# Patient Record
Sex: Female | Born: 1950 | Race: Black or African American | Hispanic: No | Smoking: Former smoker
Health system: Southern US, Community
[De-identification: ages and names within clinical notes are randomized; demographics above are authoritative.]

## PROBLEM LIST (undated history)

## (undated) DIAGNOSIS — I10 Essential (primary) hypertension: Secondary | ICD-10-CM

## (undated) DIAGNOSIS — R519 Headache, unspecified: Secondary | ICD-10-CM

## (undated) DIAGNOSIS — R51 Headache: Secondary | ICD-10-CM

## (undated) DIAGNOSIS — C719 Malignant neoplasm of brain, unspecified: Secondary | ICD-10-CM

## (undated) DIAGNOSIS — J45909 Unspecified asthma, uncomplicated: Secondary | ICD-10-CM

## (undated) DIAGNOSIS — J42 Unspecified chronic bronchitis: Secondary | ICD-10-CM

## (undated) DIAGNOSIS — R569 Unspecified convulsions: Secondary | ICD-10-CM

## (undated) DIAGNOSIS — Z8489 Family history of other specified conditions: Secondary | ICD-10-CM

## (undated) DIAGNOSIS — E785 Hyperlipidemia, unspecified: Secondary | ICD-10-CM

## (undated) DIAGNOSIS — C50919 Malignant neoplasm of unspecified site of unspecified female breast: Secondary | ICD-10-CM

## (undated) HISTORY — PX: LAPAROSCOPIC CHOLECYSTECTOMY: SUR755

## (undated) HISTORY — PX: BREAST LUMPECTOMY: SHX2

## (undated) HISTORY — PX: APPENDECTOMY: SHX54

## (undated) HISTORY — PX: BREAST BIOPSY: SHX20

## (undated) HISTORY — PX: ABDOMINAL HYSTERECTOMY: SHX81

---

## 1999-07-29 ENCOUNTER — Emergency Department (HOSPITAL_COMMUNITY): Admission: EM | Admit: 1999-07-29 | Discharge: 1999-07-29 | Payer: Self-pay | Admitting: Emergency Medicine

## 1999-07-29 ENCOUNTER — Encounter: Payer: Self-pay | Admitting: Emergency Medicine

## 2000-09-29 ENCOUNTER — Encounter: Payer: Self-pay | Admitting: Emergency Medicine

## 2000-09-29 ENCOUNTER — Emergency Department (HOSPITAL_COMMUNITY): Admission: EM | Admit: 2000-09-29 | Discharge: 2000-09-29 | Payer: Self-pay | Admitting: Emergency Medicine

## 2004-09-21 ENCOUNTER — Emergency Department (HOSPITAL_COMMUNITY): Admission: EM | Admit: 2004-09-21 | Discharge: 2004-09-21 | Payer: Self-pay | Admitting: Emergency Medicine

## 2007-02-05 ENCOUNTER — Ambulatory Visit: Payer: Self-pay | Admitting: Internal Medicine

## 2007-02-16 ENCOUNTER — Emergency Department (HOSPITAL_COMMUNITY): Admission: EM | Admit: 2007-02-16 | Discharge: 2007-02-17 | Payer: Self-pay | Admitting: Podiatry

## 2007-02-19 ENCOUNTER — Ambulatory Visit: Payer: Self-pay | Admitting: Internal Medicine

## 2007-02-22 ENCOUNTER — Ambulatory Visit: Payer: Self-pay | Admitting: *Deleted

## 2007-03-04 DIAGNOSIS — J4489 Other specified chronic obstructive pulmonary disease: Secondary | ICD-10-CM | POA: Insufficient documentation

## 2007-03-04 DIAGNOSIS — J309 Allergic rhinitis, unspecified: Secondary | ICD-10-CM | POA: Insufficient documentation

## 2007-03-04 DIAGNOSIS — J449 Chronic obstructive pulmonary disease, unspecified: Secondary | ICD-10-CM

## 2007-03-04 DIAGNOSIS — I10 Essential (primary) hypertension: Secondary | ICD-10-CM

## 2007-03-05 ENCOUNTER — Ambulatory Visit: Payer: Self-pay | Admitting: Internal Medicine

## 2007-03-05 ENCOUNTER — Encounter (INDEPENDENT_AMBULATORY_CARE_PROVIDER_SITE_OTHER): Payer: Self-pay | Admitting: Family Medicine

## 2007-04-04 ENCOUNTER — Ambulatory Visit: Payer: Self-pay | Admitting: Internal Medicine

## 2007-04-04 LAB — CONVERTED CEMR LAB
ALT: 12 units/L (ref 0–35)
Albumin: 4.5 g/dL (ref 3.5–5.2)
Basophils Absolute: 0 10*3/uL (ref 0.0–0.1)
CO2: 24 meq/L (ref 19–32)
Calcium: 9.7 mg/dL (ref 8.4–10.5)
Chloride: 106 meq/L (ref 96–112)
Cholesterol: 185 mg/dL (ref 0–200)
Lymphocytes Relative: 49 % — ABNORMAL HIGH (ref 12–46)
Lymphs Abs: 2.5 10*3/uL (ref 0.7–4.0)
Neutro Abs: 2.1 10*3/uL (ref 1.7–7.7)
Neutrophils Relative %: 42 % — ABNORMAL LOW (ref 43–77)
Platelets: 230 10*3/uL (ref 150–400)
Potassium: 4.4 meq/L (ref 3.5–5.3)
RDW: 15.5 % (ref 11.5–15.5)
Sodium: 142 meq/L (ref 135–145)
Total Bilirubin: 0.3 mg/dL (ref 0.3–1.2)
Total Protein: 7.5 g/dL (ref 6.0–8.3)
VLDL: 16 mg/dL (ref 0–40)
WBC: 5.1 10*3/uL (ref 4.0–10.5)

## 2007-04-09 ENCOUNTER — Ambulatory Visit: Payer: Self-pay | Admitting: Internal Medicine

## 2007-04-09 ENCOUNTER — Encounter (INDEPENDENT_AMBULATORY_CARE_PROVIDER_SITE_OTHER): Payer: Self-pay | Admitting: Family Medicine

## 2007-05-02 ENCOUNTER — Ambulatory Visit: Payer: Self-pay | Admitting: Family Medicine

## 2007-05-02 ENCOUNTER — Encounter (INDEPENDENT_AMBULATORY_CARE_PROVIDER_SITE_OTHER): Payer: Self-pay | Admitting: Nurse Practitioner

## 2007-05-02 LAB — CONVERTED CEMR LAB
AST: 16 units/L (ref 0–37)
Albumin: 4.7 g/dL (ref 3.5–5.2)
Alkaline Phosphatase: 107 units/L (ref 39–117)
Chloride: 106 meq/L (ref 96–112)
Glucose, Bld: 91 mg/dL (ref 70–99)
Potassium: 4.1 meq/L (ref 3.5–5.3)
Sodium: 141 meq/L (ref 135–145)
Total Protein: 8.3 g/dL (ref 6.0–8.3)

## 2007-07-06 ENCOUNTER — Ambulatory Visit: Payer: Self-pay | Admitting: Internal Medicine

## 2007-07-06 DIAGNOSIS — K219 Gastro-esophageal reflux disease without esophagitis: Secondary | ICD-10-CM | POA: Insufficient documentation

## 2007-10-18 ENCOUNTER — Ambulatory Visit: Payer: Self-pay | Admitting: Family Medicine

## 2008-02-12 ENCOUNTER — Ambulatory Visit: Payer: Self-pay | Admitting: Family Medicine

## 2008-02-12 DIAGNOSIS — Z853 Personal history of malignant neoplasm of breast: Secondary | ICD-10-CM

## 2008-02-12 DIAGNOSIS — F17200 Nicotine dependence, unspecified, uncomplicated: Secondary | ICD-10-CM

## 2008-02-14 ENCOUNTER — Telehealth (INDEPENDENT_AMBULATORY_CARE_PROVIDER_SITE_OTHER): Payer: Self-pay | Admitting: *Deleted

## 2008-02-14 LAB — CONVERTED CEMR LAB
ALT: 10 units/L (ref 0–35)
AST: 17 units/L (ref 0–37)
Albumin: 4.3 g/dL (ref 3.5–5.2)
Basophils Absolute: 0 10*3/uL (ref 0.0–0.1)
Basophils Relative: 0 % (ref 0–1)
Calcium: 9.6 mg/dL (ref 8.4–10.5)
Chloride: 104 meq/L (ref 96–112)
MCHC: 33 g/dL (ref 30.0–36.0)
Neutro Abs: 1.7 10*3/uL (ref 1.7–7.7)
Neutrophils Relative %: 26 % — ABNORMAL LOW (ref 43–77)
Potassium: 4.2 meq/L (ref 3.5–5.3)
RBC: 4.6 M/uL (ref 3.87–5.11)
RDW: 14.7 % (ref 11.5–15.5)
TSH: 1.537 microintl units/mL (ref 0.350–4.50)

## 2008-02-15 ENCOUNTER — Ambulatory Visit (HOSPITAL_COMMUNITY): Admission: RE | Admit: 2008-02-15 | Discharge: 2008-02-15 | Payer: Self-pay | Admitting: Family Medicine

## 2008-02-22 ENCOUNTER — Encounter: Admission: RE | Admit: 2008-02-22 | Discharge: 2008-02-22 | Payer: Self-pay | Admitting: Family Medicine

## 2008-02-28 ENCOUNTER — Encounter (INDEPENDENT_AMBULATORY_CARE_PROVIDER_SITE_OTHER): Payer: Self-pay | Admitting: Family Medicine

## 2008-04-17 ENCOUNTER — Ambulatory Visit: Payer: Self-pay | Admitting: Family Medicine

## 2008-04-17 ENCOUNTER — Encounter (INDEPENDENT_AMBULATORY_CARE_PROVIDER_SITE_OTHER): Payer: Self-pay | Admitting: Family Medicine

## 2008-04-17 DIAGNOSIS — E785 Hyperlipidemia, unspecified: Secondary | ICD-10-CM

## 2008-04-17 LAB — CONVERTED CEMR LAB
Blood in Urine, dipstick: NEGATIVE
Glucose, Urine, Semiquant: NEGATIVE
Ketones, urine, test strip: NEGATIVE
Specific Gravity, Urine: >=1.03
WBC Urine, dipstick: NEGATIVE
pH: 5.5

## 2008-04-18 ENCOUNTER — Telehealth (INDEPENDENT_AMBULATORY_CARE_PROVIDER_SITE_OTHER): Payer: Self-pay | Admitting: Family Medicine

## 2008-04-18 LAB — CONVERTED CEMR LAB
ALT: 10 units/L (ref 0–35)
AST: 17 units/L (ref 0–37)
Alkaline Phosphatase: 72 units/L (ref 39–117)
Calcium: 9.5 mg/dL (ref 8.4–10.5)
Chloride: 107 meq/L (ref 96–112)
Creatinine, Ser: 0.73 mg/dL (ref 0.40–1.20)
LDL Cholesterol: 117 mg/dL — ABNORMAL HIGH (ref 0–99)
Potassium: 4 meq/L (ref 3.5–5.3)
Total CHOL/HDL Ratio: 4.2
VLDL: 19 mg/dL (ref 0–40)

## 2008-05-05 ENCOUNTER — Encounter (INDEPENDENT_AMBULATORY_CARE_PROVIDER_SITE_OTHER): Payer: Self-pay | Admitting: Family Medicine

## 2008-07-03 ENCOUNTER — Ambulatory Visit: Payer: Self-pay | Admitting: Family Medicine

## 2008-07-03 LAB — CONVERTED CEMR LAB
ALT: 10 units/L (ref 0–35)
AST: 15 units/L (ref 0–37)
Alkaline Phosphatase: 67 units/L (ref 39–117)
Cholesterol: 177 mg/dL (ref 0–200)
Indirect Bilirubin: 0.3 mg/dL (ref 0.0–0.9)
Total Protein: 7.8 g/dL (ref 6.0–8.3)
Triglycerides: 81 mg/dL (ref <150)
VLDL: 16 mg/dL (ref 0–40)

## 2008-07-04 ENCOUNTER — Encounter (INDEPENDENT_AMBULATORY_CARE_PROVIDER_SITE_OTHER): Payer: Self-pay | Admitting: Internal Medicine

## 2008-07-26 ENCOUNTER — Emergency Department (HOSPITAL_COMMUNITY): Admission: EM | Admit: 2008-07-26 | Discharge: 2008-07-26 | Payer: Self-pay | Admitting: Family Medicine

## 2008-08-11 ENCOUNTER — Telehealth (INDEPENDENT_AMBULATORY_CARE_PROVIDER_SITE_OTHER): Payer: Self-pay | Admitting: Family Medicine

## 2008-11-01 ENCOUNTER — Emergency Department (HOSPITAL_COMMUNITY): Admission: EM | Admit: 2008-11-01 | Discharge: 2008-11-01 | Payer: Self-pay | Admitting: Family Medicine

## 2009-03-03 ENCOUNTER — Encounter: Admission: RE | Admit: 2009-03-03 | Discharge: 2009-03-03 | Payer: Self-pay | Admitting: Internal Medicine

## 2009-05-04 ENCOUNTER — Ambulatory Visit: Payer: Self-pay | Admitting: Physician Assistant

## 2009-05-06 ENCOUNTER — Ambulatory Visit: Payer: Self-pay | Admitting: Physician Assistant

## 2009-05-15 ENCOUNTER — Ambulatory Visit: Payer: Self-pay | Admitting: Physician Assistant

## 2009-05-15 LAB — CONVERTED CEMR LAB
AST: 18 units/L (ref 0–37)
Albumin: 4.5 g/dL (ref 3.5–5.2)
Alkaline Phosphatase: 81 units/L (ref 39–117)
BUN: 13 mg/dL (ref 6–23)
Basophils Absolute: 0 10*3/uL (ref 0.0–0.1)
Basophils Relative: 1 % (ref 0–1)
Lymphocytes Relative: 64 % — ABNORMAL HIGH (ref 12–46)
MCHC: 31.7 g/dL (ref 30.0–36.0)
Neutro Abs: 1.5 10*3/uL — ABNORMAL LOW (ref 1.7–7.7)
Nitrite: NEGATIVE
Platelets: 222 10*3/uL (ref 150–400)
Potassium: 4.6 meq/L (ref 3.5–5.3)
RDW: 15.2 % (ref 11.5–15.5)
Sodium: 138 meq/L (ref 135–145)
Specific Gravity, Urine: 1.02
Total Bilirubin: 0.3 mg/dL (ref 0.3–1.2)
Total Protein: 7.5 g/dL (ref 6.0–8.3)
Urobilinogen, UA: 0.2
WBC Urine, dipstick: NEGATIVE
pH: 6

## 2009-05-18 ENCOUNTER — Encounter: Payer: Self-pay | Admitting: Physician Assistant

## 2009-08-19 ENCOUNTER — Telehealth: Payer: Self-pay | Admitting: Physician Assistant

## 2009-11-16 ENCOUNTER — Ambulatory Visit: Payer: Self-pay | Admitting: Physician Assistant

## 2009-11-17 ENCOUNTER — Encounter: Payer: Self-pay | Admitting: Physician Assistant

## 2009-11-17 LAB — CONVERTED CEMR LAB
Albumin: 4.5 g/dL (ref 3.5–5.2)
Alkaline Phosphatase: 69 units/L (ref 39–117)
BUN: 15 mg/dL (ref 6–23)
CO2: 26 meq/L (ref 19–32)
Cholesterol: 190 mg/dL (ref 0–200)
Glucose, Bld: 73 mg/dL (ref 70–99)
HDL goal, serum: 40 mg/dL
HDL: 47 mg/dL (ref >39)
LDL Cholesterol: 120 mg/dL — ABNORMAL HIGH (ref 0–99)
Potassium: 4.6 meq/L (ref 3.5–5.3)
Sodium: 141 meq/L (ref 135–145)
Total Protein: 7.4 g/dL (ref 6.0–8.3)
Triglycerides: 113 mg/dL (ref <150)

## 2010-02-21 ENCOUNTER — Encounter: Payer: Self-pay | Admitting: Family Medicine

## 2010-03-02 NOTE — Assessment & Plan Note (Signed)
Summary: HTN; smoking   Vital Signs:  Patient profile:   60 year old female Menstrual status:  postmenopausal Weight:      141.8 pounds BMI:     27.79 Temp:     97.5 degrees F oral Pulse rate:   72 / minute Pulse rhythm:   regular Resp:     16 per minute BP sitting:   106 / 66  (left arm)  Vitals Entered By: Levon Hedger (November 16, 2009 9:55 AM) CC: follow-up visit, Hypertension Management Is Patient Diabetic? No Pain Assessment Patient in pain? no       Does patient need assistance? Functional Status Self care Ambulation Normal     Menstrual Status postmenopausal Last PAP Result NEGATIVE FOR INTRAEPITHELIAL LESIONS OR MALIGNANCY.   Primary Care Jorie Zee:  Tereso Newcomer PA-C  CC:  follow-up visit and Hypertension Management.  History of Present Illness: Here for f/u.  Smoking:  Never tried Wellbutrin.  Still smoking.  HLP:  Needs labs today.  Still taking pravastatin without problems.  Hypertension History:      She complains of headache, but denies chest pain, dyspnea with exertion, and syncope.        Positive major cardiovascular risk factors include female age 35 years old or older, hyperlipidemia, hypertension, and current tobacco user.  Negative major cardiovascular risk factors include negative family history for ischemic heart disease.     Medications Prior to Update: 1)  Aspirin Adult Low Strength 81 Mg Tbec (Aspirin) .... Take One Tablet By Mouth Daily 2)  Lisinopril-Hydrochlorothiazide 10-12.5 Mg Tabs (Lisinopril-Hydrochlorothiazide) .... Take One Tablet By Mouth Daily 3)  Atenolol 50 Mg Tabs (Atenolol) .... Take One Tablet By Mouth Daily 4)  Flonase 50 Mcg/act Susp (Fluticasone Propionate) .... One Squirt in Each Nostril Prn 5)  Pravastatin Sodium 20 Mg Tabs (Pravastatin Sodium) .Marland Kitchen.. 1 Tab By Mouth Daily 6)  Wellbutrin Xl 150 Mg Xr24h-Tab (Bupropion Hcl) .... Take 1 Tablet By Mouth Each Day X 1 Week Then Increase To 2 Tablets (Ok To Substitute  Sr) 7)  Allegra 180 Mg Tabs (Fexofenadine Hcl) .... Take 1 Tablet By Mouth Once A Day As Needed For Allergies  Current Medications (verified): 1)  Aspirin Adult Low Strength 81 Mg Tbec (Aspirin) .... Take One Tablet By Mouth Daily 2)  Lisinopril-Hydrochlorothiazide 10-12.5 Mg Tabs (Lisinopril-Hydrochlorothiazide) .... Take One Tablet By Mouth Daily 3)  Atenolol 50 Mg Tabs (Atenolol) .... Take One Tablet By Mouth Daily 4)  Flonase 50 Mcg/act Susp (Fluticasone Propionate) .... One Squirt in Each Nostril Prn 5)  Pravastatin Sodium 20 Mg Tabs (Pravastatin Sodium) .Marland Kitchen.. 1 Tab By Mouth Daily 6)  Wellbutrin Xl 150 Mg Xr24h-Tab (Bupropion Hcl) .... Take 1 Tablet By Mouth Each Day X 1 Week Then Increase To 2 Tablets (Ok To Substitute Sr) 7)  Allegra 180 Mg Tabs (Fexofenadine Hcl) .... Take 1 Tablet By Mouth Once A Day As Needed For Allergies  Allergies (verified): No Known Drug Allergies  Physical Exam  General:  alert, well-developed, and well-nourished.   Head:  normocephalic and atraumatic.   Neck:  supple.   Lungs:  normal breath sounds.   Heart:  normal rate and regular rhythm.   Abdomen:  soft.   Neurologic:  alert & oriented X3 and cranial nerves II-XII intact.   Psych:  normally interactive.     Impression & Recommendations:  Problem # 1:  HYPERLIPIDEMIA (ICD-272.4)  check FLP today  Her updated medication list for this problem includes:  Pravastatin Sodium 20 Mg Tabs (Pravastatin sodium) .Marland Kitchen... 1 tab by mouth daily  Orders: T-Comprehensive Metabolic Panel [phone]) T-Lipid Profile (09811-91478)  Problem # 2:  HYPERTENSION (ICD-401.9) controlled  Her updated medication list for this problem includes:    Lisinopril-hydrochlorothiazide 10-12.5 Mg Tabs (Lisinopril-hydrochlorothiazide) .Marland Kitchen... Take one tablet by mouth daily    Atenolol 50 Mg Tabs (Atenolol) .Marland Kitchen... Take one tablet by mouth daily  Orders: T-Comprehensive Metabolic Panel ([id number])  Problem # 3:  TOBACCO  ABUSE (ICD-305.1) never tried wellbutrin tried on her own wants to try again but needs RX will [facility]  Complete Medication List: 1)  Aspirin Adult Low Strength 81 Mg Tbec (Aspirin) .... Take one tablet by mouth daily 2)  Lisinopril-hydrochlorothiazide 10-12.5 Mg Tabs (Lisinopril-hydrochlorothiazide) .... Take one tablet by mouth daily 3)  Atenolol 50 Mg Tabs (Atenolol) .... Take one tablet by mouth daily 4)  Flonase 50 Mcg/act Susp (Fluticasone propionate) .... One squirt in each nostril prn 5)  Pravastatin Sodium 20 Mg Tabs (Pravastatin sodium) .Marland Kitchen.. 1 tab by mouth daily 6)  Wellbutrin Xl 150 Mg Xr24h-tab (Bupropion hcl) .... Take 1 tablet by mouth each day x 1 week then increase to 2 tablets (ok to substitute sr) 7)  Allegra 180 Mg Tabs (Fexofenadine hcl) .... Take 1 tablet by mouth once a day as needed for allergies  Other Orders: Flu Vaccine 28yrs + (57846) Admin 1st Vaccine (96295)  Hypertension Assessment/Plan:      The patient's hypertensive risk group is category B: At least one risk factor (excluding diabetes) with no target organ damage.  Her calculated 10 year risk of coronary heart disease is 9 %.  Today's blood pressure is 106/66.  Her blood pressure goal is < 140/90.  Patient Instructions: 1)  Flu shot today. 2)  Schedule CPP in February 2012. 3)  Stop smoking tips: Choose a quit date. Cut down before the quit date. Decide what you will do as a substitute when you feel the urge to smoke(gum, toothpick, exercise).  4)  If you decide to, start the Wellbutrin about 2 months before your quit date.  Continue it for about 3-4 months after you quit.  Remember to taper off the Wellbutrin when you stop:  Take once daily for 3 days, then every other day for 3 doses, then stop. Prescriptions: WELLBUTRIN XL 150 MG XR24H-TAB (BUPROPION HCL) Take 1 tablet by mouth each day x 1 week then increase to 2 tablets (ok to substitute SR)  #60 x 3   Entered and Authorized by:   Tereso Newcomer PA-C   Signed by:   Tereso Newcomer PA-C on 11/16/2009   Method used:   Faxed to ...       [facility] (retail)       [address] [place], [place]  [address]       Ph: [phone]       Fax: [phone]   RxID:   [id number]    Orders Added: 1)  Est. Patient Level III [99213] 2)  T-Comprehensive Metabolic Panel [80053-22900] 3)  T-Lipid Profile [80061-22930] 4)  Flu Vaccine 9yrs + [90658] 5)  Admin 1st Vaccine [18841]   Immunizations Administered:  Influenza Vaccine # 1:    Vaccine Type: Fluvax 3+    Site: left deltoid    Mfr: GlaxoSmithKline    Dose: 0.5 ml    Route: IM    Given by: Levon Hedger  Exp. Date: 07/31/2010    Lot #: [id number]    VIS given: 08/25/09 version given November 16, 2009.  Flu Vaccine Consent Questions:    Do you have a history of severe allergic reactions to this vaccine? no    Any prior history of allergic reactions to egg and/or gelatin? no    Do you have a sensitivity to the preservative Thimersol? no    Do you have a past history of Guillan-Barre Syndrome? no    Do you currently have an acute febrile illness? no    Have you ever had a severe reaction to latex? no    Vaccine information given and explained to patient? yes    Are you currently pregnant? no   ndc  [phone]  Immunizations Administered:  Influenza Vaccine # 1:    Vaccine Type: Fluvax 3+    Site: left deltoid    Mfr: GlaxoSmithKline    Dose: 0.5 ml    Route: IM    Given by: Levon Hedger    Exp. Date: 07/31/2010    Lot #: [id number]    VIS given: 08/25/09 version given November 16, 2009.

## 2010-03-02 NOTE — Assessment & Plan Note (Signed)
Summary: HEALTH ASSESSMENT//SS   Vital Signs:  Patient profile:   60 year old female Height:      60 inches Weight:      142 pounds BMI:     27.83 O2 Sat:      99 % Temp:     97.9 degrees F oral Pulse rate:   76 / minute Pulse rhythm:   regular Resp:     18 per minute BP sitting:   127 / 73  (left arm) Cuff size:   regular  Vitals Entered By: [place] Shannon (May 15, 2009 11:57 AM) CC: Hypertension Management Is Patient Diabetic? No Pain Assessment Patient in pain? no       Does patient need assistance? Functional Status Self care Ambulation Normal  Vision Screening:Left eye w/o correction: 20 / 20 Right Eye w/o correction: 20 / 20-1 Both eyes w/o correction:  20/ 15-2        Vision Entered By: Armenia Shannon (May 15, 2009 12:04 PM)  Hearing Screen  20db HL: Left  500 hz: 40db 1000 hz: 40db 2000 hz: 20db 4000 hz: 20db Right  500 hz: 25db 1000 hz: 25db 2000 hz: 20db 4000 hz: 20db   Hearing Testing Entered By: [place] Shannon (May 15, 2009 12:04 PM)   Primary Care Provider:  Tereso Newcomer PA-C  CC:  Hypertension Management.  History of Present Illness: Here for physical form to be completed for her new job.  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 Previous patient of Dr. Barbaraann Barthel. Denies any complaints. Wants to try Wellbutrin again to help her quit smoking. She had SEs to Chantix. She smokes about 1/2 ppd now.   Hypertension History:      She denies headache, chest pain, dyspnea with exertion, and syncope.        Positive major cardiovascular risk factors include female age 58 years old or older, hyperlipidemia, hypertension, and current tobacco user.  Negative major cardiovascular risk factors include negative family history for ischemic heart disease.    Habits & Providers  Alcohol-Tobacco-Diet     Tobacco Status: current     Cigarette Packs/Day: 0.5  Problems Prior to Update: 1)  Examination, Routine Medical  (ICD-V70.0) 2)   Hyperlipidemia  (ICD-272.4) 3)  Personal History of Malignant Neoplasm of Breast  (ICD-V10.3) 4)  Gerd  (ICD-530.81) 5)  Allergic Rhinitis  (ICD-477.9) 6)  Tobacco Abuse  (ICD-305.1) 7)  COPD  (ICD-496) 8)  Hypertension  (ICD-401.9) 9)  Unspecified Breast Screening  (ICD-V76.10)  Current Medications (verified): 1)  Aspirin Adult Low Strength 81 Mg Tbec (Aspirin) .... Take One Tablet By Mouth Daily 2)  Lisinopril-Hydrochlorothiazide 10-12.5 Mg Tabs (Lisinopril-Hydrochlorothiazide) .... Take One Tablet By Mouth Daily 3)  Atenolol 50 Mg Tabs (Atenolol) .... Take One Tablet By Mouth Daily 4)  Flonase 50 Mcg/act Susp (Fluticasone Propionate) .... One Squirt in Each Nostril Prn 5)  Famotidine 20 Mg Tabs (Famotidine) .... Take 1 Tablet By Mouth Every 12 Hours As Needed For Indigestion/acid Reflux 6)  Pravastatin Sodium 20 Mg Tabs (Pravastatin Sodium) .Marland Kitchen.. 1 Tab By Mouth Daily 7)  Wellbutrin Xl 150 Mg Xr24h-Tab (Bupropion Hcl) .... Take 1 Tablet By Mouth Each Day X 1 Week Then Increase To 2 Tablets (Ok To Substitute Sr) 8)  Allegra 180 Mg Tabs (Fexofenadine Hcl) .... Take 1 Tablet By Mouth Once A Day As Needed For Allergies  Allergies (verified): No Known Drug Allergies  Past History:  Past Medical History: Last updated: 02/12/2008 Hypertension (03/04/2007)  COPD (03/04/2007) GERD (ICD-530.81) ALLERGIC RHINITIS (ICD-477.9) TOBACCO ABUSE (ICD-305.1)  Past Surgical History: Last updated: 02/12/2008 Appendectomy...age 46 yrs Hysterectomy(partial)...1992 for "fibroids" s/p right breast lumpectomy...1991 and took tamoxifen x 5 years. Cholecystectomy... ~1992  Social History: Packs/Day:  0.5  Review of Systems      See HPI GI:  Denies bloody stools. GU:  Denies hematuria.  Physical Exam  General:  alert, well-developed, and well-nourished.   Head:  normocephalic and atraumatic.   Eyes:  pupils equal, pupils round, and pupils reactive to light.   Mouth:  pharynx pink and moist.     tonsils 2+ bilat  Neck:  supple, no thyromegaly, no carotid bruits, and no cervical lymphadenopathy.   Lungs:  normal breath sounds, no crackles, and no wheezes.   Heart:  normal rate, regular rhythm, and no murmur.   Abdomen:  soft and non-tender.   Neurologic:  alert & oriented X3 and cranial nerves II-XII intact.   Psych:  normally interactive.     Impression & Recommendations:  Problem # 1:  TOBACCO ABUSE (ICD-305.1) wants to start back on Wellbutrin to help quit went over quit plan she tried chantix in the past and had side effects  Problem # 2:  HYPERLIPIDEMIA (ICD-272.4)  not fasting get FLP next visit  Her updated medication list for this problem includes:    Pravastatin Sodium 20 Mg Tabs (Pravastatin sodium) .Marland Kitchen... 1 tab by mouth daily  Orders: T-Comprehensive Metabolic Panel ([id number])  Problem # 3:  HYPERTENSION (ICD-401.9)  controlled  Her updated medication list for this problem includes:    Lisinopril-hydrochlorothiazide 10-12.5 Mg Tabs (Lisinopril-hydrochlorothiazide) .Marland Kitchen... Take one tablet by mouth daily    Atenolol 50 Mg Tabs (Atenolol) .Marland Kitchen... Take one tablet by mouth daily  Orders: T-Comprehensive Metabolic Panel [phone]) T-CBC w/Diff ([id number]) T-Urinalysis (01027-25366)  Problem # 4:  EXAMINATION, ROUTINE MEDICAL (ICD-V70.0)  filled out form for school needs CPP in 04/2010 mammo up to date and vaccines are up to date  Orders: T-CBC w/Diff ([id number]) T-HIV Antibody  (Reflex) ([id number]) T-TSH (29518-84166)  Complete Medication List: 1)  Aspirin Adult Low Strength 81 Mg Tbec (Aspirin) .... Take one tablet by mouth daily 2)  Lisinopril-hydrochlorothiazide 10-12.5 Mg Tabs (Lisinopril-hydrochlorothiazide) .... Take one tablet by mouth daily 3)  Atenolol 50 Mg Tabs (Atenolol) .... Take one tablet by mouth daily 4)  Flonase 50 Mcg/act Susp (Fluticasone propionate) .... One squirt in each nostril prn 5)  Pravastatin Sodium 20 Mg Tabs  (Pravastatin sodium) .Marland Kitchen.. 1 tab by mouth daily 6)  Wellbutrin Xl 150 Mg Xr24h-tab (Bupropion hcl) .... Take 1 tablet by mouth each day x 1 week then increase to 2 tablets (ok to substitute sr) 7)  Allegra 180 Mg Tabs (Fexofenadine hcl) .... Take 1 tablet by mouth once a day as needed for allergies  Hypertension Assessment/Plan:      The patient's hypertensive risk group is category B: At least one risk factor (excluding diabetes) with no target organ damage.  Her calculated 10 year risk of coronary heart disease is 15 %.  Today's blood pressure is 127/73.  Her blood pressure goal is < 140/90.   Patient Instructions: 1)  Please schedule a follow-up appointment in 6 months with Dashanti Burr for blood pressure.  Come fasting to your next appointment for labs.  Do not eat or drink anything after midnight the night before except water. 2)  Tobacco is very bad for your health and your loved ones ! You should stop smoking !  3)  Stop smoking tips: Choose a quit date. Cut down before the quit date. Decide what you will do as a substitute when you feel the urge to smoke(gum, toothpick, exercise).  Prescriptions: WELLBUTRIN XL 150 MG XR24H-TAB (BUPROPION HCL) Take 1 tablet by mouth each day x 1 week then increase to 2 tablets (ok to substitute SR)  #60 x 3   Entered and Authorized by:   Tereso Newcomer PA-C   Signed by:   Tereso Newcomer PA-C on 05/15/2009   Method used:   Print then Give to Patient   RxID:   [id number]   Laboratory Results   Urine Tests    Routine Urinalysis   Glucose: negative   (Normal Range: Negative) Bilirubin: negative   (Normal Range: Negative) Ketone: negative   (Normal Range: Negative) Spec. Gravity: 1.020   (Normal Range: 1.003-1.035) Blood: negative   (Normal Range: Negative) pH: 6.0   (Normal Range: 5.0-8.0) Protein: negative   (Normal Range: Negative) Urobilinogen: 0.2   (Normal Range: 0-1) Nitrite: negative   (Normal Range: Negative) Leukocyte Esterace: negative    (Normal Range: Negative)

## 2010-03-02 NOTE — Letter (Signed)
Summary: HEALTH EXAM CERTIFICATE/[employer]  HEALTH EXAM CERTIFICATE/[employer]   Imported By: Arta Bruce 07/15/2009 12:49:23  _____________________________________________________________________  External Attachment:    Type:   Image     Comment:   External Document

## 2010-03-02 NOTE — Progress Notes (Signed)
Summary: Try to increase pravastatin dose  Phone Note Outgoing Call   Summary of Call: CMA to call patient. 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 Initial call taken by: Beverley Fiedler MD,  April 18, 2008 11:44 AM  Follow-up for Phone Call        spoke with pt and informed her of her cholestrol and let her know that she needs to make fasting labs appt  eight weeks after she starts taking the two tabs per day Follow-up by: Armenia Shannon,  April 18, 2008 2:46 PM

## 2010-03-02 NOTE — Letter (Signed)
Summary: *HSN Results Follow up  [facility]  [address] [place], [place] [address]   Phone: [phone]  Fax: [phone]      11/17/2009   [facility] [address] [place], [place]  [address]   Dear  Ms. Rose [name],                            ____S.Drinkard,FNP   ____D. Gore,FNP       ____B. McPherson,MD   ____V. Rankins,MD    ____E. Mulberry,MD    ____N. Daphine Deutscher, FNP  ____D. Reche Dixon, MD    ____K. Philipp Deputy, MD    __x__S. Alben Spittle, PA-C     This letter is to inform you that your recent test(s):  _______Pap Smear    ___x____Lab Test     _______X-ray    ___x____ is within acceptable limits  _______ requires a medication change  _______ requires a follow-up lab visit  _______ requires a follow-up visit with your provider   Comments:  Liver and kidney function is normal.  Your cholesterol numbers are at goal.  Continue all your current medications.       _________________________________________________________ If you have any questions, please contact our office                     Sincerely,  Tereso Newcomer PA-C [facility]

## 2010-03-02 NOTE — Assessment & Plan Note (Signed)
Summary: 2 MONTH FU FOR CPP/////KT   Vital Signs:  Patient profile:   60 year old female Height:      60 inches Weight:      148 pounds BMI:     29.01 Temp:     97.9 degrees F oral Pulse rate:   88 / minute Pulse rhythm:   regular Resp:     20 per minute BP sitting:   120 / 78  (left arm) Cuff size:   regular  Vitals Entered By: Mikey College CMAk (April 17, 2008 9:24 AM)  History of Present Illness: Here for CPP. Had f/u compression views of breast and all benign. Recheck annually. No sexually active in years. s/p partial hysterectomy. Has patch of dry,itchy skin on outside of her right upper arm.Resolves in summer and re-occurs in winter and has done so for years..  Tolerated slow increase in pravastatin worked and patient taking a full pravastatin tablet daily. Patient wants to quit smoking but had bad dreams when taking chantix.  No prior h/o colonoscopy and benefits d/w her.She understands it is not covered by [facility] but desires referral.  Allergies: No Known Drug Allergies  Past History:  Past Medical History:    Hypertension (03/04/2007)    COPD (03/04/2007)    GERD (ICD-530.81)    ALLERGIC RHINITIS (ICD-477.9)    TOBACCO ABUSE (ICD-305.1)     (02/12/2008)  Family History:    No colon cancer  Physical Exam  General:  Well-developed,well-nourished,in no acute distress; alert,appropriate and cooperative throughout examination Head:  Normocephalic and atraumatic without obvious abnormalities. No apparent alopecia or balding. Eyes:  pupils equal, pupils round, pupils reactive to light, and no injection.   Ears:  External ear exam shows no significant lesions or deformities.  Otoscopic examination reveals clear canals, tympanic membranes are intact bilaterally without bulging, retraction, inflammation or discharge. Hearing is grossly normal bilaterally. Nose:  External nasal examination shows no deformity or inflammation. Nasal mucosa are pink and moist without  lesions or exudates. Mouth:  Oral mucosa and oropharynx without lesions or exudates.  Teeth in good repair.pharynx pink and moist, no erythema, and no exudates.   Neck:  supple, no thyromegaly, no carotid bruits, and no cervical lymphadenopathy.   Breasts:  No mass, nodules, thickening, tenderness, bulging, retraction, inflamation, nipple discharge or skin changes noted.   Lungs:  Normal respiratory effort, chest expands symmetrically. Lungs are clear to auscultation, no crackles or wheezes. Heart:  Normal rate and regular rhythm. S1 and S2 normal without gallop, murmur, click, rub or other extra sounds. Abdomen:  Bowel sounds positive,abdomen soft and non-tender without masses, organomegaly or hernias noted. Rectal:  No external abnormalities noted. Normal sphincter tone. No rectal masses or tenderness. Guiac neg/control posit. Genitalia:  Pelvic Exam:        External: normal female genitalia without lesions or masses        Vagina: normal without lesions or masses        Cervix: absent        Adnexa: normal bimanual exam without masses or fullness        Uterus: absent        Pap smear: performed(vaginal wall smear). Msk:  No deformity or scoliosis noted of thoracic or lumbar spine.   Extremities:  No CCE Neurologic:  alert & oriented X3 and cranial nerves II-XII intact grossly.  Skin:  Right Upper arm:4x3 inch patch of dry skin c/w eczema. Cervical Nodes:  No lymphadenopathy noted Axillary Nodes:  No  palpable lymphadenopathy Psych:  Oriented X3, memory intact for recent and remote, normally interactive, good eye contact, not anxious appearing, and not depressed appearing.      Impression & Recommendations:  Problem # 1:  EXAMINATION, ROUTINE MEDICAL (ICD-V70.0) Plans to quit smoking. Has eye screening through [facility]  ~ 1 year ago. Desires colonoscopy referral. Td booster is UTD. Had mammogram. Recent labs d/w patient.  Orders: Thin Prep Pap (52841) Pap Smear, Thin  Prep ( Collection of) (L2440) Hemoccult Guaiac-1 spec.(in office) (10272) Hemoccult Cards -3 specimans (take home) (82270) UA Dipstick w/o Micro (automated)  (81003)  Problem # 2:  HYPERLIPIDEMIA (ICD-272.4)  Her updated medication list for this problem includes:    Pravastatin Sodium 20 Mg Tabs (Pravastatin sodium) .Marland Kitchen... 1 tab by mouth daily  Orders: T-Lipid Profile (53664-40347) T-Comprehensive Metabolic Panel (42595-63875)  Problem # 3:  HYPERTENSION (ICD-401.9) At goal. Her updated medication list for this problem includes:    Lisinopril-hydrochlorothiazide 10-12.5 Mg Tabs (Lisinopril-hydrochlorothiazide) .Marland Kitchen... Take one tablet by mouth daily    Atenolol 50 Mg Tabs (Atenolol) .Marland Kitchen... Take one tablet by mouth daily  Problem # 4:  TOBACCO ABUSE (ICD-305.1) Pt wants to try wellbutrin for tobacco cessation.  Complete Medication List: 1)  Aspirin Adult Low Strength 81 Mg Tbec (Aspirin) .... Take one tablet by mouth daily 2)  Lisinopril-hydrochlorothiazide 10-12.5 Mg Tabs (Lisinopril-hydrochlorothiazide) .... Take one tablet by mouth daily 3)  Atenolol 50 Mg Tabs (Atenolol) .... Take one tablet by mouth daily 4)  Flonase 50 Mcg/act Susp (Fluticasone propionate) .... One squirt in each nostril prn 5)  Famotidine 20 Mg Tabs (Famotidine) .... Take 1 tablet by mouth every 12 hours as needed for indigestion/acid reflux 6)  Pravastatin Sodium 20 Mg Tabs (Pravastatin sodium) .Marland Kitchen.. 1 tab by mouth daily 7)  Wellbutrin Xl 150 Mg Xr24h-tab (Bupropion hcl) .... Take 1 tablet by mouth each day x 1 week then increase to 2 tablets (ok to substitute sr) 8)  Allegra 180 Mg Tabs (Fexofenadine hcl) .... Take 1 tablet by mouth once a day as needed for allergies  Other Orders: Gastroenterology Referral (GI)  Patient Instructions: 1)  Please schedule a follow-up appointment as needed .  Prescriptions: ALLEGRA 180 MG TABS (FEXOFENADINE HCL) Take 1 tablet by mouth once a day as needed for allergies  #30 x  4   Entered and Authorized by:   Beverley Fiedler MD   Signed by:   Beverley Fiedler MD on 04/17/2008   Method used:   Print then Give to Patient   RxID:   [id number] FLONASE 50 MCG/ACT SUSP (FLUTICASONE PROPIONATE) one squirt in each nostril prn  #1 x 5   Entered and Authorized by:   Beverley Fiedler MD   Signed by:   Beverley Fiedler MD on 04/17/2008   Method used:   Print then Give to Patient   RxID:   [id number] WELLBUTRIN XL 150 MG XR24H-TAB (BUPROPION HCL) Take 1 tablet by mouth each day x 1 week then increase to 2 tablets (ok to substitute SR)  #60 x 3   Entered and Authorized by:   Beverley Fiedler MD   Signed by:   Beverley Fiedler MD on 04/17/2008   Method used:   Print then Give to Patient   RxID:   [id number]   Laboratory Results   Urine Tests    Routine Urinalysis   Color: yellow Appearance: Clear Glucose: negative   (Normal Range: Negative) Bilirubin: negative   (Normal Range: Negative) Ketone: negative   (  Normal Range: Negative) Spec. Gravity: >=1.030   (Normal Range: 1.003-1.035) Blood: negative   (Normal Range: Negative) pH: 5.5   (Normal Range: 5.0-8.0) Protein: 30   (Normal Range: Negative) Urobilinogen: 0.2   (Normal Range: 0-1) Nitrite: negative   (Normal Range: Negative) Leukocyte Esterace: negative   (Normal Range: Negative)

## 2010-03-02 NOTE — Progress Notes (Signed)
Summary: Query re refill for Pravastatin  Phone Note Outgoing Call   Summary of Call: She hasn't had an FLP since 07/2008.  Do you want me to refill her pravastatin?  She was in here to see you in April. Initial call taken by: Dutch Quint RN,  August 19, 2009 3:21 PM  Follow-up for Phone Call        will fill for 6 mos she is supposed to come fasting in Oct for FLP with routine f/u visit  Follow-up by: Tereso Newcomer PA-C,  August 19, 2009 4:45 PM

## 2010-03-02 NOTE — Letter (Signed)
Summary: *HSN Results Follow up  [facility]  [address] [place], [place] [address]   Phone: [phone]  Fax: [phone]      05/18/2009   [facility] [address] [place], [place]  [address]   Dear  Ms. Kareena Damon,                            ____S.Drinkard,FNP   ____D. Gore,FNP       ____B. McPherson,MD   ____V. Rankins,MD    ____E. Mulberry,MD    ____N. Daphine Deutscher, FNP  ____D. Reche Dixon, MD    ____K. Philipp Deputy, MD    __x__S. Alben Spittle, PA-C     This letter is to inform you that your recent test(s):  _______Pap Smear    ____x___Lab Test     _______X-ray    ___x____ is within acceptable limits  _______ requires a medication change  _______ requires a follow-up lab visit  _______ requires a follow-up visit with your provider   Comments:       _________________________________________________________ If you have any questions, please contact our office                     Sincerely,  Tereso Newcomer PA-C [facility]

## 2010-03-02 NOTE — Assessment & Plan Note (Signed)
Summary: HTN/////KT   Vital Signs:  Patient Profile:   60 Years Old Female Height:     59.75 inches Weight:      146 pounds BMI:     28.86 BSA:     1.63 Temp:     98.2 degrees F oral Pulse rate:   88 / minute Pulse rhythm:   regular Resp:     20 per minute BP sitting:   140 / 88  (left arm) Cuff size:   regular  Pt. in pain?   no  Vitals Entered By: Gaylyn Cheers RN (February 12, 2008 11:54 AM)              Is Patient Diabetic? No  Does patient need assistance? Functional Status Self care Ambulation Normal     Chief Complaint:  F/U HTN; currently complains of an upset stomach-ate [place] [employer] last night.  History of Present Illness: Here to establish care at [facility] site. Transfer from [facility] [facility] (due to location near her home). Paper chart reviewed and EMR preloaded. Needs refills on antihypertensive meds. Still smokes. No recent PFTs...denies chronic breathing problems or cough. Had been on pravastatin for LDL of 126 but pt says she was told to stop it as LDL wasn't "too bad" and she felt it made her legs cramp... Has occasional indigestion when eats spicy foods but not a frequent problem and pt says she doesn't take the protonix regularly...    Current Allergies: No known allergies   Past Medical History:    Hypertension (03/04/2007)    COPD (03/04/2007)    GERD (ICD-530.81)    ALLERGIC RHINITIS (ICD-477.9)    TOBACCO ABUSE (ICD-305.1)          Past Surgical History:    Appendectomy...age 10 yrs    Hysterectomy(partial)...1992 for "fibroids"    s/p right breast lumpectomy...1991 and took tamoxifen x 5 years.    Cholecystectomy... ~1992   Social History:    Occupation:Previously in administration/finances    Divorced    Current Smoker    Alcohol use-no    Drug use-no   Risk Factors:  Tobacco use:  current    Year started:  80's    Cigarettes:  Yes -- 1/2 pack(s) per day    Counseled to quit/cut down tobacco use:  yes Drug use:  no HIV  high-risk behavior:  no Caffeine use:  2 drinks per day Alcohol use:  no Exercise:  no Seatbelt use:  100 % Sun Exposure:  occasionally  Family History Risk Factors:    Family History of MI in females < 21 years old:  no    Family History of MI in males < 50 years old:  no    Physical Exam  General:     Well-developed,well-nourished,in no acute distress; alert,appropriate and cooperative throughout examination Lungs:     Normal respiratory effort, chest expands symmetrically. Lungs are clear to auscultation, no crackles or wheezes. Heart:     Normal rate and regular rhythm. S1 and S2 normal without gallop, murmur, click, rub or other extra sounds.    Impression & Recommendations:  Problem # 1:  HYPERTENSION (ICD-401.9)  Her updated medication list for this problem includes:    Lisinopril-hydrochlorothiazide 10-12.5 Mg Tabs (Lisinopril-hydrochlorothiazide) .Marland Kitchen... Take one tablet by mouth daily    Atenolol 50 Mg Tabs (Atenolol) .Marland Kitchen... Take one tablet by mouth daily  Orders: T-General Health Panel (CBCD, CMP, TSH) ([address]) T-Lipid Profile (28413-24401)   Problem # 2:  COPD (ICD-496) Recommended tobacco  cessation. Consider baseline PFTs at f/u appt. Pt got H1N1 FLU shot in community. She got seasonal FLU shot today. She reports that she tried chantix in past but it gave her bad dreams...  Problem # 3:  GERD (ICD-530.81) Switch to just intermittent famotidine. The following medications were removed from the medication list:    Protonix 40 Mg Tbec (Pantoprazole sodium) .Marland Kitchen... Take one tablet by mouth daily  Her updated medication list for this problem includes:    Famotidine 20 Mg Tabs (Famotidine) .Marland Kitchen... Take 1 tablet by mouth every 12 hours as needed for indigestion/acid reflux   Problem # 4:  PERSONAL HISTORY OF MALIGNANT NEOPLASM OF BREAST (ICD-V10.3) Mammogram scheduled.  Complete Medication List: 1)  Aspirin Adult Low Strength 81 Mg Tbec (Aspirin) .... Take  one tablet by mouth daily 2)  Lisinopril-hydrochlorothiazide 10-12.5 Mg Tabs (Lisinopril-hydrochlorothiazide) .... Take one tablet by mouth daily 3)  Atenolol 50 Mg Tabs (Atenolol) .... Take one tablet by mouth daily 4)  Flonase 50 Mcg/act Susp (Fluticasone propionate) .... One squirt in each nostril prn 5)  Famotidine 20 Mg Tabs (Famotidine) .... Take 1 tablet by mouth every 12 hours as needed for indigestion/acid reflux  Other Orders: Mammogram (Mammogram) Flu Vaccine 65yrs + (09811) Admin 1st Vaccine (91478)   Patient Instructions: 1)  Please schedule a follow-up appointment in 2 months for CPP with Dr.Jamye Balicki 2)  Got seasonal FLU shot Today   Prescriptions: ATENOLOL 50 MG TABS (ATENOLOL) take one tablet by mouth daily  #30 x 5   Entered and Authorized by:   Beverley Fiedler MD   Signed by:   Beverley Fiedler MD on 02/12/2008   Method used:   Electronically to        [facility] [phone]* (retail)       [address]       [place], [place]  [address]       Ph: [phone]       Fax: [phone]   RxID:   [id number] LISINOPRIL-HYDROCHLOROTHIAZIDE 10-12.5 MG TABS (LISINOPRIL-HYDROCHLOROTHIAZIDE) take one tablet by mouth daily  #30 x 5   Entered and Authorized by:   Beverley Fiedler MD   Signed by:   Beverley Fiedler MD on 02/12/2008   Method used:   Electronically to        [facility] [phone]* (retail)       [address]       [place], [place]  [address]       Ph: [phone]       Fax: [phone]   RxID:   [id number] FAMOTIDINE 20 MG TABS (FAMOTIDINE) Take 1 tablet by mouth every 12 hours as needed for indigestion/acid reflux  #60 x 4   Entered and Authorized by:   Beverley Fiedler MD   Signed by:   Beverley Fiedler MD on 02/12/2008   Method used:   Electronically to        [facility] [phone]* (retail)       [address]       [place], [place]  [address]       Ph: [phone]       Fax: [phone]   RxID:    [id number]    Vital Signs:  Patient Profile:   60 Years Old Female Height:     59.75 inches Weight:      146 pounds BMI:     28.86 BSA:     1.63 Temp:     98.2 degrees F  oral Pulse rate:   88 / minute Pulse rhythm:   regular Resp:     20 per minute BP sitting:   140 / 88 Cuff size:   regular                 Influenza Vaccine    Vaccine Type: Fluvax 3+    Site: left deltoid    Mfr: Sanofi Pasteur    Dose: 0.5 ml    Route: IM    Given by: Gaylyn Cheers RN    Exp. Date: 07/30/2008    Lot #: [id number]    VIS given: 08/24/06 version given February 12, 2008.  Flu Vaccine Consent Questions    Do you have a history of severe allergic reactions to this vaccine? no    Any prior history of allergic reactions to egg and/or gelatin? no    Do you have a sensitivity to the preservative Thimersol? no    Do you have a past history of Guillan-Barre Syndrome? no    Do you currently have an acute febrile illness? no    Have you ever had a severe reaction to latex? no    Vaccine information given and explained to patient? yes    Are you currently pregnant? no

## 2010-04-06 ENCOUNTER — Encounter (INDEPENDENT_AMBULATORY_CARE_PROVIDER_SITE_OTHER): Payer: Self-pay | Admitting: Nurse Practitioner

## 2010-04-07 ENCOUNTER — Encounter (INDEPENDENT_AMBULATORY_CARE_PROVIDER_SITE_OTHER): Payer: Self-pay | Admitting: Nurse Practitioner

## 2010-04-07 ENCOUNTER — Other Ambulatory Visit (HOSPITAL_COMMUNITY)
Admission: RE | Admit: 2010-04-07 | Discharge: 2010-04-07 | Disposition: A | Discharge status: Home / Self Care | Payer: Self-pay | Source: Ambulatory Visit | Attending: Internal Medicine | Admitting: Internal Medicine

## 2010-04-07 ENCOUNTER — Encounter: Payer: Self-pay | Admitting: Nurse Practitioner

## 2010-04-07 ENCOUNTER — Other Ambulatory Visit: Payer: Self-pay | Admitting: Nurse Practitioner

## 2010-04-07 DIAGNOSIS — Z01419 Encounter for gynecological examination (general) (routine) without abnormal findings: Secondary | ICD-10-CM | POA: Insufficient documentation

## 2010-04-07 LAB — CONVERTED CEMR LAB
Basophils Absolute: 0 10*3/uL (ref 0.0–0.1)
Basophils Relative: 1 % (ref 0–1)
Bilirubin Urine: NEGATIVE
Blood in Urine, dipstick: NEGATIVE
Chlamydia, Swab/Urine, PCR: NEGATIVE
Eosinophils Relative: 1 % (ref 0–5)
GC Probe Amp, Urine: NEGATIVE
Glucose, Urine, Semiquant: NEGATIVE
HCT: 38.2 % (ref 36.0–46.0)
KOH Prep: NEGATIVE
Ketones, urine, test strip: NEGATIVE
Lymphocytes Relative: 57 % — ABNORMAL HIGH (ref 12–46)
MCHC: 32.5 g/dL (ref 30.0–36.0)
Monocytes Absolute: 0.4 10*3/uL (ref 0.1–1.0)
Protein, U semiquant: NEGATIVE
RBC: 4.44 M/uL (ref 3.87–5.11)
RDW: 15.1 % (ref 11.5–15.5)
Specific Gravity, Urine: 1.015
WBC: 5.3 10*3/uL (ref 4.0–10.5)

## 2010-04-08 ENCOUNTER — Other Ambulatory Visit (HOSPITAL_COMMUNITY): Payer: Self-pay | Admitting: Internal Medicine

## 2010-04-08 ENCOUNTER — Encounter (INDEPENDENT_AMBULATORY_CARE_PROVIDER_SITE_OTHER): Payer: Self-pay | Admitting: Nurse Practitioner

## 2010-04-08 DIAGNOSIS — Z1231 Encounter for screening mammogram for malignant neoplasm of breast: Secondary | ICD-10-CM

## 2010-04-12 ENCOUNTER — Encounter (INDEPENDENT_AMBULATORY_CARE_PROVIDER_SITE_OTHER): Payer: Self-pay | Admitting: Nurse Practitioner

## 2010-04-13 ENCOUNTER — Ambulatory Visit (HOSPITAL_COMMUNITY): Payer: Self-pay

## 2010-04-13 NOTE — Letter (Signed)
Summary: *HSN Results Follow up  [facility]  [address] [place], [place] [address]   Phone: [phone]  Fax: [phone]      04/08/2010   [facility] [address] [place], [place]  [address]   Dear  Ms. Rose [name],                            ____S.Drinkard,FNP   ____D. Gore,FNP       ____B. McPherson,MD   ____V. Rankins,MD    ____E. Mulberry,MD    _X___N. Daphine Deutscher, FNP  ____D. Reche Dixon, MD    ____K. Philipp Deputy, MD    ____Other     This letter is to inform you that your recent test(s):  ___X____Pap Smear    ___X____Lab Test     _______X-ray    ___X____ is within acceptable limits  _______ requires a medication change  _______ requires a follow-up lab visit  _______ requires a follow-up visit with your provider   Comments: Labs done during recent office visit are normal.  Pap Smear results _________________________.       _________________________________________________________ If you have any questions, please contact our office [phone]                    Sincerely,    Rose Prom FNP [facility]

## 2010-04-13 NOTE — Assessment & Plan Note (Signed)
Summary: Complete Physical Exam   Vital Signs:  Patient profile:   60 year old female Menstrual status:  postmenopausal Weight:      149.0 pounds BMI:     29.20 O2 Sat:      100 % on Room air Temp:     98.0 degrees F oral Pulse rate:   76 / minute Pulse rhythm:   regular Resp:     20 per minute BP sitting:   130 / 82  (left arm) Cuff size:   regular  Vitals Entered By: Levon Hedger (April 07, 2010 10:34 AM)  Nutrition Counseling: Patient's BMI is greater than 25 and therefore counseled on weight management options.  O2 Flow:  Room air CC: CPP, Hypertension Management, Lipid Management, Abdominal Pain Is Patient Diabetic? No Pain Assessment Patient in pain? yes     Location: head Intensity: 9  Does patient need assistance? Functional Status Self care Ambulation Normal  Vision Screening:Left eye w/o correction: 20 / 20-1 Right Eye w/o correction: 20 / 25 Both eyes w/o correction:  20/ 20        Vision Entered By: Levon Hedger (April 07, 2010 11:04 AM)   Primary Care Provider:  Tereso Newcomer PA-C  CC:  CPP, Hypertension Management, Lipid Management, and Abdominal Pain.  History of Present Illness:  Pt into the office for a complete physical exam  PAP - last done 2010. No family history of cervical cancer. No menses since 1992 pt is divorced and has 2 adult children and 1 grandchild  Mammogram - last mammogram was 1 year ago Pt is a breast cancer survivor - lumpectomy  Optho - last eye exam was 2 years ago.  wears reading glasses  Dental - seen at dental clinic  Dyspepsia History:      She has no alarm features of dyspepsia including no history of melena, hematochezia, dysphagia, persistent vomiting, or involuntary weight loss > 5%.  There is a prior history of GERD.  The patient does not have a prior history of documented ulcer disease.  The dominant symptom is not heartburn or acid reflux.  An H-2 blocker medication is not currently being taken.      Hypertension History:      She denies headache, chest pain, and palpitations.  She notes no problems with any antihypertensive medication side effects.        Positive major cardiovascular risk factors include female age 59 years old or older, hyperlipidemia, hypertension, and current tobacco user.  Negative major cardiovascular risk factors include no history of diabetes and negative family history for ischemic heart disease.        Further assessment for target organ damage reveals no history of ASHD, stroke/TIA, or peripheral vascular disease.    Lipid Management History:      Positive NCEP/ATP III risk factors include female age 53 years old or older, current tobacco user, and hypertension.  Negative NCEP/ATP III risk factors include non-diabetic, no family history for ischemic heart disease, no ASHD (atherosclerotic heart disease), no prior stroke/TIA, no peripheral vascular disease, and no history of aortic aneurysm.        The patient states that she knows about the "Therapeutic Lifestyle Change" diet.  The patient does not know about adjunctive measures for cholesterol lowering.  She expresses no side effects from her lipid-lowering medication.  The patient denies any symptoms to suggest myopathy or liver disease.      Habits & Providers  Alcohol-Tobacco-Diet  Alcohol drinks/day: 0     Tobacco Status: current     Tobacco Counseling: to quit use of tobacco products     Cigarette Packs/Day: 0.5     Year Started: 80's  Exercise-Depression-Behavior     Does Patient Exercise: no     Have you felt down or hopeless? no     Have you felt little pleasure in things? no     Depression Counseling: not indicated; screening negative for depression     Drug Use: no     Seat Belt Use: 100     Sun Exposure: occasionally  Comments: Pt would like to quit smoking - she has taken chantix in the past but it gave her vivid dreams.  pt was stated on wellbutrin but she was not able to afford.   She has also gone to smoking cessation classes  Allergies (verified): No Known Drug Allergies  Review of Systems General:  Denies fever. Eyes:  Denies blurring. ENT:  Denies earache. CV:  Denies fatigue. Resp:  Denies cough. GI:  Denies abdominal pain. GU:  Denies discharge. MS:  Denies joint pain. Derm:  Denies dryness. Neuro:  Denies headaches. Psych:  Denies depression. Endo:  Denies excessive urination.  Physical Exam  General:  alert.   Head:  normocephalic.   Eyes:  pupils round.   Ears:  R ear normal and L ear normal.  bil TM with bony landmarks present Nose:  no nasal discharge.   Mouth:  fair dentition.   Neck:  supple.   Chest Wall:  no mass.   Breasts:  right upper outer breast s/p lumpectomy and well healed scar present left breast - nontender, no masses Lungs:  decreased bases Heart:  normal rate and regular rhythm.   Abdomen:  soft, non-tender, and normal bowel sounds.   Rectal:  no external abnormalities.   Msk:  normal ROM.   Pulses:  R radial normal and L radial normal.   Extremities:  no edema Neurologic:  alert & oriented X3.   Skin:  color normal.   Psych:  Oriented X3.    Pelvic Exam  Vulva:      normal appearance.   Urethra and Bladder:      Urethra--normal.  Bladder--normal.   Vagina:      physiologic discharge.   Cervix:      absent Adnexa:      nontender bilaterally.   Rectum:      normal, heme negative stool.      Impression & Recommendations:  Problem # 1:  EXAMINATION, ROUTINE MEDICAL (ICD-V70.0) rec optho and dental exam PAP done - vaginal  Orders: Vision Screening (07371) KOH/ WET Mount [phone]) Pap Smear, Thin Prep ( Collection of) (S8546) Rapid HIV  (92370) T- GC Chlamydia (27035)  Problem # 2:  TOBACCO ABUSE (ICD-305.1) advised cessation - pt wants to quit will order buproprion  Problem # 3:  HYPERTENSION (ICD-401.9)  Her updated medication list for this problem includes:    Lisinopril-hydrochlorothiazide  10-12.5 Mg Tabs (Lisinopril-hydrochlorothiazide) .Marland Kitchen... Take one tablet by mouth daily    Atenolol 50 Mg Tabs (Atenolol) .Marland Kitchen... Take one tablet by mouth daily  Orders: UA Dipstick w/o Micro (manual) (00938) EKG w/ Interpretation (93000) T-Urine Microalbumin w/creat. ratio [id number]) T-TSH (38101-75102)  Problem # 4:  HYPERLIPIDEMIA (ICD-272.4)  Her updated medication list for this problem includes:    Pravastatin Sodium 20 Mg Tabs (Pravastatin sodium) .Marland Kitchen... 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 Take one tablet by mouth daily 2)  Lisinopril-hydrochlorothiazide 10-12.5 Mg Tabs (Lisinopril-hydrochlorothiazide) .... Take one tablet by mouth daily 3)  Atenolol 50 Mg Tabs (Atenolol) .... Take one tablet by mouth daily 4)  Rhinocort Aqua 32 Mcg/act Susp (Budesonide) .... One spray in each nostril two times a day ((hold head down) 5)  Pravastatin Sodium 20 Mg Tabs (Pravastatin sodium) .Marland Kitchen.. 1 tab by mouth daily 6)  Bupropion Hcl 150 Mg Xr12h-tab (Bupropion hcl) .... One tablet by mouth two times a day 7)  Loratadine 10 Mg Tabs (Loratadine) .... One tablet by mouth daily for allergies  Other Orders: Mammogram (Screening) (Mammo) T-CBC w/Diff (16109-60454)  Hypertension Assessment/Plan:      The patient's hypertensive risk group is category B: At least one risk factor (excluding diabetes) with no target organ damage.  Her calculated 10 year risk of coronary heart disease is 13 %.  Today's blood pressure is 130/82.  Her blood pressure goal is < 140/90.  Lipid Assessment/Plan:      Based on NCEP/ATP III, the patient's risk factor category is "2 or more risk factors and a calculated 10 year CAD risk of < 20%".  The patient's lipid goals are as follows: Total cholesterol goal is 200; LDL  cholesterol goal is 130; HDL cholesterol goal is 40; Triglyceride goal is 150.     Patient Instructions: 1)  Smoking cessation - bupropion has been sent to [facility] pharmacy.  Take it twice per day. 2)  Allergies - take medication for sinuses 3)  Follow up in 6 months for high blood pressure or sooner if necessary Prescriptions: LORATADINE 10 MG TABS (LORATADINE) One tablet by mouth daily for allergies  #30 x 5   Entered and Authorized by:   Lehman Prom FNP   Signed by:   Lehman Prom FNP on 04/07/2010   Method used:   Print then Give to Patient   RxID:   [id number] BUPROPION HCL 150 MG XR12H-TAB (BUPROPION HCL) One tablet by mouth two times a day  #60 x 3   Entered and Authorized by:   Lehman Prom FNP   Signed by:   Lehman Prom FNP on 04/07/2010   Method used:   Faxed to ...       [facility] - Pharmac (retail)       [address] [place], [place]  [address]       Ph: [phone]       Fax: [phone]   RxID:   [id number] RHINOCORT AQUA 32 MCG/ACT SUSP (BUDESONIDE) One spray in each nostril two times a day ((hold head down)  #1 x 11   Entered and Authorized by:   Lehman Prom FNP   Signed by:   Lehman Prom FNP on 04/07/2010   Method used:   Faxed to ...       [facility] - Pharmac (retail)       [address] [place], [place]  [address]       Ph: [phone]       Fax: [phone]   RxID:   [id number] PRAVASTATIN SODIUM 20 MG TABS (PRAVASTATIN SODIUM) 1 tab by mouth daily  #30 Each x 11   Entered and Authorized by:  Lehman Prom FNP   Signed by:   Lehman Prom FNP on 04/07/2010   Method used:   Print then Give to Patient   RxID:   [id number] LISINOPRIL-HYDROCHLOROTHIAZIDE 10-12.5 MG TABS (LISINOPRIL-HYDROCHLOROTHIAZIDE) take one tablet by mouth daily  #30 Each x 11   Entered and Authorized by:   Lehman Prom FNP   Signed by:   Lehman Prom  FNP on 04/07/2010   Method used:   Print then Give to Patient   RxID:   [id number] ATENOLOL 50 MG TABS (ATENOLOL) take one tablet by mouth daily  #30 Each x 11   Entered and Authorized by:   Lehman Prom FNP   Signed by:   Lehman Prom FNP on 04/07/2010   Method used:   Print then Give to Patient   RxID:   [id number]    Orders Added: 1)  Mammogram (Screening) [Mammo] 2)  Est. Patient age 68-64 [6] 3)  Vision Screening [99173] 4)  UA Dipstick w/o Micro (manual) [81002] 5)  EKG w/ Interpretation [93000] 6)  T-Urine Microalbumin w/creat. ratio [82043-[address]] 7)  KOH/ WET Mount [87210] 8)  Pap Smear, Thin Prep ( Collection of) [Q0091] 9)  T-CBC w/Diff [56387-56433] 10)  T-TSH [29518-84166] 11)  Rapid HIV  [92370] 12)  T- GC Chlamydia [06301]    Laboratory Results   Urine Tests  Date/Time Received: April 07, 2010 12:00 PM   Routine Urinalysis   Color: lt. yellow Glucose: negative   (Normal Range: Negative) Bilirubin: negative   (Normal Range: Negative) Ketone: negative   (Normal Range: Negative) Spec. Gravity: 1.015   (Normal Range: 1.003-1.035) Blood: negative   (Normal Range: Negative) pH: 7.0   (Normal Range: 5.0-8.0) Protein: negative   (Normal Range: Negative) Urobilinogen: 0.2   (Normal Range: 0-1) Nitrite: negative   (Normal Range: Negative) Leukocyte Esterace: trace   (Normal Range: Negative)    Date/Time Received: April 07, 2010  1:31 PM   [employer] Source: vaginal WBC/hpf: 1-5 Bacteria/hpf: rare Clue cells/hpf: none Yeast/hpf: none Wet Mount KOH: Negative Trichomonas/hpf: none     EKG  Procedure date:  04/07/2010  Findings:      NSR   Appended Document: Complete Physical Exam     Allergies: No Known Drug Allergies   Complete Medication List: 1)  Aspirin Adult Low Strength 81 Mg Tbec (Aspirin) .... Take one tablet by mouth daily 2)  Lisinopril-hydrochlorothiazide 10-12.5 Mg Tabs  (Lisinopril-hydrochlorothiazide) .... Take one tablet by mouth daily 3)  Atenolol 50 Mg Tabs (Atenolol) .... Take one tablet by mouth daily 4)  Rhinocort Aqua 32 Mcg/act Susp (Budesonide) .... One spray in each nostril two times a day ((hold head down) 5)  Pravastatin Sodium 20 Mg Tabs (Pravastatin sodium) .Marland Kitchen.. 1 tab by mouth daily 6)  Bupropion Hcl 150 Mg Xr12h-tab (Bupropion hcl) .... One tablet by mouth two times a day 7)  Loratadine 10 Mg Tabs (Loratadine) .... One tablet by mouth daily for allergies      Laboratory Results  Date/Time Received: April 08, 2010 8:37 AM   Other Tests  Rapid HIV: negative

## 2010-04-13 NOTE — Letter (Signed)
Summary: TEST ORDER FORM//MAMMOGRAM //APPT DATE & TIME  TEST ORDER FORM//MAMMOGRAM //APPT DATE & TIME   Imported By: Arta Bruce 04/09/2010 11:27:05  _____________________________________________________________________  External Attachment:    Type:   Image     Comment:   External Document

## 2010-10-21 LAB — I-STAT 8, (EC8 V) (CONVERTED LAB)
BUN: 10
Bicarbonate: 25.9 — ABNORMAL HIGH
HCT: 46
Operator id: 234501
pCO2, Ven: 48.1
pH, Ven: 7.339 — ABNORMAL HIGH

## 2010-10-21 LAB — POCT CARDIAC MARKERS
CKMB, poc: 1.5
Myoglobin, poc: 63.6
Troponin i, poc: <0.05

## 2010-10-21 LAB — D-DIMER, QUANTITATIVE: D-Dimer, Quant: 0.55 — ABNORMAL HIGH

## 2011-05-24 ENCOUNTER — Other Ambulatory Visit: Payer: Self-pay | Admitting: Internal Medicine

## 2011-05-24 DIAGNOSIS — Z1231 Encounter for screening mammogram for malignant neoplasm of breast: Secondary | ICD-10-CM

## 2011-05-27 ENCOUNTER — Ambulatory Visit
Admission: RE | Admit: 2011-05-27 | Discharge: 2011-05-27 | Disposition: A | Discharge status: Home / Self Care | Payer: Self-pay | Source: Ambulatory Visit | Attending: Internal Medicine | Admitting: Internal Medicine

## 2011-05-27 DIAGNOSIS — Z1231 Encounter for screening mammogram for malignant neoplasm of breast: Secondary | ICD-10-CM

## 2011-11-21 ENCOUNTER — Encounter (HOSPITAL_COMMUNITY): Payer: Self-pay

## 2011-11-21 ENCOUNTER — Emergency Department (HOSPITAL_COMMUNITY)
Admission: EM | Admit: 2011-11-21 | Discharge: 2011-11-21 | Disposition: A | Discharge status: Home / Self Care | Payer: Medicaid Other | Source: Home / Self Care | Attending: Emergency Medicine | Admitting: Emergency Medicine

## 2011-11-21 DIAGNOSIS — R3989 Other symptoms and signs involving the genitourinary system: Secondary | ICD-10-CM

## 2011-11-21 DIAGNOSIS — R399 Unspecified symptoms and signs involving the genitourinary system: Secondary | ICD-10-CM

## 2011-11-21 DIAGNOSIS — S39012A Strain of muscle, fascia and tendon of lower back, initial encounter: Secondary | ICD-10-CM

## 2011-11-21 DIAGNOSIS — S335XXA Sprain of ligaments of lumbar spine, initial encounter: Secondary | ICD-10-CM

## 2011-11-21 HISTORY — DX: Essential (primary) hypertension: I10

## 2011-11-21 LAB — POCT URINALYSIS DIP (DEVICE)
Bilirubin Urine: NEGATIVE
Hgb urine dipstick: NEGATIVE
Ketones, ur: NEGATIVE mg/dL
Nitrite: NEGATIVE
Specific Gravity, Urine: 1.01 (ref 1.005–1.030)
pH: 5.5 (ref 5.0–8.0)

## 2011-11-21 MED ORDER — NITROFURANTOIN MONOHYD MACRO 100 MG PO CAPS: 100.0000 mg | ORAL_CAPSULE | Freq: Two times a day (BID) | ORAL | Status: AC

## 2011-11-21 NOTE — ED Notes (Signed)
Patient complains of frequent and painful urination, low back pain x 1 week

## 2011-11-21 NOTE — ED Provider Notes (Signed)
History     CSN: [id number]  Arrival date & time 11/21/11  1036   First MD Initiated Contact with Patient 11/21/11 1036      Chief Complaint  Patient presents with  . Dysuria    (Consider location/radiation/quality/duration/timing/severity/associated sxs/prior treatment) HPI Comments: Patient presents urgent care describing that for about 5-6 days she's been noticing that she is urinating frequently and with some degree of pressure. Denies any pain or burning. She also describes a for about a week she's been having lower back pain that exacerbates with movement and activity. She denies any fevers, nausea vomiting flank pain or abdominal pain. She is not taking anything over-the-counter denies any vaginal discharge or bleeding. Denies any unintentional weight loss, weakness, changes in appetite, or lower extremity paresthesias or weakness  Patient is a 61 y.o. female presenting with dysuria. The history is provided by the patient.  Dysuria  This is a new problem. The current episode started more than 2 days ago. The problem occurs intermittently. The problem has been gradually worsening. The pain is at a severity of 3/10. The pain is mild. There has been no fever. Associated symptoms include frequency. Pertinent negatives include no chills, no nausea, no vomiting, no hematuria and no flank pain. Her past medical history does not include kidney stones, urological procedure, recurrent UTIs or catheterization.    Past Medical History  Diagnosis Date  . Hypertension     Past Surgical History  Procedure Date  . Appendectomy     History reviewed. No pertinent family history.  History  Substance Use Topics  . Smoking status: Current Every Day Smoker -- 0.5 packs/day  . Smokeless tobacco: Not on file  . Alcohol Use: No    OB History    Grav Para Term Preterm Abortions TAB SAB Ect Mult Living                  Review of Systems  Constitutional: Positive for activity change.  Negative for fever, chills, appetite change and fatigue.  Gastrointestinal: Negative for nausea, vomiting, abdominal pain and rectal pain.  Genitourinary: Positive for dysuria and frequency. Negative for hematuria, flank pain, vaginal bleeding, vaginal discharge and vaginal pain.  Musculoskeletal: Positive for back pain. Negative for myalgias and arthralgias.    Allergies  Review of patient's allergies indicates no known allergies.  Home Medications   Current Outpatient Rx  Name Route Sig Dispense Refill  . ATENOLOL 50 MG PO TABS Oral Take 50 mg by mouth daily.    Marland Kitchen LISINOPRIL-HYDROCHLOROTHIAZIDE 10-12.5 MG PO TABS Oral Take 1 tablet by mouth daily.    Marland Kitchen PRAVASTATIN SODIUM 20 MG PO TABS Oral Take 20 mg by mouth daily.    Marland Kitchen NITROFURANTOIN MONOHYD MACRO 100 MG PO CAPS Oral Take 1 capsule (100 mg total) by mouth 2 (two) times daily. 6 capsule 0    BP 124/76  Pulse 76  Temp 97.4 F (36.3 C) (Oral)  Resp 12  SpO2 96%  Physical Exam  Nursing note and vitals reviewed. Constitutional: She appears well-developed and well-nourished. No distress.  Pulmonary/Chest: Effort normal and breath sounds normal.  Abdominal: She exhibits no distension and no mass. There is no tenderness. There is no rebound and no guarding.  Musculoskeletal: She exhibits tenderness.       Lumbar back: She exhibits tenderness. She exhibits normal range of motion, no bony tenderness and no swelling.       Back:  Neurological: She is alert.  Skin: No rash noted.  No erythema.    ED Course  Procedures (including critical care time)  Labs Reviewed  POCT URINALYSIS DIP (DEVICE) - Abnormal; Notable for the following:    Leukocytes, UA TRACE (*)  Biochemical Testing Only. Please order routine urinalysis from main lab if confirmatory testing is needed.   All other components within normal limits  URINE CULTURE   No results found.   1. Lower urinary tract symptoms   2. Lumbar spine strain       MDM  Patient  with urinary symptoms for approximately 6 days. 8 trace leukocytes noted and a urine dip have prescribed Macrobid for 3 days until culture results available. Patient denies any true pain with urination describes predominantly increased frequency. Her lower back pain seems to be more muscular in character in origin as reproducible on exam. Have advised patient to take over-the-counter ibuprofen for it she agrees to both treatment plans and followup care and conservative management of her urinary symptoms with Macrobid until culture results available as she was explained she will be contacted if results will require a further management or treatment        Rose Molly, MD 11/21/11 1206

## 2011-11-22 LAB — URINE CULTURE
Colony Count: NO GROWTH
Culture: NO GROWTH

## 2012-08-21 ENCOUNTER — Other Ambulatory Visit: Payer: Self-pay

## 2012-08-21 DIAGNOSIS — Z1231 Encounter for screening mammogram for malignant neoplasm of breast: Secondary | ICD-10-CM

## 2012-09-12 ENCOUNTER — Ambulatory Visit: Payer: Medicaid Other

## 2012-09-24 ENCOUNTER — Emergency Department (INDEPENDENT_AMBULATORY_CARE_PROVIDER_SITE_OTHER): Payer: Medicaid Other

## 2012-09-24 ENCOUNTER — Emergency Department (HOSPITAL_COMMUNITY)
Admission: EM | Admit: 2012-09-24 | Discharge: 2012-09-24 | Disposition: A | Discharge status: Home / Self Care | Payer: Medicaid Other | Source: Home / Self Care | Attending: Family Medicine | Admitting: Family Medicine

## 2012-09-24 ENCOUNTER — Encounter (HOSPITAL_COMMUNITY): Payer: Self-pay

## 2012-09-24 DIAGNOSIS — J40 Bronchitis, not specified as acute or chronic: Secondary | ICD-10-CM

## 2012-09-24 MED ORDER — BENZONATATE 100 MG PO CAPS: 100.0000 mg | ORAL_CAPSULE | Freq: Three times a day (TID) | ORAL | Status: DC

## 2012-09-24 MED ORDER — ALBUTEROL SULFATE HFA 108 (90 BASE) MCG/ACT IN AERS: 1.0000 | INHALATION_SPRAY | Freq: Four times a day (QID) | RESPIRATORY_TRACT | Status: DC | PRN

## 2012-09-24 MED ORDER — AZITHROMYCIN 250 MG PO TABS: 250.0000 mg | ORAL_TABLET | Freq: Every day | ORAL | Status: DC

## 2012-09-24 MED ORDER — PREDNISONE 20 MG PO TABS: ORAL_TABLET | ORAL | Status: DC

## 2012-09-24 NOTE — ED Notes (Signed)
rx redirected to [facility]

## 2012-09-24 NOTE — ED Provider Notes (Signed)
CSN: [id number]     Arrival date & time 09/24/12  0805 History     First MD Initiated Contact with Patient 09/24/12 [phone]     Chief Complaint  Patient presents with  . Cough   (Consider location/radiation/quality/duration/timing/severity/associated sxs/prior Treatment) HPI Comments: 62 year old smoker female with history of hypertension. Here complaining of nasal congestion, productive cough of a clear sputum and episodes of wheezing for one week. Feels her chest is tight and coughing spells are making her feel tired. Reports subjective fever and decreased appetite. Also reports sinus discomfort. Not taking any medications for her symptoms other than fluticasone nasal. Denies nausea, vomiting or diarrhea. No headache. No current chest pain or shortness of breath. No abdominal pain. Has used inhalers in the past.   Past Medical History  Diagnosis Date  . Hypertension    Past Surgical History  Procedure Laterality Date  . Appendectomy    . Cholecystectomy     History reviewed. No pertinent family history. History  Substance Use Topics  . Smoking status: Current Every Day Smoker -- 0.50 packs/day  . Smokeless tobacco: Not on file  . Alcohol Use: No   OB History   Grav Para Term Preterm Abortions TAB SAB Ect Mult Living                 Review of Systems  Constitutional: Positive for fever, chills, appetite change and fatigue. Negative for diaphoresis.  HENT: Positive for congestion and rhinorrhea. Negative for sore throat and trouble swallowing.   Respiratory: Positive for cough. Negative for shortness of breath and wheezing.   Cardiovascular: Negative for chest pain, palpitations and leg swelling.  Gastrointestinal: Negative for nausea, vomiting, abdominal pain and diarrhea.  Skin: Negative for rash.  Neurological: Negative for dizziness and headaches.    Allergies  Review of patient's allergies indicates no known allergies.  Home Medications   Current Outpatient Rx   Name  Route  Sig  Dispense  Refill  . fluticasone (VERAMYST) 27.5 MCG/SPRAY nasal spray   Nasal   Place 2 sprays into the nose daily.         Marland Kitchen albuterol (PROVENTIL HFA;VENTOLIN HFA) 108 (90 BASE) MCG/ACT inhaler   Inhalation   Inhale 1-2 puffs into the lungs every 6 (six) hours as needed for wheezing.   1 Inhaler   0   . atenolol (TENORMIN) 50 MG tablet   Oral   Take 50 mg by mouth daily.         Marland Kitchen azithromycin (ZITHROMAX) 250 MG tablet   Oral   Take 1 tablet (250 mg total) by mouth daily. Take first 2 tablets together, then 1 every day until finished.   6 tablet   0   . benzonatate (TESSALON) 100 MG capsule   Oral   Take 1 capsule (100 mg total) by mouth every 8 (eight) hours.   21 capsule   0   . lisinopril-hydrochlorothiazide (PRINZIDE,ZESTORETIC) 10-12.5 MG per tablet   Oral   Take 1 tablet by mouth daily.         . pravastatin (PRAVACHOL) 20 MG tablet   Oral   Take 20 mg by mouth daily.         . predniSONE (DELTASONE) 20 MG tablet      2 tabs by mouth daily for 5 days   10 tablet   0    BP 124/62  Pulse 77  Temp(Src) 97.7 F (36.5 C) (Oral)  Resp 16  SpO2 100% Physical Exam  Nursing note and vitals reviewed. Constitutional: She is oriented to person, place, and time. She appears well-developed and well-nourished. No distress.  HENT:  Head: Normocephalic and atraumatic.  Mouth/Throat: No oropharyngeal exudate.  Nasal Congestion with erythema and swelling of nasal turbinates, clear rhinorrhea. no pharyngeal erythema no exudates. No uvula deviation. No trismus. TM's normal.  Eyes: Conjunctivae are normal. Pupils are equal, round, and reactive to light. No scleral icterus.  Neck: Neck supple. No JVD present.  Cardiovascular: Normal rate, regular rhythm and normal heart sounds.  Exam reveals no gallop and no friction rub.   No murmur heard. Pulmonary/Chest: No respiratory distress. She has no wheezes. She has no rales. She exhibits no  tenderness.  Bronchitic cough sporadic bilateral rhonchi. No active wheezing. No orthopnea no tachypnea.  Abdominal: Soft. There is no tenderness.  Lymphadenopathy:    She has no cervical adenopathy.  Neurological: She is alert and oriented to person, place, and time.  Skin: No rash noted. She is not diaphoretic.    ED Course   Procedures (including critical care time)  Labs Reviewed - No data to display Dg Chest 2 View  09/24/2012   CLINICAL DATA:  Cough and shortness of breath. Smoker. Personal history of breast carcinoma.  EXAM: CHEST  2 VIEW  COMPARISON:  None.  FINDINGS: The heart size and mediastinal contours are within normal limits. Both lungs are clear. No mass or lymphadenopathy identified. Right axillary surgical clips again seen. Mild thoracic spine degenerative changes again noted.  IMPRESSION: Stable exam. No active cardiopulmonary disease.   Electronically Signed   By: Myles Rosenthal   On: 09/24/2012 09:40   1. Bronchitis     MDM  Treated with prednisone, azithromycin, albuterol and Tessalon Perles. Recommended smoking cessation. Supportive care and red flags that should prompt her return to medical attention discussed with patient and provided in writing.  Sharin Grave, MD 09/27/12 [phone]

## 2012-09-24 NOTE — ED Notes (Signed)
C/o 1 week duration of cough, congestion, hard to breathe , fever, chills, stuffy nose; clear and white secretions; NAD

## 2012-09-25 ENCOUNTER — Ambulatory Visit
Admission: RE | Admit: 2012-09-25 | Discharge: 2012-09-25 | Disposition: A | Discharge status: Home / Self Care | Payer: Medicaid Other | Source: Ambulatory Visit

## 2012-09-25 DIAGNOSIS — Z1231 Encounter for screening mammogram for malignant neoplasm of breast: Secondary | ICD-10-CM

## 2013-11-05 ENCOUNTER — Emergency Department (HOSPITAL_COMMUNITY)
Admission: EM | Admit: 2013-11-05 | Discharge: 2013-11-05 | Disposition: A | Discharge status: Home / Self Care | Payer: Self-pay | Source: Home / Self Care | Attending: Family Medicine | Admitting: Family Medicine

## 2013-11-05 ENCOUNTER — Encounter (HOSPITAL_COMMUNITY): Payer: Self-pay | Admitting: Emergency Medicine

## 2013-11-05 DIAGNOSIS — J45901 Unspecified asthma with (acute) exacerbation: Secondary | ICD-10-CM

## 2013-11-05 MED ORDER — ALBUTEROL SULFATE (2.5 MG/3ML) 0.083% IN NEBU
INHALATION_SOLUTION | RESPIRATORY_TRACT | Status: AC
  Filled 2013-11-05: qty 3

## 2013-11-05 MED ORDER — ALBUTEROL SULFATE (2.5 MG/3ML) 0.083% IN NEBU
5.0000 mg | INHALATION_SOLUTION | Freq: Once | RESPIRATORY_TRACT | Status: AC
  Administered 2013-11-05: 5 mg via RESPIRATORY_TRACT

## 2013-11-05 MED ORDER — IPRATROPIUM BROMIDE 0.02 % IN SOLN
RESPIRATORY_TRACT | Status: AC
Start: 2013-11-05 — End: 2013-11-05
  Filled 2013-11-05: qty 2.5

## 2013-11-05 MED ORDER — ALBUTEROL SULFATE HFA 108 (90 BASE) MCG/ACT IN AERS: 2.0000 | INHALATION_SPRAY | Freq: Four times a day (QID) | RESPIRATORY_TRACT | Status: DC | PRN

## 2013-11-05 MED ORDER — METHYLPREDNISOLONE SODIUM SUCC 125 MG IJ SOLR
INTRAMUSCULAR | Status: AC
  Filled 2013-11-05: qty 2

## 2013-11-05 MED ORDER — METHYLPREDNISOLONE 4 MG PO KIT: PACK | ORAL | Status: DC

## 2013-11-05 MED ORDER — IPRATROPIUM BROMIDE 0.02 % IN SOLN
0.5000 mg | Freq: Once | RESPIRATORY_TRACT | Status: AC
  Administered 2013-11-05: 0.5 mg via RESPIRATORY_TRACT

## 2013-11-05 MED ORDER — LEVOFLOXACIN 500 MG PO TABS: 500.0000 mg | ORAL_TABLET | Freq: Every day | ORAL | Status: DC

## 2013-11-05 MED ORDER — METHYLPREDNISOLONE SODIUM SUCC 125 MG IJ SOLR
125.0000 mg | Freq: Once | INTRAMUSCULAR | Status: AC
  Administered 2013-11-05: 125 mg via INTRAMUSCULAR

## 2013-11-05 NOTE — Discharge Instructions (Signed)
Take all of medicine, drink lots of fluids, no more smoking, see your doctor if further problems  °

## 2013-11-05 NOTE — ED Notes (Signed)
Pt states that she has had chest congestion and shortness of breath with wheezing since 11/02/2013. Pt states pain is 3 out of 10 she states r/t  Her coughing. Pt is not in any acute distress at this time. Pt states tht she is taking OTC mucous relief.

## 2013-11-05 NOTE — ED Provider Notes (Signed)
CSN: [id number]     Arrival date & time 11/05/13  0806 History   First MD Initiated Contact with Patient 11/05/13 0831     Chief Complaint  Patient presents with  . Wheezing    pt staes chest congestion also   (Consider location/radiation/quality/duration/timing/severity/associated sxs/prior Treatment) Patient is a 63 y.o. female presenting with wheezing. The history is provided by the patient.  Wheezing Severity:  Mild Onset quality:  Gradual Chronicity:  Recurrent Context: smoke exposure   Relieved by:  None tried Worsened by:  Nothing tried Associated symptoms: cough and rhinorrhea   Associated symptoms: no stridor     Past Medical History  Diagnosis Date  . Hypertension    Past Surgical History  Procedure Laterality Date  . Appendectomy    . Cholecystectomy     History reviewed. No pertinent family history. History  Substance Use Topics  . Smoking status: Current Every Day Smoker -- 0.50 packs/day  . Smokeless tobacco: Not on file  . Alcohol Use: No   OB History   Grav Para Term Preterm Abortions TAB SAB Ect Mult Living                 Review of Systems  Constitutional: Negative.   HENT: Positive for rhinorrhea.   Respiratory: Positive for cough and wheezing. Negative for stridor.   Cardiovascular: Negative.     Allergies  Review of patient's allergies indicates no known allergies.  Home Medications   Prior to Admission medications   Medication Sig Start Date End Date Taking? Authorizing Provider  atenolol (TENORMIN) 50 MG tablet Take 50 mg by mouth daily.   Yes Historical Provider, MD  lisinopril-hydrochlorothiazide (PRINZIDE,ZESTORETIC) 10-12.5 MG per tablet Take 1 tablet by mouth daily.   Yes Historical Provider, MD  pravastatin (PRAVACHOL) 20 MG tablet Take 20 mg by mouth daily.   Yes Historical Provider, MD  albuterol (PROVENTIL HFA;VENTOLIN HFA) 108 (90 BASE) MCG/ACT inhaler Inhale 1-2 puffs into the lungs every 6 (six) hours as needed for  wheezing. 09/24/12   Adlih Moreno-Coll, MD  albuterol (PROVENTIL HFA;VENTOLIN HFA) 108 (90 BASE) MCG/ACT inhaler Inhale 2 puffs into the lungs every 6 (six) hours as needed for wheezing or shortness of breath. 11/05/13   Billy Fischer, MD  azithromycin (ZITHROMAX) 250 MG tablet Take 1 tablet (250 mg total) by mouth daily. Take first 2 tablets together, then 1 every day until finished. 09/24/12   Adlih Moreno-Coll, MD  benzonatate (TESSALON) 100 MG capsule Take 1 capsule (100 mg total) by mouth every 8 (eight) hours. 09/24/12   Adlih Moreno-Coll, MD  fluticasone (VERAMYST) 27.5 MCG/SPRAY nasal spray Place 2 sprays into the nose daily.    Historical Provider, MD  levofloxacin (LEVAQUIN) 500 MG tablet Take 1 tablet (500 mg total) by mouth daily. 11/05/13   Billy Fischer, MD  methylPREDNISolone (MEDROL DOSEPAK) 4 MG tablet follow package directions, start on wed, take until finished. 11/05/13   Billy Fischer, MD  predniSONE (DELTASONE) 20 MG tablet 2 tabs by mouth daily for 5 days 09/24/12   Adlih Moreno-Coll, MD   BP 133/64  Pulse 87  Temp(Src) 98.7 F (37.1 C) (Oral)  Resp 18  Ht 5\' 1"  (1.549 m)  Wt 141 lb (63.957 kg)  BMI 26.66 kg/m2  SpO2 95% Physical Exam  Nursing note and vitals reviewed. Constitutional: She is oriented to person, place, and time. She appears well-developed and well-nourished.  HENT:  Right Ear: External ear normal.  Left Ear: External ear  normal.  Mouth/Throat: Oropharynx is clear and moist.  Eyes: Conjunctivae are normal. Pupils are equal, round, and reactive to light.  Neck: Normal range of motion. Neck supple.  Cardiovascular: Normal heart sounds and intact distal pulses.   Pulmonary/Chest: Effort normal. She has wheezes. She has no rales.  Lymphadenopathy:    She has no cervical adenopathy.  Neurological: She is alert and oriented to person, place, and time.  Skin: Skin is warm and dry.    ED Course  Procedures (including critical care time) Labs Review Labs  Reviewed - No data to display  Imaging Review No results found.   MDM   1. Asthmatic bronchitis with acute exacerbation    Sx improved, lungs clear after neb at d/c.    Billy Fischer, MD 11/05/13 [phone]

## 2013-11-22 ENCOUNTER — Encounter (HOSPITAL_COMMUNITY): Payer: Self-pay | Admitting: Emergency Medicine

## 2013-11-22 ENCOUNTER — Emergency Department (HOSPITAL_COMMUNITY)
Admission: EM | Admit: 2013-11-22 | Discharge: 2013-11-22 | Disposition: A | Discharge status: Home / Self Care | Payer: No Typology Code available for payment source | Source: Home / Self Care | Attending: Family Medicine | Admitting: Family Medicine

## 2013-11-22 DIAGNOSIS — B029 Zoster without complications: Secondary | ICD-10-CM

## 2013-11-22 DIAGNOSIS — K2970 Gastritis, unspecified, without bleeding: Secondary | ICD-10-CM

## 2013-11-22 DIAGNOSIS — B9681 Helicobacter pylori [H. pylori] as the cause of diseases classified elsewhere: Secondary | ICD-10-CM

## 2013-11-22 DIAGNOSIS — R1010 Upper abdominal pain, unspecified: Secondary | ICD-10-CM

## 2013-11-22 LAB — COMPREHENSIVE METABOLIC PANEL
ALBUMIN: 4.4 g/dL (ref 3.5–5.2)
ALT: 13 U/L (ref 0–35)
ANION GAP: 11 (ref 5–15)
AST: 17 U/L (ref 0–37)
Alkaline Phosphatase: 77 U/L (ref 39–117)
BILIRUBIN TOTAL: 0.6 mg/dL (ref 0.3–1.2)
BUN: 9 mg/dL (ref 6–23)
CALCIUM: 10.3 mg/dL (ref 8.4–10.5)
CO2: 26 mEq/L (ref 19–32)
CREATININE: 0.82 mg/dL (ref 0.50–1.10)
Chloride: 103 mEq/L (ref 96–112)
GFR calc Af Amer: 87 mL/min — ABNORMAL LOW (ref >90)
GFR calc non Af Amer: 75 mL/min — ABNORMAL LOW (ref >90)
Glucose, Bld: 90 mg/dL (ref 70–99)
Potassium: 4.2 mEq/L (ref 3.7–5.3)
Sodium: 140 mEq/L (ref 137–147)
TOTAL PROTEIN: 8.6 g/dL — AB (ref 6.0–8.3)

## 2013-11-22 LAB — CBC WITH DIFFERENTIAL/PLATELET
BASOS ABS: 0 10*3/uL (ref 0.0–0.1)
BASOS PCT: 1 % (ref 0–1)
EOS ABS: 0 10*3/uL (ref 0.0–0.7)
EOS PCT: 1 % (ref 0–5)
HEMATOCRIT: 39.5 % (ref 36.0–46.0)
HEMOGLOBIN: 13.2 g/dL (ref 12.0–15.0)
Lymphocytes Relative: 48 % — ABNORMAL HIGH (ref 12–46)
Lymphs Abs: 2.3 10*3/uL (ref 0.7–4.0)
MCH: 28.6 pg (ref 26.0–34.0)
MCHC: 33.4 g/dL (ref 30.0–36.0)
MCV: 85.5 fL (ref 78.0–100.0)
MONO ABS: 0.4 10*3/uL (ref 0.1–1.0)
MONOS PCT: 8 % (ref 3–12)
NEUTROS ABS: 2 10*3/uL (ref 1.7–7.7)
Neutrophils Relative %: 42 % — ABNORMAL LOW (ref 43–77)
Platelets: 249 10*3/uL (ref 150–400)
RBC: 4.62 MIL/uL (ref 3.87–5.11)
RDW: 14.4 % (ref 11.5–15.5)
WBC: 4.7 10*3/uL (ref 4.0–10.5)

## 2013-11-22 LAB — LIPASE, BLOOD: LIPASE: 32 U/L (ref 11–59)

## 2013-11-22 LAB — POCT H PYLORI SCREEN: H. PYLORI SCREEN, POC: POSITIVE — AB

## 2013-11-22 MED ORDER — HYDROCODONE-ACETAMINOPHEN 7.5-325 MG/15ML PO SOLN: 10.0000 mL | Freq: Four times a day (QID) | ORAL | Status: DC | PRN

## 2013-11-22 MED ORDER — AMOXICILLIN 500 MG PO CAPS: 1000.0000 mg | ORAL_CAPSULE | Freq: Two times a day (BID) | ORAL | Status: DC

## 2013-11-22 MED ORDER — AMOXICILLIN 400 MG/5ML PO SUSR: 1000.0000 mg | Freq: Two times a day (BID) | ORAL | Status: AC

## 2013-11-22 MED ORDER — CLARITHROMYCIN 500 MG PO TABS: 500.0000 mg | ORAL_TABLET | Freq: Two times a day (BID) | ORAL | Status: DC

## 2013-11-22 MED ORDER — CLARITHROMYCIN 250 MG/5ML PO SUSR: 500.0000 mg | Freq: Two times a day (BID) | ORAL | Status: DC

## 2013-11-22 MED ORDER — METRONIDAZOLE 50 MG/ML ORAL SUSPENSION: 500.0000 mg | Freq: Two times a day (BID) | ORAL | Status: DC

## 2013-11-22 MED ORDER — METRONIDAZOLE 500 MG PO TABS: 500.0000 mg | ORAL_TABLET | Freq: Two times a day (BID) | ORAL | Status: DC

## 2013-11-22 MED ORDER — HYDROCODONE-ACETAMINOPHEN 5-325 MG PO TABS
1.0000 | ORAL_TABLET | Freq: Four times a day (QID) | ORAL | Status: DC | PRN
Start: 2013-11-22 — End: 2013-11-22

## 2013-11-22 NOTE — ED Notes (Signed)
63 year old female states she has had increasing pain to her left upper abd under her left ribs for the past 3 days.  Says it really  Hurts.   Been trying  Prilosec OTC, Mylanta, and  Other OTC antacids  With no relief.  Stat EKG done,  While doing EKG  I noted several little  Red raised patches on patients skin that are unilateral on her left side.

## 2013-11-22 NOTE — ED Provider Notes (Signed)
CSN: [id number]     Arrival date & time 11/22/13  1436 History   None    Chief Complaint  Patient presents with  . Abdominal Pain   (Consider location/radiation/quality/duration/timing/severity/associated sxs/prior Treatment) Patient is a 63 y.o. female presenting with abdominal pain.  Abdominal Pain Associated symptoms: no chills, no diarrhea, no fatigue, no fever, no nausea and no vomiting        63 year old female presents complaining of abdominal bloating and abdominal pain, belching, reflux. This has been going on now for a few days. She also feels fatigued. She has a history of occasional reflux for which she has taken Prilosec for the past. She is not taking Prilosec). She has been taking Maalox and antacids without relief. No fever, chills, NVD, weight loss, early satiety. No history of H. pylori.  Past Medical History  Diagnosis Date  . Hypertension    Past Surgical History  Procedure Laterality Date  . Appendectomy    . Cholecystectomy    . Abdominal hysterectomy     History reviewed. No pertinent family history. History  Substance Use Topics  . Smoking status: Current Every Day Smoker -- 0.50 packs/day  . Smokeless tobacco: Not on file  . Alcohol Use: No   OB History   Grav Para Term Preterm Abortions TAB SAB Ect Mult Living                 Review of Systems  Constitutional: Negative for fever, chills and fatigue.  Gastrointestinal: Positive for abdominal pain. Negative for nausea, vomiting and diarrhea.  All other systems reviewed and are negative.   Allergies  Review of patient's allergies indicates no known allergies.  Home Medications   Prior to Admission medications   Medication Sig Start Date End Date Taking? Authorizing Provider  albuterol (PROVENTIL HFA;VENTOLIN HFA) 108 (90 BASE) MCG/ACT inhaler Inhale 1-2 puffs into the lungs every 6 (six) hours as needed for wheezing. 09/24/12   Adlih Moreno-Coll, MD  albuterol (PROVENTIL HFA;VENTOLIN HFA) 108  (90 BASE) MCG/ACT inhaler Inhale 2 puffs into the lungs every 6 (six) hours as needed for wheezing or shortness of breath. 11/05/13   Billy Fischer, MD  amoxicillin (AMOXIL) 400 MG/5ML suspension Take 12.5 mLs (1,000 mg total) by mouth 2 (two) times daily. 11/22/13 11/29/13  Liam Graham, PA-C  atenolol (TENORMIN) 50 MG tablet Take 50 mg by mouth daily.    Historical Provider, MD  azithromycin (ZITHROMAX) 250 MG tablet Take 1 tablet (250 mg total) by mouth daily. Take first 2 tablets together, then 1 every day until finished. 09/24/12   Adlih Moreno-Coll, MD  benzonatate (TESSALON) 100 MG capsule Take 1 capsule (100 mg total) by mouth every 8 (eight) hours. 09/24/12   Adlih Moreno-Coll, MD  clarithromycin (BIAXIN) 250 MG/5ML suspension Take 10 mLs (500 mg total) by mouth 2 (two) times daily. Days 6-10 11/22/13   Liam Graham, PA-C  fluticasone (VERAMYST) 27.5 MCG/SPRAY nasal spray Place 2 sprays into the nose daily.    Historical Provider, MD  HYDROcodone-acetaminophen (HYCET) 7.5-325 mg/15 ml solution Take 10 mLs by mouth every 6 (six) hours as needed for moderate pain or severe pain. 11/22/13   Freeman Caldron Saydie Gerdts, PA-C  levofloxacin (LEVAQUIN) 500 MG tablet Take 1 tablet (500 mg total) by mouth daily. 11/05/13   Billy Fischer, MD  lisinopril-hydrochlorothiazide (PRINZIDE,ZESTORETIC) 10-12.5 MG per tablet Take 1 tablet by mouth daily.    Historical Provider, MD  methylPREDNISolone (MEDROL DOSEPAK) 4 MG tablet follow package  directions, start on wed, take until finished. 11/05/13   Billy Fischer, MD  metroNIDAZOLE (FLAGYL) 50 mg/ml oral suspension Take 10 mLs (500 mg total) by mouth 2 (two) times daily. Days 6-10 11/22/13   Liam Graham, PA-C  pravastatin (PRAVACHOL) 20 MG tablet Take 20 mg by mouth daily.    Historical Provider, MD  predniSONE (DELTASONE) 20 MG tablet 2 tabs by mouth daily for 5 days 09/24/12   Adlih Moreno-Coll, MD   BP 125/76  Pulse 79  Temp(Src) 98.7 F (37.1 C) (Oral)  Resp  16  SpO2 100% Physical Exam  Nursing note and vitals reviewed. Constitutional: She is oriented to person, place, and time. Vital signs are normal. She appears well-developed and well-nourished. No distress.  HENT:  Head: Normocephalic and atraumatic.  Eyes: Conjunctivae are normal. Right eye exhibits no discharge. Left eye exhibits no discharge.  Neck: Normal range of motion. Neck supple.  Cardiovascular: Normal rate, regular rhythm, normal heart sounds and intact distal pulses.   Pulmonary/Chest: Effort normal and breath sounds normal. No respiratory distress.  Abdominal: Soft. Bowel sounds are normal. She exhibits no distension and no mass. There is no hepatosplenomegaly. There is tenderness in the right lower quadrant, epigastric area, suprapubic area and left upper quadrant. There is no rebound, no guarding and no CVA tenderness.  Lymphadenopathy:    She has no cervical adenopathy.  Neurological: She is alert and oriented to person, place, and time. She has normal strength. Coordination normal.  Skin: Skin is warm and dry. Rash noted. Rash is papular (erythematous confluence of papules within a dermatomal distribution around the left flank). She is not diaphoretic.  Psychiatric: She has a normal mood and affect. Judgment normal.    ED Course  Procedures (including critical care time) Labs Review Labs Reviewed  CBC WITH DIFFERENTIAL - Abnormal; Notable for the following:    Neutrophils Relative % 42 (*)    Lymphocytes Relative 48 (*)    All other components within normal limits  COMPREHENSIVE METABOLIC PANEL - Abnormal; Notable for the following:    Total Protein 8.6 (*)    GFR calc non Af Amer 75 (*)    GFR calc Af Amer 87 (*)    All other components within normal limits  POCT H PYLORI SCREEN - Abnormal; Notable for the following:    H. PYLORI SCREEN, POC POSITIVE (*)    All other components within normal limits  LIPASE, BLOOD    Imaging Review No results found.   MDM    1. Pain of upper abdomen   2. Helicobacter pylori gastritis   3. Shingles    H. pylori is positive, treat with sequential therapy. She is 5 days out from the onset of her shingles rash, would not be very likely to have any benefit from antivirals at this time. Continue omeprazole for 3 months. Followup when necessary  Discharge Medication List as of 11/22/2013  5:27 PM    START taking these medications   Details  amoxicillin (AMOXIL) 400 MG/5ML suspension Take 12.5 mLs (1,000 mg total) by mouth 2 (two) times daily., Starting 11/22/2013, Last dose on Fri 11/29/13, Print    clarithromycin (BIAXIN) 250 MG/5ML suspension Take 10 mLs (500 mg total) by mouth 2 (two) times daily. Days 6-10, Starting 11/22/2013, Until Discontinued, Print    HYDROcodone-acetaminophen (HYCET) 7.5-325 mg/15 ml solution Take 10 mLs by mouth every 6 (six) hours as needed for moderate pain or severe pain., Starting 11/22/2013, Until Discontinued, Print  metroNIDAZOLE (FLAGYL) 50 mg/ml oral suspension Take 10 mLs (500 mg total) by mouth 2 (two) times daily. Days 6-10, Starting 11/22/2013, Until Discontinued, [place] Radin Raptis, PA-C 11/22/13 1814

## 2013-11-22 NOTE — Discharge Instructions (Signed)
Shingles Shingles (herpes zoster) is an infection that is caused by the same virus that causes chickenpox (varicella). The infection causes a painful skin rash and fluid-filled blisters, which eventually break open, crust over, and heal. It may occur in any area of the body, but it usually affects only one side of the body or face. The pain of shingles usually lasts about 1 month. However, some people with shingles may develop long-term (chronic) pain in the affected area of the body. Shingles often occurs many years after the person had chickenpox. It is more common:  In people older than 50 years.  In people with weakened immune systems, such as those with HIV, AIDS, or cancer.  In people taking medicines that weaken the immune system, such as transplant medicines.  In people under great stress. CAUSES  Shingles is caused by the varicella zoster virus (VZV), which also causes chickenpox. After a person is infected with the virus, it can remain in the person's body for years in an inactive state (dormant). To cause shingles, the virus reactivates and breaks out as an infection in a nerve root. The virus can be spread from person to person (contagious) through contact with open blisters of the shingles rash. It will only spread to people who have not had chickenpox. When these people are exposed to the virus, they may develop chickenpox. They will not develop shingles. Once the blisters scab over, the person is no longer contagious and cannot spread the virus to others. SIGNS AND SYMPTOMS  Shingles shows up in stages. The initial symptoms may be pain, itching, and tingling in an area of the skin. This pain is usually described as burning, stabbing, or throbbing.In a few days or weeks, a painful red rash will appear in the area where the pain, itching, and tingling were felt. The rash is usually on one side of the body in a band or belt-like pattern. Then, the rash usually turns  into fluid-filled blisters. They will scab over and dry up in approximately 2-3 weeks. Flu-like symptoms may also occur with the initial symptoms, the rash, or the blisters. These may include:  Fever.  Chills.  Headache.  Upset stomach. DIAGNOSIS  Your health care provider will perform a skin exam to diagnose shingles. Skin scrapings or fluid samples may also be taken from the blisters. This sample will be examined under a microscope or sent to a lab for further testing. TREATMENT  There is no specific cure for shingles. Your health care provider will likely prescribe medicines to help you manage the pain, recover faster, and avoid long-term problems. This may include antiviral drugs, anti-inflammatory drugs, and pain medicines. HOME CARE INSTRUCTIONS   Take a cool bath or apply cool compresses to the area of the rash or blisters as directed. This may help with the pain and itching.   Take medicines only as directed by your health care provider.   Rest as directed by your health care provider.  Keep your rash and blisters clean with mild soap and cool water or as directed by your health care provider.  Do not pick your blisters or scratch your rash. Apply an anti-itch cream or numbing creams to the affected area as directed by your health care provider.  Keep your shingles rash covered with a loose bandage (dressing).  Avoid skin contact with:  Babies.   Pregnant women.   Children with eczema.   Elderly people with  transplants.   People with chronic illnesses, such as leukemia or AIDS.   Wear loose-fitting clothing to help ease the pain of material rubbing against the rash.  Keep all follow-up visits as directed by your health care provider.If the area involved is on your face, you may receive a referral for a specialist, such as an eye doctor (ophthalmologist) or an ear, nose, and throat (ENT) doctor. Keeping all follow-up visits will help you avoid eye problems,  chronic pain, or disability.  SEEK IMMEDIATE MEDICAL CARE IF:   You have facial pain, pain around the eye area, or loss of feeling on one side of your face.  You have ear pain or ringing in your ear.  You have loss of taste.  Your pain is not relieved with prescribed medicines.   Your redness or swelling spreads.   You have more pain and swelling.  Your condition is worsening or has changed.   You have a fever. MAKE SURE YOU:  Understand these instructions.  Will watch your condition.  Will get help right away if you are not doing well or get worse. Document Released: 01/17/2005 Document Revised: 06/03/2013 Document Reviewed: 09/01/2011 [facility] Patient Information 2015 [place], [place]. This information is not intended to replace advice given to you by your health care provider. Make sure you discuss any questions you have with your health care provider.  Gastritis, Adult Gastritis is soreness and swelling (inflammation) of the lining of the stomach. Gastritis can develop as a sudden onset (acute) or long-term (chronic) condition. If gastritis is not treated, it can lead to stomach bleeding and ulcers. CAUSES  Gastritis occurs when the stomach lining is weak or damaged. Digestive juices from the stomach then inflame the weakened stomach lining. The stomach lining may be weak or damaged due to viral or bacterial infections. One common bacterial infection is the Helicobacter pylori infection. Gastritis can also result from excessive alcohol consumption, taking certain medicines, or having too much acid in the stomach.  SYMPTOMS  In some cases, there are no symptoms. When symptoms are present, they may include:  Pain or a burning sensation in the upper abdomen.  Nausea.  Vomiting.  An uncomfortable feeling of fullness after eating. DIAGNOSIS  Your caregiver may suspect you have gastritis based on your symptoms and a physical exam. To determine the cause of your gastritis,  your caregiver may perform the following:  Blood or stool tests to check for the H pylori bacterium.  Gastroscopy. A thin, flexible tube (endoscope) is passed down the esophagus and into the stomach. The endoscope has a light and camera on the end. Your caregiver uses the endoscope to view the inside of the stomach.  Taking a tissue sample (biopsy) from the stomach to examine under a microscope. TREATMENT  Depending on the cause of your gastritis, medicines may be prescribed. If you have a bacterial infection, such as an H pylori infection, antibiotics may be given. If your gastritis is caused by too much acid in the stomach, H2 blockers or antacids may be given. Your caregiver may recommend that you stop taking aspirin, ibuprofen, or other nonsteroidal anti-inflammatory drugs (NSAIDs). HOME CARE INSTRUCTIONS  Only take over-the-counter or prescription medicines as directed by your caregiver.  If you were given antibiotic medicines, take them as directed. Finish them even if you start to feel better.  Drink enough fluids to keep your urine clear or pale yellow.  Avoid foods and drinks that make your symptoms worse, such as:  Caffeine  or alcoholic drinks.  Chocolate.  Peppermint or mint flavorings.  Garlic and onions.  Spicy foods.  Citrus fruits, such as oranges, lemons, or limes.  Tomato-based foods such as sauce, chili, salsa, and pizza.  Fried and fatty foods.  Eat small, frequent meals instead of large meals. SEEK IMMEDIATE MEDICAL CARE IF:   You have black or dark red stools.  You vomit blood or material that looks like coffee grounds.  You are unable to keep fluids down.  Your abdominal pain gets worse.  You have a fever.  You do not feel better after 1 week.  You have any other questions or concerns. MAKE SURE YOU:  Understand these instructions.  Will watch your condition.  Will get help right away if you are not doing well or get worse. Document  Released: 01/11/2001 Document Revised: 07/19/2011 Document Reviewed: 03/02/2011 [facility] Patient Information 2015 [place], [place]. This information is not intended to replace advice given to you by your health care provider. Make sure you discuss any questions you have with your health care provider.

## 2013-11-24 NOTE — ED Provider Notes (Signed)
Medical screening examination/treatment/procedure(s) were performed by resident physician or non-physician practitioner and as supervising physician I was immediately available for consultation/collaboration.   Pauline Good MD.   Billy Fischer, MD 11/24/13 [phone]

## 2015-02-13 ENCOUNTER — Other Ambulatory Visit: Payer: Self-pay

## 2015-02-13 DIAGNOSIS — Z1231 Encounter for screening mammogram for malignant neoplasm of breast: Secondary | ICD-10-CM

## 2015-02-26 ENCOUNTER — Ambulatory Visit
Admission: RE | Admit: 2015-02-26 | Discharge: 2015-02-26 | Disposition: A | Discharge status: Home / Self Care | Payer: BLUE CROSS/BLUE SHIELD | Source: Ambulatory Visit

## 2015-02-26 DIAGNOSIS — Z1231 Encounter for screening mammogram for malignant neoplasm of breast: Secondary | ICD-10-CM

## 2015-05-10 ENCOUNTER — Inpatient Hospital Stay (HOSPITAL_COMMUNITY)
Admission: EM | Admit: 2015-05-10 | Discharge: 2015-05-14 | DRG: 040 | Disposition: A | Discharge status: Home / Self Care | Payer: BLUE CROSS/BLUE SHIELD | Attending: Internal Medicine | Admitting: Internal Medicine

## 2015-05-10 ENCOUNTER — Encounter (HOSPITAL_COMMUNITY): Payer: Self-pay | Admitting: Emergency Medicine

## 2015-05-10 DIAGNOSIS — N390 Urinary tract infection, site not specified: Secondary | ICD-10-CM | POA: Diagnosis present

## 2015-05-10 DIAGNOSIS — Z853 Personal history of malignant neoplasm of breast: Secondary | ICD-10-CM

## 2015-05-10 DIAGNOSIS — J45909 Unspecified asthma, uncomplicated: Secondary | ICD-10-CM | POA: Diagnosis present

## 2015-05-10 DIAGNOSIS — R911 Solitary pulmonary nodule: Secondary | ICD-10-CM | POA: Diagnosis present

## 2015-05-10 DIAGNOSIS — Z7952 Long term (current) use of systemic steroids: Secondary | ICD-10-CM

## 2015-05-10 DIAGNOSIS — R253 Fasciculation: Secondary | ICD-10-CM | POA: Diagnosis present

## 2015-05-10 DIAGNOSIS — Z7951 Long term (current) use of inhaled steroids: Secondary | ICD-10-CM

## 2015-05-10 DIAGNOSIS — Z792 Long term (current) use of antibiotics: Secondary | ICD-10-CM

## 2015-05-10 DIAGNOSIS — R590 Localized enlarged lymph nodes: Secondary | ICD-10-CM | POA: Diagnosis present

## 2015-05-10 DIAGNOSIS — C7931 Secondary malignant neoplasm of brain: Principal | ICD-10-CM | POA: Diagnosis present

## 2015-05-10 DIAGNOSIS — R29810 Facial weakness: Secondary | ICD-10-CM | POA: Diagnosis present

## 2015-05-10 DIAGNOSIS — Z9889 Other specified postprocedural states: Secondary | ICD-10-CM

## 2015-05-10 DIAGNOSIS — F17210 Nicotine dependence, cigarettes, uncomplicated: Secondary | ICD-10-CM | POA: Diagnosis present

## 2015-05-10 DIAGNOSIS — Z803 Family history of malignant neoplasm of breast: Secondary | ICD-10-CM

## 2015-05-10 DIAGNOSIS — Z800 Family history of malignant neoplasm of digestive organs: Secondary | ICD-10-CM

## 2015-05-10 DIAGNOSIS — J42 Unspecified chronic bronchitis: Secondary | ICD-10-CM | POA: Diagnosis present

## 2015-05-10 DIAGNOSIS — Z6822 Body mass index (BMI) 22.0-22.9, adult: Secondary | ICD-10-CM

## 2015-05-10 DIAGNOSIS — R4701 Aphasia: Secondary | ICD-10-CM | POA: Diagnosis not present

## 2015-05-10 DIAGNOSIS — R509 Fever, unspecified: Secondary | ICD-10-CM | POA: Insufficient documentation

## 2015-05-10 DIAGNOSIS — Z419 Encounter for procedure for purposes other than remedying health state, unspecified: Secondary | ICD-10-CM | POA: Insufficient documentation

## 2015-05-10 DIAGNOSIS — G936 Cerebral edema: Secondary | ICD-10-CM | POA: Diagnosis present

## 2015-05-10 DIAGNOSIS — G4089 Other seizures: Secondary | ICD-10-CM | POA: Diagnosis present

## 2015-05-10 DIAGNOSIS — F17200 Nicotine dependence, unspecified, uncomplicated: Secondary | ICD-10-CM | POA: Diagnosis present

## 2015-05-10 DIAGNOSIS — R569 Unspecified convulsions: Secondary | ICD-10-CM

## 2015-05-10 DIAGNOSIS — R634 Abnormal weight loss: Secondary | ICD-10-CM | POA: Diagnosis present

## 2015-05-10 DIAGNOSIS — R930 Abnormal findings on diagnostic imaging of skull and head, not elsewhere classified: Secondary | ICD-10-CM | POA: Diagnosis present

## 2015-05-10 DIAGNOSIS — I10 Essential (primary) hypertension: Secondary | ICD-10-CM | POA: Diagnosis present

## 2015-05-10 DIAGNOSIS — E785 Hyperlipidemia, unspecified: Secondary | ICD-10-CM | POA: Diagnosis present

## 2015-05-10 DIAGNOSIS — Z79899 Other long term (current) drug therapy: Secondary | ICD-10-CM

## 2015-05-10 DIAGNOSIS — E440 Moderate protein-calorie malnutrition: Secondary | ICD-10-CM | POA: Diagnosis present

## 2015-05-10 DIAGNOSIS — C3411 Malignant neoplasm of upper lobe, right bronchus or lung: Secondary | ICD-10-CM | POA: Diagnosis present

## 2015-05-10 HISTORY — DX: Unspecified asthma, uncomplicated: J45.909

## 2015-05-10 HISTORY — DX: Headache: R51

## 2015-05-10 HISTORY — DX: Unspecified convulsions: R56.9

## 2015-05-10 HISTORY — DX: Family history of other specified conditions: Z84.89

## 2015-05-10 HISTORY — DX: Headache, unspecified: R51.9

## 2015-05-10 HISTORY — DX: Unspecified chronic bronchitis: J42

## 2015-05-10 HISTORY — DX: Hyperlipidemia, unspecified: E78.5

## 2015-05-10 LAB — COMPREHENSIVE METABOLIC PANEL
ALT: 10 U/L — AB (ref 14–54)
ANION GAP: 11 (ref 5–15)
AST: 17 U/L (ref 15–41)
Albumin: 2.6 g/dL — ABNORMAL LOW (ref 3.5–5.0)
Alkaline Phosphatase: 68 U/L (ref 38–126)
BUN: 14 mg/dL (ref 6–20)
CHLORIDE: 101 mmol/L (ref 101–111)
CO2: 23 mmol/L (ref 22–32)
Calcium: 9.5 mg/dL (ref 8.9–10.3)
Creatinine, Ser: 0.89 mg/dL (ref 0.44–1.00)
Glucose, Bld: 114 mg/dL — ABNORMAL HIGH (ref 65–99)
POTASSIUM: 4.4 mmol/L (ref 3.5–5.1)
SODIUM: 135 mmol/L (ref 135–145)
Total Bilirubin: 0.7 mg/dL (ref 0.3–1.2)
Total Protein: 7.5 g/dL (ref 6.5–8.1)

## 2015-05-10 LAB — DIFFERENTIAL
BASOS PCT: 1 %
Basophils Absolute: 0 10*3/uL (ref 0.0–0.1)
EOS ABS: 0.1 10*3/uL (ref 0.0–0.7)
EOS PCT: 1 %
Lymphocytes Relative: 25 %
Lymphs Abs: 1.9 10*3/uL (ref 0.7–4.0)
MONO ABS: 0.7 10*3/uL (ref 0.1–1.0)
Monocytes Relative: 9 %
NEUTROS ABS: 5 10*3/uL (ref 1.7–7.7)
Neutrophils Relative %: 64 %

## 2015-05-10 LAB — I-STAT CHEM 8, ED
BUN: 19 mg/dL (ref 6–20)
CREATININE: 0.8 mg/dL (ref 0.44–1.00)
Calcium, Ion: 1.21 mmol/L (ref 1.13–1.30)
Chloride: 101 mmol/L (ref 101–111)
Glucose, Bld: 107 mg/dL — ABNORMAL HIGH (ref 65–99)
HEMATOCRIT: 27 % — AB (ref 36.0–46.0)
Hemoglobin: 9.2 g/dL — ABNORMAL LOW (ref 12.0–15.0)
POTASSIUM: 4.3 mmol/L (ref 3.5–5.1)
SODIUM: 136 mmol/L (ref 135–145)
TCO2: 26 mmol/L (ref 0–100)

## 2015-05-10 LAB — PROTIME-INR
INR: 1.11 (ref 0.00–1.49)
PROTHROMBIN TIME: 14.5 s (ref 11.6–15.2)

## 2015-05-10 LAB — CBC
HCT: 25.2 % — ABNORMAL LOW (ref 36.0–46.0)
Hemoglobin: 8 g/dL — ABNORMAL LOW (ref 12.0–15.0)
MCH: 24.6 pg — ABNORMAL LOW (ref 26.0–34.0)
MCHC: 31.7 g/dL (ref 30.0–36.0)
MCV: 77.5 fL — AB (ref 78.0–100.0)
PLATELETS: 466 10*3/uL — AB (ref 150–400)
RBC: 3.25 MIL/uL — ABNORMAL LOW (ref 3.87–5.11)
RDW: 13.1 % (ref 11.5–15.5)
WBC: 7.6 10*3/uL (ref 4.0–10.5)

## 2015-05-10 LAB — APTT: aPTT: 40 seconds — ABNORMAL HIGH (ref 24–37)

## 2015-05-10 MED ORDER — ONDANSETRON HCL 4 MG/2ML IJ SOLN
4.0000 mg | Freq: Once | INTRAMUSCULAR | Status: AC
  Administered 2015-05-11: 4 mg via INTRAVENOUS
  Filled 2015-05-10: qty 2

## 2015-05-10 MED ORDER — ACETAMINOPHEN 500 MG PO TABS
1000.0000 mg | ORAL_TABLET | Freq: Once | ORAL | Status: AC
  Administered 2015-05-11: 1000 mg via ORAL
  Filled 2015-05-10: qty 2

## 2015-05-10 NOTE — ED Notes (Signed)
Brought from home via ems for intermmitant dysphagia that she says comes and goes with drooling.  Per EMS minimal drooping noted to left side of face.  Alert and oriented at this time.  Following directions well.  Reporting nausea that just started at this time.

## 2015-05-10 NOTE — ED Provider Notes (Signed)
By signing my name below, I, Michiel Cowboy, attest that this documentation has been prepared under the direction and in the presence of  Electronically Signed: Michiel Cowboy, ED Scribe. 05/11/2015. 12:07 AM.    TIME SEEN: 11:35 PM   CHIEF COMPLAINT:    HPI:  HPI Comments: Rose [name] is a 65 y.o. female with a history of HTN, tobacco use and HLD,  who presents to the Emergency Department via EMS complaining of an episode of difficulty speaking that started  about 8PM. The episode lasted ~ 7-10 minutes. Pt states she also had difficulty swallowing, drooling, and facial twitching. Per ems, pt had left sided facial drooping. She denies h/o CVA. At this time pt is also complaining of nausea. Pt denies headache, CP, abdominal pain, cough, vomiting and diarrhea. She denies h/o DM, and heart disease. She has been taking a new antibiotic since x 3 days due to UTI; she cannot recall the name. Pt states she went to her PCP because she was experiencing generalized body aches and hot/cold chills. Pt is a current everyday smoker. She takes 81 mg ASA daily; no other anti-coagulants.   PCP: Marlou Sa  ROS: See HPI Constitutional: fever  Eyes: no drainage  ENT: no runny nose   Cardiovascular:  no chest pain  Resp: no SOB  GI: no vomiting GU: no dysuria Integumentary: no rash  Allergy: no hives  Musculoskeletal: no leg swelling  ROS otherwise negative  PAST MEDICAL HISTORY/PAST SURGICAL HISTORY:  Past Medical History  Diagnosis Date  . Hypertension   . Hyperlipidemia   . Asthma   . Asthmatic bronchitis     MEDICATIONS:  Prior to Admission medications   Medication Sig Start Date End Date Taking? Authorizing Provider  albuterol (PROVENTIL HFA;VENTOLIN HFA) 108 (90 BASE) MCG/ACT inhaler Inhale 1-2 puffs into the lungs every 6 (six) hours as needed for wheezing. 09/24/12   Adlih Moreno-Coll, MD  albuterol (PROVENTIL HFA;VENTOLIN HFA) 108 (90 BASE) MCG/ACT inhaler Inhale 2 puffs into the lungs every 6  (six) hours as needed for wheezing or shortness of breath. 11/05/13   Billy Fischer, MD  atenolol (TENORMIN) 50 MG tablet Take 50 mg by mouth daily.    Historical Provider, MD  azithromycin (ZITHROMAX) 250 MG tablet Take 1 tablet (250 mg total) by mouth daily. Take first 2 tablets together, then 1 every day until finished. 09/24/12   Adlih Moreno-Coll, MD  benzonatate (TESSALON) 100 MG capsule Take 1 capsule (100 mg total) by mouth every 8 (eight) hours. 09/24/12   Adlih Moreno-Coll, MD  clarithromycin (BIAXIN) 250 MG/5ML suspension Take 10 mLs (500 mg total) by mouth 2 (two) times daily. Days 6-10 11/22/13   Liam Graham, PA-C  fluticasone (VERAMYST) 27.5 MCG/SPRAY nasal spray Place 2 sprays into the nose daily.    Historical Provider, MD  HYDROcodone-acetaminophen (HYCET) 7.5-325 mg/15 ml solution Take 10 mLs by mouth every 6 (six) hours as needed for moderate pain or severe pain. 11/22/13   Freeman Caldron Baker, PA-C  levofloxacin (LEVAQUIN) 500 MG tablet Take 1 tablet (500 mg total) by mouth daily. 11/05/13   Billy Fischer, MD  lisinopril-hydrochlorothiazide (PRINZIDE,ZESTORETIC) 10-12.5 MG per tablet Take 1 tablet by mouth daily.    Historical Provider, MD  methylPREDNISolone (MEDROL DOSEPAK) 4 MG tablet follow package directions, start on wed, take until finished. 11/05/13   Billy Fischer, MD  metroNIDAZOLE (FLAGYL) 50 mg/ml oral suspension Take 10 mLs (500 mg total) by mouth 2 (two) times daily. Days 6-10  11/22/13   Freeman Caldron Baker, PA-C  pravastatin (PRAVACHOL) 20 MG tablet Take 20 mg by mouth daily.    Historical Provider, MD  predniSONE (DELTASONE) 20 MG tablet 2 tabs by mouth daily for 5 days 09/24/12   Randa Spike, MD    ALLERGIES:  No Known Allergies  SOCIAL HISTORY:  Social History  Substance Use Topics  . Smoking status: Current Every Day Smoker -- 0.50 packs/day  . Smokeless tobacco: Not on file  . Alcohol Use: No    FAMILY HISTORY: No family history on file.  EXAM: BP  118/59 mmHg  Pulse 93  Temp(Src) 99.8 F (37.7 C) (Oral)  Resp 33  Ht '5\' 1"'$  (1.549 m)  Wt 121 lb (54.885 kg)  BMI 22.87 kg/m2  SpO2 99% CONSTITUTIONAL: Alert and oriented x3 and responds appropriately to questions. Well-appearing; well-nourished, low-grade temperature of 99.9 orally, nontoxic HEAD: Normocephalic EYES: Conjunctivae clear, PERRL ENT: normal nose; no rhinorrhea; moist mucous membranes NECK: Supple, no meningismus, no LAD  CARD: RRR; S1 and S2 appreciated; no murmurs, no clicks, no rubs, no gallops RESP: Normal chest excursion without splinting or tachypnea; breath sounds clear and equal bilaterally; no wheezes, no rhonchi, no rales, no hypoxia or respiratory distress, speaking full sentences ABD/GI: Normal bowel sounds; non-distended; soft, non-tender, no rebound, no guarding, no peritoneal signs BACK:  The back appears normal and is non-tender to palpation, there is no CVA tenderness EXT: Normal ROM in all joints; non-tender to palpation; no edema; normal capillary refill; no cyanosis, no calf tenderness or swelling    SKIN: Normal color for age and race; warm; no rash NEURO: Moves all extremities equally, sensation to light touch intact diffusely, cranial nerves II through XII intact strength 5/5 in all 4 extremities, no dysmmetria to finger to nose testing bilaterally, normal heel to shin test bilaterally  PSYCH: The patient's mood and manner are appropriate. Grooming and personal hygiene are appropriate.  MEDICAL DECISION MAKING: Patient here with symptoms of possible TIA. She is also had fevers, bodyaches for the past week and reports she was recently diagnosed with UTI and has been on antibiotics for 3 days by her PCP Dr. Marlou Sa. She is currently neurologically intact without complaints other than nausea. Does have a low-grade temperature but HD stable. Will obtain chest x-ray, urine, labs, head CT. Will also obtain a flu swab. I feel she will need admission for TIA workup.   Not a TPA candidate as NIHSS is 0 - symptoms have all resolved.  ED PROGRESS: 1:15 AM  D/w radiologist. He is concerned about possible brain metastasis versus multiple infarcts. He does not feel this is a primary mass given her multiple concerning areas or abscess. Will consult neurology on call. Discussed with Dr. Alcario Drought with hospitalist service who agrees on admission to inpatient, telemetry bed. We will obtain an MRI of her brain with and without contrast per radiology recommendations.  1:35 AM  D/w Dr. Nicole Kindred with neurology who will see patient in consult.     Blood and urine cultures pending.  Lactic normal.  Flu pending.  CXR shows no infiltrate.  UA shows no urine.  She is not septic.  No tachypnea or abnormal vitals on my exam or reassessment.  Will hold abx at this time as no source.     EKG Interpretation  Date/Time:  Sunday May 10 2015 22:18:51 EDT Ventricular Rate:  99 PR Interval:  178 QRS Duration: 92 QT Interval:  351 QTC Calculation: 450 R Axis:  44 Text Interpretation:  Sinus rhythm Consider left atrial enlargement RSR' in V1 or V2, probably normal variant Minimal ST elevation, anterior leads No significant change since last tracing in 2015 Confirmed by Auriana Scalia,  DO, Channie Bostick [phone]) on 05/10/2015 11:11:22 PM       I reviewed all nursing notes, vitals, pertinent old records, EKGs, labs, imaging (as available).  I personally performed the services described in this documentation, which was scribed in my presence. The recorded information has been reviewed and is accurate.    [place], DO 05/11/15 [phone]

## 2015-05-11 ENCOUNTER — Encounter (HOSPITAL_COMMUNITY): Payer: Self-pay | Admitting: Radiology

## 2015-05-11 ENCOUNTER — Inpatient Hospital Stay (HOSPITAL_COMMUNITY): Payer: BLUE CROSS/BLUE SHIELD

## 2015-05-11 ENCOUNTER — Emergency Department (HOSPITAL_COMMUNITY): Payer: BLUE CROSS/BLUE SHIELD

## 2015-05-11 DIAGNOSIS — R509 Fever, unspecified: Secondary | ICD-10-CM | POA: Diagnosis not present

## 2015-05-11 DIAGNOSIS — J45909 Unspecified asthma, uncomplicated: Secondary | ICD-10-CM | POA: Diagnosis present

## 2015-05-11 DIAGNOSIS — R29810 Facial weakness: Secondary | ICD-10-CM | POA: Diagnosis present

## 2015-05-11 DIAGNOSIS — J42 Unspecified chronic bronchitis: Secondary | ICD-10-CM | POA: Diagnosis present

## 2015-05-11 DIAGNOSIS — Z803 Family history of malignant neoplasm of breast: Secondary | ICD-10-CM | POA: Diagnosis not present

## 2015-05-11 DIAGNOSIS — G4089 Other seizures: Secondary | ICD-10-CM | POA: Diagnosis present

## 2015-05-11 DIAGNOSIS — R599 Enlarged lymph nodes, unspecified: Secondary | ICD-10-CM | POA: Diagnosis not present

## 2015-05-11 DIAGNOSIS — Z419 Encounter for procedure for purposes other than remedying health state, unspecified: Secondary | ICD-10-CM | POA: Diagnosis not present

## 2015-05-11 DIAGNOSIS — F17200 Nicotine dependence, unspecified, uncomplicated: Secondary | ICD-10-CM | POA: Diagnosis present

## 2015-05-11 DIAGNOSIS — G936 Cerebral edema: Secondary | ICD-10-CM | POA: Diagnosis present

## 2015-05-11 DIAGNOSIS — R911 Solitary pulmonary nodule: Secondary | ICD-10-CM | POA: Diagnosis present

## 2015-05-11 DIAGNOSIS — R930 Abnormal findings on diagnostic imaging of skull and head, not elsewhere classified: Secondary | ICD-10-CM | POA: Diagnosis present

## 2015-05-11 DIAGNOSIS — Z792 Long term (current) use of antibiotics: Secondary | ICD-10-CM | POA: Diagnosis not present

## 2015-05-11 DIAGNOSIS — C7931 Secondary malignant neoplasm of brain: Secondary | ICD-10-CM | POA: Diagnosis present

## 2015-05-11 DIAGNOSIS — R569 Unspecified convulsions: Secondary | ICD-10-CM | POA: Diagnosis not present

## 2015-05-11 DIAGNOSIS — C3411 Malignant neoplasm of upper lobe, right bronchus or lung: Secondary | ICD-10-CM | POA: Diagnosis present

## 2015-05-11 DIAGNOSIS — Z6822 Body mass index (BMI) 22.0-22.9, adult: Secondary | ICD-10-CM | POA: Diagnosis not present

## 2015-05-11 DIAGNOSIS — Z853 Personal history of malignant neoplasm of breast: Secondary | ICD-10-CM | POA: Diagnosis not present

## 2015-05-11 DIAGNOSIS — R634 Abnormal weight loss: Secondary | ICD-10-CM | POA: Diagnosis present

## 2015-05-11 DIAGNOSIS — Z7951 Long term (current) use of inhaled steroids: Secondary | ICD-10-CM | POA: Diagnosis not present

## 2015-05-11 DIAGNOSIS — Z800 Family history of malignant neoplasm of digestive organs: Secondary | ICD-10-CM | POA: Diagnosis not present

## 2015-05-11 DIAGNOSIS — N390 Urinary tract infection, site not specified: Secondary | ICD-10-CM | POA: Diagnosis present

## 2015-05-11 DIAGNOSIS — C801 Malignant (primary) neoplasm, unspecified: Secondary | ICD-10-CM | POA: Diagnosis not present

## 2015-05-11 DIAGNOSIS — F17210 Nicotine dependence, cigarettes, uncomplicated: Secondary | ICD-10-CM | POA: Diagnosis present

## 2015-05-11 DIAGNOSIS — Z79899 Other long term (current) drug therapy: Secondary | ICD-10-CM | POA: Diagnosis not present

## 2015-05-11 DIAGNOSIS — E440 Moderate protein-calorie malnutrition: Secondary | ICD-10-CM | POA: Diagnosis present

## 2015-05-11 DIAGNOSIS — I10 Essential (primary) hypertension: Secondary | ICD-10-CM | POA: Diagnosis present

## 2015-05-11 DIAGNOSIS — R4701 Aphasia: Secondary | ICD-10-CM | POA: Diagnosis present

## 2015-05-11 DIAGNOSIS — Z7952 Long term (current) use of systemic steroids: Secondary | ICD-10-CM | POA: Diagnosis not present

## 2015-05-11 DIAGNOSIS — E785 Hyperlipidemia, unspecified: Secondary | ICD-10-CM | POA: Diagnosis present

## 2015-05-11 DIAGNOSIS — R253 Fasciculation: Secondary | ICD-10-CM | POA: Diagnosis present

## 2015-05-11 LAB — URINALYSIS, ROUTINE W REFLEX MICROSCOPIC
BILIRUBIN URINE: NEGATIVE
Glucose, UA: NEGATIVE mg/dL
Hgb urine dipstick: NEGATIVE
KETONES UR: NEGATIVE mg/dL
Leukocytes, UA: NEGATIVE
NITRITE: NEGATIVE
PROTEIN: NEGATIVE mg/dL
SPECIFIC GRAVITY, URINE: 1.012 (ref 1.005–1.030)
pH: 6 (ref 5.0–8.0)

## 2015-05-11 LAB — INFLUENZA PANEL BY PCR (TYPE A & B)
H1N1 flu by pcr: NOT DETECTED
INFLAPCR: NEGATIVE
INFLBPCR: NEGATIVE

## 2015-05-11 LAB — I-STAT TROPONIN, ED: Troponin i, poc: 0 ng/mL (ref 0.00–0.08)

## 2015-05-11 LAB — I-STAT CG4 LACTIC ACID, ED: Lactic Acid, Venous: 0.82 mmol/L (ref 0.5–2.0)

## 2015-05-11 MED ORDER — SODIUM CHLORIDE 0.9 % IV SOLN
500.0000 mg | Freq: Two times a day (BID) | INTRAVENOUS | Status: DC
  Administered 2015-05-11 – 2015-05-12 (×2): 500 mg via INTRAVENOUS
  Filled 2015-05-11 (×5): qty 5

## 2015-05-11 MED ORDER — LEVETIRACETAM 500 MG/5ML IV SOLN
1000.0000 mg | Freq: Once | INTRAVENOUS | Status: AC
  Administered 2015-05-11: 1000 mg via INTRAVENOUS
  Filled 2015-05-11: qty 10

## 2015-05-11 MED ORDER — LOSARTAN POTASSIUM 50 MG PO TABS
50.0000 mg | ORAL_TABLET | Freq: Every day | ORAL | Status: DC
  Administered 2015-05-12: 50 mg via ORAL
  Filled 2015-05-11: qty 1

## 2015-05-11 MED ORDER — ALBUTEROL SULFATE HFA 108 (90 BASE) MCG/ACT IN AERS: 1.0000 | INHALATION_SPRAY | Freq: Four times a day (QID) | RESPIRATORY_TRACT | Status: DC | PRN

## 2015-05-11 MED ORDER — PRAVASTATIN SODIUM 20 MG PO TABS
20.0000 mg | ORAL_TABLET | Freq: Every day | ORAL | Status: DC
  Administered 2015-05-11 – 2015-05-14 (×4): 20 mg via ORAL
  Filled 2015-05-11 (×4): qty 1

## 2015-05-11 MED ORDER — DEXAMETHASONE SODIUM PHOSPHATE 4 MG/ML IJ SOLN
4.0000 mg | Freq: Four times a day (QID) | INTRAMUSCULAR | Status: DC
  Administered 2015-05-11 – 2015-05-14 (×11): 4 mg via INTRAVENOUS
  Filled 2015-05-11 (×12): qty 1

## 2015-05-11 MED ORDER — IOHEXOL 300 MG/ML  SOLN: 50.0000 mL | INTRAMUSCULAR | Status: AC

## 2015-05-11 MED ORDER — ATENOLOL 25 MG PO TABS: 50.0000 mg | ORAL_TABLET | Freq: Every day | ORAL | Status: DC

## 2015-05-11 MED ORDER — ALBUTEROL SULFATE (2.5 MG/3ML) 0.083% IN NEBU: 2.5000 mg | INHALATION_SOLUTION | Freq: Four times a day (QID) | RESPIRATORY_TRACT | Status: DC | PRN

## 2015-05-11 MED ORDER — MONTELUKAST SODIUM 10 MG PO TABS
10.0000 mg | ORAL_TABLET | Freq: Every day | ORAL | Status: DC
  Administered 2015-05-11 – 2015-05-14 (×4): 10 mg via ORAL
  Filled 2015-05-11 (×4): qty 1

## 2015-05-11 MED ORDER — ENSURE ENLIVE PO LIQD
237.0000 mL | Freq: Two times a day (BID) | ORAL | Status: DC
Start: 2015-05-12 — End: 2015-05-12
  Administered 2015-05-12: 237 mL via ORAL
  Filled 2015-05-11 (×4): qty 237

## 2015-05-11 MED ORDER — BARIUM SULFATE 2.1 % PO SUSP
ORAL | Status: AC
  Administered 2015-05-11: 13:00:00
  Filled 2015-05-11: qty 2

## 2015-05-11 MED ORDER — GADOBENATE DIMEGLUMINE 529 MG/ML IV SOLN
10.0000 mL | Freq: Once | INTRAVENOUS | Status: AC | PRN
  Administered 2015-05-11: 10 mL via INTRAVENOUS

## 2015-05-11 MED ORDER — FLUTICASONE PROPIONATE 50 MCG/ACT NA SUSP
1.0000 | Freq: Every day | NASAL | Status: DC
  Administered 2015-05-11 – 2015-05-14 (×4): 1 via NASAL
  Filled 2015-05-11: qty 16

## 2015-05-11 MED ORDER — BARIUM SULFATE 2.1 % PO SUSP
450.0000 mL | Freq: Two times a day (BID) | ORAL | Status: DC
Start: 2015-05-11 — End: 2015-05-11
  Administered 2015-05-11: 450 mL via ORAL

## 2015-05-11 MED ORDER — LOSARTAN POTASSIUM-HCTZ 50-12.5 MG PO TABS: 1.0000 | ORAL_TABLET | Freq: Every day | ORAL | Status: DC

## 2015-05-11 MED ORDER — IOPAMIDOL (ISOVUE-300) INJECTION 61%
INTRAVENOUS | Status: AC
  Administered 2015-05-11: 100 mL
  Filled 2015-05-11: qty 100

## 2015-05-11 MED ORDER — CYCLOSPORINE 0.05 % OP EMUL
1.0000 [drp] | Freq: Two times a day (BID) | OPHTHALMIC | Status: DC
  Administered 2015-05-11 – 2015-05-14 (×7): 1 [drp] via OPHTHALMIC
  Filled 2015-05-11 (×8): qty 1

## 2015-05-11 MED ORDER — HYDROCHLOROTHIAZIDE 12.5 MG PO CAPS: 12.5000 mg | ORAL_CAPSULE | Freq: Every day | ORAL | Status: DC

## 2015-05-11 MED ORDER — SODIUM CHLORIDE 0.9 % IV SOLN
INTRAVENOUS | Status: AC
  Administered 2015-05-11: 12:00:00 via INTRAVENOUS

## 2015-05-11 NOTE — Progress Notes (Addendum)
[facility] PROGRESS NOTE  Rose [name] KDX:[id number] DOB: 11/01/1950 DOA: 05/10/2015  PCP: Rogers Blocker, MD  Brief HPI: 65 year old African-American female with a past medical history of hypertension, hyperlipidemia, remote breast cancer (1992) status post lumpectomy who was being treated for a urinary tract infection recently with ciprofloxacin, who presented with the 7-10 minute episode of difficulty speaking, facial twitching. Patient had an abnormal CT scan of the head. She was admitted for further evaluation.  Past medical history:  Past Medical History  Diagnosis Date  . Hypertension   . Hyperlipidemia   . Asthma   . Asthmatic bronchitis     Consultants: Neurology  Procedures:  None  Antibiotics: None  Subjective: Patient denies any headaches. No visual disturbances. No further facial twitching. Denies any weakness in her arms or legs. Reports weight loss of 10 pounds over the last 6 months. She denies any blood in the stool or black colored stool. She has never had a colonoscopy.  Objective: Vital Signs  Filed Vitals:   05/11/15 0645 05/11/15 0715 05/11/15 0730 05/11/15 0855  BP: 98/63 1'02/60 98/57 97/42 '$  Pulse: 79 70 71 78  Temp:    99.7 F (37.6 C)  TempSrc:    Oral  Resp:  36 22 20  Height:      Weight:      SpO2: 99% 99% 99% 98%   No intake or output data in the 24 hours ending 05/11/15 1056 Filed Weights   05/10/15 2227  Weight: 54.885 kg (121 lb)    General appearance: alert, cooperative, appears stated age and no distress Head: Normocephalic, without obvious abnormality, atraumatic Resp: clear to auscultation bilaterally Cardio: regular rate and rhythm, S1, S2 normal, no murmur, click, rub or gallop GI: Abdomen is soft. Mild, vague diffuse tenderness without any rebound, rigidity or guarding. No masses or organomegaly. Bowel sounds are present. Extremities: extremities normal, atraumatic, no cyanosis or edema Neurologic: Awake and alert.  No obvious facial asymmetry noted at this time. Motor strength equal bilateral upper and lower extremities.  Lab Results:  Basic Metabolic Panel:  Recent Labs Lab 05/10/15 2305 05/10/15 2313  NA 135 136  K 4.4 4.3  CL 101 101  CO2 23  --   GLUCOSE 114* 107*  BUN 14 19  CREATININE 0.89 0.80  CALCIUM 9.5  --    Liver Function Tests:  Recent Labs Lab 05/10/15 2305  AST 17  ALT 10*  ALKPHOS 68  BILITOT 0.7  PROT 7.5  ALBUMIN 2.6*   CBC:  Recent Labs Lab 05/10/15 2305 05/10/15 2313  WBC 7.6  --   NEUTROABS 5.0  --   HGB 8.0* 9.2*  HCT 25.2* 27.0*  MCV 77.5*  --   PLT 466*  --     Recent Results (from the past 240 hour(s))  Blood culture (routine x 2)     Status: None (Preliminary result)   Collection Time: 05/11/15  1:05 AM  Result Value Ref Range Status   Specimen Description BLOOD RIGHT HAND  Final   Special Requests BOTTLES DRAWN AEROBIC AND ANAEROBIC 5CC  Final   Culture PENDING  Incomplete   Report Status PENDING  Incomplete      Studies/Results: Dg Chest 2 View  05/11/2015  CLINICAL DATA:  Acute onset of fever and abdominal discomfort. Initial encounter. EXAM: CHEST  2 VIEW COMPARISON:  Chest radiograph performed 09/24/2012 FINDINGS: A 2.1 cm nodule is noted near the right lung apex. Peribronchial thickening is noted. No pleural  effusion or pneumothorax is seen. The heart remains normal in size. No acute osseous abnormalities are seen. Scattered clips are noted at the right axilla. Clips are noted within the right upper quadrant, reflecting prior cholecystectomy. IMPRESSION: 1. 2.1 cm nodule near the right lung apex. CT of the chest would be helpful for further evaluation, when and as deemed clinically appropriate. 2. Peribronchial thickening noted. Electronically Signed   By: Garald Balding M.D.   On: 05/11/2015 00:36   Ct Head Wo Contrast  05/11/2015  CLINICAL DATA:  10 minutes episode of slurred speech today. Onset at 20:30. EXAM: CT HEAD WITHOUT  CONTRAST TECHNIQUE: Contiguous axial images were obtained from the base of the skull through the vertex without intravenous contrast. COMPARISON:  None. FINDINGS: There is extensive hypodensity and sulcal effacement in the right frontal lobe, primarily involving the white matter but some gyral involvement is present. A smaller focus of hypodensity is present in the posterior left temporal lobe, with slight sulcal effacement. Patchy periventricular white matter hypodensity is present in the left frontal lobe. These areas of hypodensity may represent subacute multifocal infarction. However, vasogenic edema associated with cerebral masses is also a consideration. There is no midline shift. Basal cisterns are patent. There is no intracranial hemorrhage. There is no extra-axial fluid collection. No bony abnormality is evident. IMPRESSION: Large area of hypodensity in the right frontal lobe and smaller hypodense focus in the posterior left temporal lobe, both with sulcal effacement but no midline shift. These may represent nonhemorrhagic subacute infarctions, but brain MRI is recommended to exclude cerebral masses. These results were called by telephone at the time of interpretation on 05/11/2015 at 12:44 am to Dr. Leonides Schanz, who verbally acknowledged these results. Electronically Signed   By: Andreas Newport M.D.   On: 05/11/2015 00:51   Mr Jeri Cos ZO Contrast  05/11/2015  CLINICAL DATA:  Brief episode of difficulty speaking, facial twitching, drooling LEFT facial droop. Low-grade fever and body aches this week. On treatment for urinary tract infection. Assess for med passed a cyst. EXAM: MRI HEAD WITHOUT AND WITH CONTRAST TECHNIQUE: Multiplanar, multiecho pulse sequences of the brain and surrounding structures were obtained without and with intravenous contrast. CONTRAST:  96m MULTIHANCE GADOBENATE DIMEGLUMINE 529 MG/ML IV SOLN COMPARISON:  CT head May 11, 2015 at 0013 hours FINDINGS: Homogeneously enhancing bright T2  subcentimeter nodules in the supratentorial brain with surrounding T2 bright FLAIR T2 hyperintense vasogenic edema consistent with metastasis as follows: 3 mm LEFT temporal occipital lobe (axial 23/44 postcontrast). 4 mm RIGHT mesial frontal lobe cortical lesion (axial 29/44 postcontrast). 6 mm round nodule RIGHT posterior frontal lobe cortex (axial 31/44 postcontrast). 2 mm RIGHT frontal and 6 mm RIGHT frontal convexity cortex (axial 37/44 postcontrast). 3 mm RIGHT frontal convexity cortex (axial 40/44 postcontrast. Faint linear enhancing nodule versus vessel within the LEFT frontal cortex/insula (coronal 20/28 postcontrast). No reduced diffusion to suggest acute ischemia or hypercellular tumor. No susceptibility artifact to suggest hemorrhage. Ventricles and sulci are normal for patient's age. Patchy supratentorial white matter FLAIR T2 hyperintensities exclusive of the aforementioned abnormality compatible with moderate chronic small vessel ischemic disease. No midline shift. No abnormal extra-axial fluid collections, dural or leptomeningeal enhancement. No extra-axial masses. Normal major intracranial vascular flow voids seen at the skull base though, dolichoectasia noted which can be seen with chronic hypertension. Ocular globes and orbital contents are normal. Paranasal sinuses and mastoid air cells are well aerated. Convex margin of the pituitary gland without sellar expansion. No cerebellar tonsillar ectopia.  No suspicious calvarial bone marrow signal. IMPRESSION: At least 6 subcentimeter supratentorial predominately cortical based enhancing nodules compatible with nonhemorrhagic metastatic disease. Local edema without significant mass effect. Moderate chronic small vessel ischemic disease without acute ischemia. Electronically Signed   By: Elon Alas M.D.   On: 05/11/2015 04:16    Medications:  Scheduled: . sodium chloride   Intravenous STAT  . cycloSPORINE  1 drop Both Eyes BID  .  dexamethasone  4 mg Intravenous 4 times per day  . fluticasone  1 spray Each Nare Daily  . losartan  50 mg Oral Daily   And  . hydrochlorothiazide  12.5 mg Oral Daily  . levETIRAcetam  500 mg Intravenous Q12H  . montelukast  10 mg Oral Daily  . pravastatin  20 mg Oral Daily   Continuous:  DBZ:[id number]  Assessment/Plan:  Principal Problem:   Aphasia Active Problems:   TOBACCO ABUSE   Essential hypertension   Personal history of malignant neoplasm of breast   Abnormal CT scan, head   Pulmonary nodule, right    Transient aphasia and facial droop/metastatic disease CT head showed hypodense areas in the right frontal lobe and left temporal area. Verdis Frederickson brain reveals multiple subcentimeter supratentorial lesions consistent with metastatic process. Local edema is noted. No mass effect. Facial twitching might have been focal seizure activity. Patient has been initiated on [place]. Neurology is following. Dexamethasone will be initiated as well. CT scan of the chest, abdomen and pelvis to find primary. Once the these tests have been done, hopefully we can identify a lesion that can be biopsied. At that point in time Oncology can be involved.  Pulmonary nodule Patient is a smoker. She has lost weight. Proceed with CT scan of the chest as discussed above.  Weight loss Concerning as she likely does have a malignancy. CT chest, abdomen, pelvis as discussed above.  Microcytic anemia Anemia panel. Stool for occult blood. No overt bleeding identified.  History of essential hypertension Monitor blood pressures closely. Continue with Losartan. Hold HCTZ.  History of hyperlipidemia Continue simvastatin.  History of asthma Stable. Continue home medications  Recent UTI Patient was placed on ciprofloxacin this past Friday. UA does not show any infection. Hold off on continuing for now.  DVT Prophylaxis: SCDs    Code Status: Full code  Family Communication: Discussed with the patient    Disposition Plan: Workup as outlined above.    LOS: 0 days   [facility] Pager [phone] 05/11/2015, 10:56 AM  If 7PM-7AM, please contact night-coverage at [email or web address], password [facility]

## 2015-05-11 NOTE — H&P (Addendum)
[facility] History and Physical  Crystin Lechtenberg LOV:[id number] DOB: 06-13-1950 DOA: 05/10/2015  Referring physician: EDP PCP: PROVIDER NOT IN SYSTEM   Chief Complaint: Aphasia   HPI: Olamae Ferrara is a 65 y.o. female with h/o HTN, HLD, tobacco abuse, BRCA in distant past.  Patient presents to the ED after a 7-10 min episode of difficulty speaking, swallowing, drooling, and facial twitching.  Per EMS patient had left sided facial droop.  No prior h/o CVA.  Patient denies headache, CP, abd pain, cough.  Does have h/o low grade fevers and chills as well as body aches all week (T 100.7 in ED), saw PCP for this and got put on cipro 3 days ago for UTI.  Review of Systems: Systems reviewed.  As above, otherwise negative  Past Medical History  Diagnosis Date  . Hypertension   . Hyperlipidemia   . Asthma   . Asthmatic bronchitis    Past Surgical History  Procedure Laterality Date  . Appendectomy    . Cholecystectomy    . Abdominal hysterectomy     Social History:  reports that she has been smoking.  She does not have any smokeless tobacco history on file. She reports that she does not drink alcohol or use illicit drugs.  No Known Allergies  No family history on file.   Prior to Admission medications   Medication Sig Start Date End Date Taking? Authorizing Provider  albuterol (PROVENTIL HFA;VENTOLIN HFA) 108 (90 BASE) MCG/ACT inhaler Inhale 1-2 puffs into the lungs every 6 (six) hours as needed for wheezing. 09/24/12  Yes Adlih Moreno-Coll, MD  atenolol (TENORMIN) 50 MG tablet Take 50 mg by mouth daily.   Yes Historical Provider, MD  ciprofloxacin (CIPRO) 250 MG tablet Take 250 mg by mouth 2 (two) times daily. 05/07/15  Yes Historical Provider, MD  fluticasone (FLONASE) 50 MCG/ACT nasal spray Place 1 spray into both nostrils daily.  05/05/15  Yes Historical Provider, MD  losartan-hydrochlorothiazide (HYZAAR) 50-12.5 MG tablet Take 1 tablet by mouth daily. 05/10/15  Yes Historical  Provider, MD  montelukast (SINGULAIR) 10 MG tablet Take 10 mg by mouth daily. 05/10/15  Yes Historical Provider, MD  pravastatin (PRAVACHOL) 20 MG tablet Take 20 mg by mouth daily.   Yes Historical Provider, MD  RESTASIS 0.05 % ophthalmic emulsion Place 1 drop into both eyes 2 (two) times daily. 03/12/15  Yes Historical Provider, MD  albuterol (PROVENTIL HFA;VENTOLIN HFA) 108 (90 BASE) MCG/ACT inhaler Inhale 2 puffs into the lungs every 6 (six) hours as needed for wheezing or shortness of breath. Patient not taking: Reported on 05/10/2015 11/05/13   Billy Fischer, MD  azithromycin (ZITHROMAX) 250 MG tablet Take 1 tablet (250 mg total) by mouth daily. Take first 2 tablets together, then 1 every day until finished. Patient not taking: Reported on 05/10/2015 09/24/12   Leonette Monarch Moreno-Coll, MD  benzonatate (TESSALON) 100 MG capsule Take 1 capsule (100 mg total) by mouth every 8 (eight) hours. Patient not taking: Reported on 05/10/2015 09/24/12   Randa Spike, MD  clarithromycin (BIAXIN) 250 MG/5ML suspension Take 10 mLs (500 mg total) by mouth 2 (two) times daily. Days 6-10 Patient not taking: Reported on 05/10/2015 11/22/13   Liam Graham, PA-C  HYDROcodone-acetaminophen (HYCET) 7.5-325 mg/15 ml solution Take 10 mLs by mouth every 6 (six) hours as needed for moderate pain or severe pain. Patient not taking: Reported on 05/10/2015 11/22/13   Liam Graham, PA-C  levofloxacin (LEVAQUIN) 500 MG tablet Take 1 tablet (500 mg  total) by mouth daily. Patient not taking: Reported on 05/10/2015 11/05/13   Billy Fischer, MD  methylPREDNISolone (MEDROL DOSEPAK) 4 MG tablet follow package directions, start on wed, take until finished. Patient not taking: Reported on 05/10/2015 11/05/13   Billy Fischer, MD  metroNIDAZOLE (FLAGYL) 50 mg/ml oral suspension Take 10 mLs (500 mg total) by mouth 2 (two) times daily. Days 6-10 Patient not taking: Reported on 05/10/2015 11/22/13   Liam Graham, PA-C  predniSONE (DELTASONE) 20 MG  tablet 2 tabs by mouth daily for 5 days Patient not taking: Reported on 05/10/2015 09/24/12   Randa Spike, MD   Physical Exam: Filed Vitals:   05/11/15 0115 05/11/15 0130  BP: 116/66 111/61  Pulse: 89 91  Temp:    Resp: 22 26    BP 111/61 mmHg  Pulse 91  Temp(Src) 100.7 F (38.2 C) (Oral)  Resp 26  Ht 5' 1"  (1.549 m)  Wt 54.885 kg (121 lb)  BMI 22.87 kg/m2  SpO2 100%  General Appearance:    Alert, oriented, no distress, appears stated age  Head:    Normocephalic, atraumatic  Eyes:    PERRL, EOMI, sclera non-icteric        Nose:   Nares without drainage or epistaxis. Mucosa, turbinates normal  Throat:   Moist mucous membranes. Oropharynx without erythema or exudate.  Neck:   Supple. No carotid bruits.  No thyromegaly.  No lymphadenopathy.   Back:     No CVA tenderness, no spinal tenderness  Lungs:     Clear to auscultation bilaterally, without wheezes, rhonchi or rales  Chest wall:    No tenderness to palpitation  Heart:    Regular rate and rhythm without murmurs, gallops, rubs  Abdomen:     Soft, non-tender, nondistended, normal bowel sounds, no organomegaly  Genitalia:    deferred  Rectal:    deferred  Extremities:   No clubbing, cyanosis or edema.  Pulses:   2+ and symmetric all extremities  Skin:   Skin color, texture, turgor normal, no rashes or lesions  Lymph nodes:   Cervical, supraclavicular, and axillary nodes normal  Neurologic:   Patient with mild L sided facial weakness    Labs on Admission:  Basic Metabolic Panel:  Recent Labs Lab 05/10/15 2305 05/10/15 2313  NA 135 136  K 4.4 4.3  CL 101 101  CO2 23  --   GLUCOSE 114* 107*  BUN 14 19  CREATININE 0.89 0.80  CALCIUM 9.5  --    Liver Function Tests:  Recent Labs Lab 05/10/15 2305  AST 17  ALT 10*  ALKPHOS 68  BILITOT 0.7  PROT 7.5  ALBUMIN 2.6*   No results for input(s): LIPASE, AMYLASE in the last 168 hours. No results for input(s): AMMONIA in the last 168 hours. CBC:  Recent  Labs Lab 05/10/15 2305 05/10/15 2313  WBC 7.6  --   NEUTROABS 5.0  --   HGB 8.0* 9.2*  HCT 25.2* 27.0*  MCV 77.5*  --   PLT 466*  --    Cardiac Enzymes: No results for input(s): CKTOTAL, CKMB, CKMBINDEX, TROPONINI in the last 168 hours.  BNP (last 3 results) No results for input(s): PROBNP in the last 8760 hours. CBG: No results for input(s): GLUCAP in the last 168 hours.  Radiological Exams on Admission: Dg Chest 2 View  05/11/2015  CLINICAL DATA:  Acute onset of fever and abdominal discomfort. Initial encounter. EXAM: CHEST  2 VIEW COMPARISON:  Chest radiograph  performed 09/24/2012 FINDINGS: A 2.1 cm nodule is noted near the right lung apex. Peribronchial thickening is noted. No pleural effusion or pneumothorax is seen. The heart remains normal in size. No acute osseous abnormalities are seen. Scattered clips are noted at the right axilla. Clips are noted within the right upper quadrant, reflecting prior cholecystectomy. IMPRESSION: 1. 2.1 cm nodule near the right lung apex. CT of the chest would be helpful for further evaluation, when and as deemed clinically appropriate. 2. Peribronchial thickening noted. Electronically Signed   By: Garald Balding M.D.   On: 05/11/2015 00:36   Ct Head Wo Contrast  05/11/2015  CLINICAL DATA:  10 minutes episode of slurred speech today. Onset at 20:30. EXAM: CT HEAD WITHOUT CONTRAST TECHNIQUE: Contiguous axial images were obtained from the base of the skull through the vertex without intravenous contrast. COMPARISON:  None. FINDINGS: There is extensive hypodensity and sulcal effacement in the right frontal lobe, primarily involving the white matter but some gyral involvement is present. A smaller focus of hypodensity is present in the posterior left temporal lobe, with slight sulcal effacement. Patchy periventricular white matter hypodensity is present in the left frontal lobe. These areas of hypodensity may represent subacute multifocal infarction.  However, vasogenic edema associated with cerebral masses is also a consideration. There is no midline shift. Basal cisterns are patent. There is no intracranial hemorrhage. There is no extra-axial fluid collection. No bony abnormality is evident. IMPRESSION: Large area of hypodensity in the right frontal lobe and smaller hypodense focus in the posterior left temporal lobe, both with sulcal effacement but no midline shift. These may represent nonhemorrhagic subacute infarctions, but brain MRI is recommended to exclude cerebral masses. These results were called by telephone at the time of interpretation on 05/11/2015 at 12:44 am to Dr. Leonides Schanz, who verbally acknowledged these results. Electronically Signed   By: Andreas Newport M.D.   On: 05/11/2015 00:51    EKG: Independently reviewed.  Assessment/Plan Principal Problem:   Aphasia Active Problems:   TOBACCO ABUSE   Personal history of malignant neoplasm of breast   Abnormal CT scan, head   Pulmonary nodule, right   1. Aphasia episode with abnormal CT head - 1. CT head shows area of hypodensity in R frontal lobe posteriorly 2. DDx includes subacute ischemic CVA, mass with surrounding vasogenic edema, cerebral abscess.  After review with Dr. Nicole Kindred, he favors that this most likely represents vasogenic edema. 1. Patient does have risk factors for metastatic CA including remote personal history of BRCA in past, she is also a smoker. 3. Regardless will need MRI to definitively diagnose 4. Dr. Nicole Kindred is also concerned about seizures 5. He wants keppra 1gm load and 564m BID ordered, will do so 6. Tele monitor for now 7. No decadron for now per Dr. SNicole Kindreduntil we know results of MRI later this morning. 2. Pulmonary nodule - 2.1 cm on CXR, CT chest is recommended, but Dr. SNicole Kindredsays to hold off for the moment until we see what MRI brain shows, if MRI brain shows mets, then patient will need CT chest/abd/pelvis to look for primary. 3. HTN -  continue home meds    Code Status: Full  Family Communication: No family in room Disposition Plan: Admit to inpatient   Time spent: 740min  GARDNER, JARED M. [facility] Pager [phone] If 7AM-7PM, please contact the day team taking care of the patient [email or web address] Password [email or web address] 05/11/2015, 2:05 AM

## 2015-05-11 NOTE — Care Management Note (Signed)
Case Management Note  Patient Details  Name: Rose [name] MRN: [id number] Date of Birth: 08-11-1950  Subjective/Objective:                    Action/Plan: Patient was admitted with aphasia, facial droop and fever.  Will follow for discharge needs pending patient's progress and physician orders.  Expected Discharge Date:                  Expected Discharge Plan:     In-House Referral:     Discharge planning Services     Post Acute Care Choice:    Choice offered to:     DME Arranged:    DME Agency:     HH Arranged:    HH Agency:     Status of Service:  In process, will continue to follow  Medicare Important Message Given:    Date Medicare IM Given:    Medicare IM give by:    Date Additional Medicare IM Given:    Additional Medicare Important Message give by:     If discussed at [place] of Stay Meetings, dates discussed:    Additional CommentsRolm Baptise, RN 05/11/2015, 10:14 AM [phone]

## 2015-05-11 NOTE — ED Notes (Signed)
Attempted report.  Nurse to call back when available. 

## 2015-05-11 NOTE — Progress Notes (Signed)
Order for Barium Sulfate twice a day d/c since pt had CT at 3pm 4/10.  RN called radiology to clarify order to see if pt needed Barium Sulfate at 2000, radiology stated that the order frequency was put in by mistake and to d/c the order.

## 2015-05-11 NOTE — Consult Note (Signed)
Admission H&P    Chief Complaint: Transient speech and swallowing difficulty with associated left facial twitching.  HPI: Rose [name] is an 65 y.o. female with a history of hypertension, hyperlipidemia and tobacco abuse as well as asthma, presenting to the ED following an episode of transient speech and swallowing difficulty with drooling, and twitching of the left side of her face. Symptoms lasted for about 10 minutes then resolved. She has no previous history of seizure activity. CT scan of her head showed a large area of low density in the right frontal region as well as a small area involving the posterior temporal lobe on the left. Associated effacement of sulci was noted. There was no midline shift. Patient had a low-grade fever on presentation and has had recently diagnosed urinary tract infection and complaints of sinus congestion. She was started on Cipro on 05/07/2015. Chest x-ray in the ED showed 2.1 cm nodule near the right lung apex. MRI of the brain showed at least 6 subcentimeter supratentorial mostly cortically based enhancing nodules compatible with nonhemorrhagic metastatic disease.    Past Medical History  Diagnosis Date  . Hypertension   . Hyperlipidemia   . Asthma   . Asthmatic bronchitis     Past Surgical History  Procedure Laterality Date  . Appendectomy    . Cholecystectomy    . Abdominal hysterectomy      Family history: Reviewed and was noncontributory.  Social History:  reports that she has been smoking.  She does not have any smokeless tobacco history on file. She reports that she does not drink alcohol or use illicit drugs.  Allergies: No Known Allergies  Medications: Patient's preadmission medications were reviewed by me.  ROS: History obtained from chart review and the patient  General ROS: Low-grade fever Psychological ROS: negative for - behavioral disorder, hallucinations, memory difficulties, mood swings or suicidal ideation Ophthalmic ROS:  negative for - blurry vision, double vision, eye pain or loss of vision ENT ROS: Symptoms of sinus congestion Allergy and Immunology ROS: negative for - hives or itchy/watery eyes Hematological and Lymphatic ROS: negative for - bleeding problems, bruising or swollen lymph nodes Endocrine ROS: negative for - galactorrhea, hair pattern changes, polydipsia/polyuria or temperature intolerance Respiratory ROS: Positive for cough Cardiovascular ROS: negative for - chest pain, dyspnea on exertion, edema or irregular heartbeat Gastrointestinal ROS: negative for - abdominal pain, diarrhea, hematemesis, nausea/vomiting or stool incontinence Genito-Urinary ROS: On Cipro for UTI Musculoskeletal ROS: negative for - joint swelling or muscular weakness Neurological ROS: as noted in HPI Dermatological ROS: negative for rash and skin lesion changes  Physical Examination: Blood pressure 111/61, pulse 91, temperature 100.7 F (38.2 C), temperature source Oral, resp. rate 26, height 5\' 1"  (1.549 m), weight 54.885 kg (121 lb), SpO2 100 %.  HEENT-  Normocephalic, no lesions, without obvious abnormality.  Normal external eye and conjunctiva.  Normal TM's bilaterally.  Normal auditory canals and external ears. Normal external nose, mucus membranes and septum.  Normal pharynx. Neck supple with no masses, nodes, nodules or enlargement. Cardiovascular - regular rate and rhythm, S1, S2 normal, no murmur, click, rub or gallop Lungs - chest clear, no wheezing, rales, normal symmetric air entry Abdomen - soft, non-tender; bowel sounds normal; no masses,  no organomegaly Extremities - no joint deformities, effusion, or inflammation and no edema  Neurologic Examination: Mental Status: Alert, oriented, thought content appropriate.  Speech fluent without evidence of aphasia. Able to follow commands without difficulty. Cranial Nerves: II-Visual fields were normal. III/IV/VI-Pupils were  equal and reacted. Extraocular  movements were full and conjugate.    V/VII-no facial numbness; mild left lower facial weakness. VIII-normal. X-normal speech and symmetrical palatal movement. XI: trapezius strength/neck flexion strength normal bilaterally XII-midline tongue extension with normal strength. Motor: 5/5 bilaterally with normal tone and bulk Sensory: Normal throughout. Deep Tendon Reflexes: 2+ and symmetric. Plantars: Flexor bilaterally Cerebellar: Normal finger-to-nose testing. Carotid auscultation: Normal  Results for orders placed or performed during the hospital encounter of 05/10/15 (from the past 48 hour(s))  Protime-INR     Status: None   Collection Time: 05/10/15 11:05 PM  Result Value Ref Range   Prothrombin Time 14.5 11.6 - 15.2 seconds   INR 1.11 0.00 - 1.49  APTT     Status: Abnormal   Collection Time: 05/10/15 11:05 PM  Result Value Ref Range   aPTT 40 (H) 24 - 37 seconds    Comment:        IF BASELINE aPTT IS ELEVATED, SUGGEST PATIENT RISK ASSESSMENT BE USED TO DETERMINE APPROPRIATE ANTICOAGULANT THERAPY.   CBC     Status: Abnormal   Collection Time: 05/10/15 11:05 PM  Result Value Ref Range   WBC 7.6 4.0 - 10.5 K/uL   RBC 3.25 (L) 3.87 - 5.11 MIL/uL   Hemoglobin 8.0 (L) 12.0 - 15.0 g/dL   HCT 25.2 (L) 36.0 - 46.0 %   MCV 77.5 (L) 78.0 - 100.0 fL   MCH 24.6 (L) 26.0 - 34.0 pg   MCHC 31.7 30.0 - 36.0 g/dL   RDW 13.1 11.5 - 15.5 %   Platelets 466 (H) 150 - 400 K/uL  Differential     Status: None   Collection Time: 05/10/15 11:05 PM  Result Value Ref Range   Neutrophils Relative % 64 %   Neutro Abs 5.0 1.7 - 7.7 K/uL   Lymphocytes Relative 25 %   Lymphs Abs 1.9 0.7 - 4.0 K/uL   Monocytes Relative 9 %   Monocytes Absolute 0.7 0.1 - 1.0 K/uL   Eosinophils Relative 1 %   Eosinophils Absolute 0.1 0.0 - 0.7 K/uL   Basophils Relative 1 %   Basophils Absolute 0.0 0.0 - 0.1 K/uL  Comprehensive metabolic panel     Status: Abnormal   Collection Time: 05/10/15 11:05 PM  Result  Value Ref Range   Sodium 135 135 - 145 mmol/L   Potassium 4.4 3.5 - 5.1 mmol/L   Chloride 101 101 - 111 mmol/L   CO2 23 22 - 32 mmol/L   Glucose, Bld 114 (H) 65 - 99 mg/dL   BUN 14 6 - 20 mg/dL   Creatinine, Ser 0.89 0.44 - 1.00 mg/dL   Calcium 9.5 8.9 - 10.3 mg/dL   Total Protein 7.5 6.5 - 8.1 g/dL   Albumin 2.6 (L) 3.5 - 5.0 g/dL   AST 17 15 - 41 U/L   ALT 10 (L) 14 - 54 U/L   Alkaline Phosphatase 68 38 - 126 U/L   Total Bilirubin 0.7 0.3 - 1.2 mg/dL   GFR calc non Af Amer >60 >60 mL/min   GFR calc Af Amer >60 >60 mL/min    Comment: (NOTE) The eGFR has been calculated using the CKD EPI equation. This calculation has not been validated in all clinical situations. eGFR's persistently <60 mL/min signify possible Chronic Kidney Disease.    Anion gap 11 5 - 15  I-Stat Chem 8, ED  (not at [facility], [facility])     Status: Abnormal   Collection Time: 05/10/15 11:13  PM  Result Value Ref Range   Sodium 136 135 - 145 mmol/L   Potassium 4.3 3.5 - 5.1 mmol/L   Chloride 101 101 - 111 mmol/L   BUN 19 6 - 20 mg/dL   Creatinine, Ser 0.80 0.44 - 1.00 mg/dL   Glucose, Bld 107 (H) 65 - 99 mg/dL   Calcium, Ion 1.21 1.13 - 1.30 mmol/L   TCO2 26 0 - 100 mmol/L   Hemoglobin 9.2 (L) 12.0 - 15.0 g/dL   HCT 27.0 (L) 36.0 - 46.0 %  I-stat troponin, ED (not at [facility], [facility])     Status: None   Collection Time: 05/11/15 12:44 AM  Result Value Ref Range   Troponin i, poc 0.00 0.00 - 0.08 ng/mL   Comment 3            Comment: Due to the release kinetics of cTnI, a negative result within the first hours of the onset of symptoms does not rule out myocardial infarction with certainty. If myocardial infarction is still suspected, repeat the test at appropriate intervals.   Urinalysis, Routine w reflex microscopic (not at [facility])     Status: None   Collection Time: 05/11/15 12:53 AM  Result Value Ref Range   Color, Urine YELLOW YELLOW   APPearance CLEAR CLEAR   Specific Gravity, Urine 1.012 1.005 - 1.030   pH  6.0 5.0 - 8.0   Glucose, UA NEGATIVE NEGATIVE mg/dL   Hgb urine dipstick NEGATIVE NEGATIVE   Bilirubin Urine NEGATIVE NEGATIVE   Ketones, ur NEGATIVE NEGATIVE mg/dL   Protein, ur NEGATIVE NEGATIVE mg/dL   Nitrite NEGATIVE NEGATIVE   Leukocytes, UA NEGATIVE NEGATIVE    Comment: MICROSCOPIC NOT DONE ON URINES WITH NEGATIVE PROTEIN, BLOOD, LEUKOCYTES, NITRITE, OR GLUCOSE <1000 mg/dL.  Blood culture (routine x 2)     Status: None (Preliminary result)   Collection Time: 05/11/15  1:05 AM  Result Value Ref Range   Specimen Description BLOOD RIGHT HAND    Special Requests BOTTLES DRAWN AEROBIC AND ANAEROBIC 5CC    Culture PENDING    Report Status PENDING   I-Stat CG4 Lactic Acid, ED     Status: None   Collection Time: 05/11/15  1:11 AM  Result Value Ref Range   Lactic Acid, Venous 0.82 0.5 - 2.0 mmol/L   Dg Chest 2 View  05/11/2015  CLINICAL DATA:  Acute onset of fever and abdominal discomfort. Initial encounter. EXAM: CHEST  2 VIEW COMPARISON:  Chest radiograph performed 09/24/2012 FINDINGS: A 2.1 cm nodule is noted near the right lung apex. Peribronchial thickening is noted. No pleural effusion or pneumothorax is seen. The heart remains normal in size. No acute osseous abnormalities are seen. Scattered clips are noted at the right axilla. Clips are noted within the right upper quadrant, reflecting prior cholecystectomy. IMPRESSION: 1. 2.1 cm nodule near the right lung apex. CT of the chest would be helpful for further evaluation, when and as deemed clinically appropriate. 2. Peribronchial thickening noted. Electronically Signed   By: Garald Balding M.D.   On: 05/11/2015 00:36   Ct Head Wo Contrast  05/11/2015  CLINICAL DATA:  10 minutes episode of slurred speech today. Onset at 20:30. EXAM: CT HEAD WITHOUT CONTRAST TECHNIQUE: Contiguous axial images were obtained from the base of the skull through the vertex without intravenous contrast. COMPARISON:  None. FINDINGS: There is extensive hypodensity  and sulcal effacement in the right frontal lobe, primarily involving the white matter but some gyral involvement is present. A smaller  focus of hypodensity is present in the posterior left temporal lobe, with slight sulcal effacement. Patchy periventricular white matter hypodensity is present in the left frontal lobe. These areas of hypodensity may represent subacute multifocal infarction. However, vasogenic edema associated with cerebral masses is also a consideration. There is no midline shift. Basal cisterns are patent. There is no intracranial hemorrhage. There is no extra-axial fluid collection. No bony abnormality is evident. IMPRESSION: Large area of hypodensity in the right frontal lobe and smaller hypodense focus in the posterior left temporal lobe, both with sulcal effacement but no midline shift. These may represent nonhemorrhagic subacute infarctions, but brain MRI is recommended to exclude cerebral masses. These results were called by telephone at the time of interpretation on 05/11/2015 at 12:44 am to Dr. Leonides Schanz, who verbally acknowledged these results. Electronically Signed   By: Andreas Newport M.D.   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 Primary is unclear at this point, although pulmonary nodule involving the right apex is concerning. Treatment with Cipro for UTI may be a contributing factor to patient's seizure activity, as well.  Recommendations: 1. Keppra 1000 mg IV followed by 500 mg twice a day for seizure management 2. Decadron for cerebral edema 4 mg every 6 hours 3. CT scan of chest abdomen and pelvis with contrast 4. Oncology consult.  We will continue to follow this patient with you.  C.R. Nicole Kindred, MD [facility] [phone]  05/11/2015, 2:04 AM

## 2015-05-12 ENCOUNTER — Ambulatory Visit: Payer: BLUE CROSS/BLUE SHIELD | Admitting: Radiation Oncology

## 2015-05-12 ENCOUNTER — Encounter (HOSPITAL_COMMUNITY): Payer: Self-pay | Admitting: Hematology

## 2015-05-12 ENCOUNTER — Ambulatory Visit
Admission: RE | Admit: 2015-05-12 | Discharge: 2015-05-12 | Disposition: A | Discharge status: Home / Self Care | Payer: BLUE CROSS/BLUE SHIELD | Source: Ambulatory Visit | Attending: Radiation Oncology | Admitting: Radiation Oncology

## 2015-05-12 DIAGNOSIS — C7931 Secondary malignant neoplasm of brain: Principal | ICD-10-CM

## 2015-05-12 DIAGNOSIS — Z510 Encounter for antineoplastic radiation therapy: Secondary | ICD-10-CM | POA: Insufficient documentation

## 2015-05-12 DIAGNOSIS — I10 Essential (primary) hypertension: Secondary | ICD-10-CM

## 2015-05-12 DIAGNOSIS — R911 Solitary pulmonary nodule: Secondary | ICD-10-CM | POA: Diagnosis present

## 2015-05-12 DIAGNOSIS — R4701 Aphasia: Secondary | ICD-10-CM

## 2015-05-12 DIAGNOSIS — R509 Fever, unspecified: Secondary | ICD-10-CM

## 2015-05-12 DIAGNOSIS — R569 Unspecified convulsions: Secondary | ICD-10-CM

## 2015-05-12 DIAGNOSIS — C801 Malignant (primary) neoplasm, unspecified: Secondary | ICD-10-CM

## 2015-05-12 DIAGNOSIS — R590 Localized enlarged lymph nodes: Secondary | ICD-10-CM | POA: Insufficient documentation

## 2015-05-12 DIAGNOSIS — E440 Moderate protein-calorie malnutrition: Secondary | ICD-10-CM | POA: Insufficient documentation

## 2015-05-12 DIAGNOSIS — C3491 Malignant neoplasm of unspecified part of right bronchus or lung: Secondary | ICD-10-CM | POA: Insufficient documentation

## 2015-05-12 DIAGNOSIS — R05 Cough: Secondary | ICD-10-CM

## 2015-05-12 DIAGNOSIS — R253 Fasciculation: Secondary | ICD-10-CM

## 2015-05-12 DIAGNOSIS — G939 Disorder of brain, unspecified: Secondary | ICD-10-CM | POA: Insufficient documentation

## 2015-05-12 DIAGNOSIS — F17210 Nicotine dependence, cigarettes, uncomplicated: Secondary | ICD-10-CM | POA: Insufficient documentation

## 2015-05-12 DIAGNOSIS — D649 Anemia, unspecified: Secondary | ICD-10-CM

## 2015-05-12 LAB — RETICULOCYTES
RBC.: 3.23 MIL/uL — ABNORMAL LOW (ref 3.87–5.11)
RETIC CT PCT: 1 % (ref 0.4–3.1)
Retic Count, Absolute: 32.3 10*3/uL (ref 19.0–186.0)

## 2015-05-12 LAB — CBC
HCT: 25.2 % — ABNORMAL LOW (ref 36.0–46.0)
HEMOGLOBIN: 7.8 g/dL — AB (ref 12.0–15.0)
MCH: 24.1 pg — AB (ref 26.0–34.0)
MCHC: 31 g/dL (ref 30.0–36.0)
MCV: 78 fL (ref 78.0–100.0)
Platelets: 484 10*3/uL — ABNORMAL HIGH (ref 150–400)
RBC: 3.23 MIL/uL — AB (ref 3.87–5.11)
RDW: 13.1 % (ref 11.5–15.5)
WBC: 4.7 10*3/uL (ref 4.0–10.5)

## 2015-05-12 LAB — BASIC METABOLIC PANEL
Anion gap: 11 (ref 5–15)
BUN: 8 mg/dL (ref 6–20)
CHLORIDE: 102 mmol/L (ref 101–111)
CO2: 23 mmol/L (ref 22–32)
CREATININE: 0.65 mg/dL (ref 0.44–1.00)
Calcium: 9.7 mg/dL (ref 8.9–10.3)
GFR calc Af Amer: >60 mL/min (ref >60)
GLUCOSE: 146 mg/dL — AB (ref 65–99)
POTASSIUM: 4.2 mmol/L (ref 3.5–5.1)
Sodium: 136 mmol/L (ref 135–145)

## 2015-05-12 LAB — URINE CULTURE: CULTURE: NO GROWTH

## 2015-05-12 LAB — FOLATE: Folate: 10.3 ng/mL (ref >5.9)

## 2015-05-12 LAB — IRON AND TIBC
IRON: 24 ug/dL — AB (ref 28–170)
Saturation Ratios: 9 % — ABNORMAL LOW (ref 10.4–31.8)
TIBC: 267 ug/dL (ref 250–450)
UIBC: 243 ug/dL

## 2015-05-12 LAB — VITAMIN B12: VITAMIN B 12: 240 pg/mL (ref 180–914)

## 2015-05-12 LAB — FERRITIN: Ferritin: 204 ng/mL (ref 11–307)

## 2015-05-12 MED ORDER — SODIUM CHLORIDE 0.9 % IV SOLN: INTRAVENOUS | Status: DC

## 2015-05-12 MED ORDER — VITAMIN B-12 1000 MCG PO TABS
1000.0000 ug | ORAL_TABLET | Freq: Every day | ORAL | Status: DC
  Administered 2015-05-12 – 2015-05-14 (×3): 1000 ug via ORAL
  Filled 2015-05-12 (×3): qty 1

## 2015-05-12 MED ORDER — ENSURE ENLIVE PO LIQD
237.0000 mL | Freq: Three times a day (TID) | ORAL | Status: DC
  Administered 2015-05-12 – 2015-05-13 (×2): 237 mL via ORAL
  Filled 2015-05-12 (×9): qty 237

## 2015-05-12 MED ORDER — LEVETIRACETAM 500 MG PO TABS
500.0000 mg | ORAL_TABLET | Freq: Two times a day (BID) | ORAL | Status: DC
Start: 2015-05-12 — End: 2015-05-14
  Administered 2015-05-12 – 2015-05-14 (×5): 500 mg via ORAL
  Filled 2015-05-12 (×5): qty 1

## 2015-05-12 NOTE — Consult Note (Signed)
Name: Rose [name] MRN: [id number] DOB: Jun 02, 1950    ADMISSION DATE:  05/10/2015 CONSULTATION DATE:  05/12/2015  REFERRING MD :  [place] Pink  CHIEF COMPLAINT:  Brain lesions  BRIEF PATIENT DESCRIPTION: 65 -year-old female with remote history of breast cancer status post lumpectomy in 1992. Found have several supratentorial lesions after presenting with dysphasia and facial twitching. Mediastinal lymphadenopathy also noted, pulmonary consulted for potential bronchoscopic biopsy.  SIGNIFICANT EVENTS    STUDIES:  CT head 4/10 > Large area of hypodensity in the right frontal lobe and smaller hypodense focus in the posterior left temporal lobe, both with sulcal effacement but no midline shift. These may represent nonhemorrhagic subacute infarctions, but brain MRI is recommended to exclude cerebral masses. MRI Brain 4/10 > At least 6 subcentimeter supratentorial predominately cortical based enhancing nodules compatible with nonhemorrhagic metastatic disease. Local edema without significant mass effect. CT abd/pel/chest 4/10 > Right upper lobe nodule with right hilar, bilateral mediastinal, and thoracic inlet adenopathy. 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 History of breast cancer with right axillary dissection. Left nephrectomy for uncertain indication.   HISTORY OF PRESENT ILLNESS:  65 year old female past medical history as below, which includes breast cancer status post lumpectomy in 1992, hypertension, asthmatic bronchitis, seasonal allergies. She is a current every day smoker and smokes about 5-7 cigarettes per day. She has been smoking for upwards of 30 years. She denies any alcohol or illegal substance abuse. She was recently treated with antibiotics for UTI after noting that she would awaken from sleep several times a night to void her bladder. Besides UTI, she was in her usual state of health until 4/9 late p.m. when she developed some facial  twitching on the right side and difficulty speaking. She thought she was having a stroke and attempted to chew aspirin she called 911. Upon the time they had arrived her symptoms have resolved. She presented to the emergency department and underwent CT scan of the head which prompted MRI of the brain, which demonstrated at least 6 subcentimeter supratentorial enhancing nodules concerning for metastatic disease. CT scan of the abdomen and pelvis and chest were performed, and demonstrated lymphadenopathy. Pulmonary consulted for possible bronchoscopic biopsy.  PAST MEDICAL HISTORY :   has a past medical history of Hypertension; Hyperlipidemia; Family history of adverse reaction to anesthesia; Asthmatic bronchitis; Chronic bronchitis ([place]); Sinus headache; and Seizures ([place]) (05/10/2015).  has past surgical history that includes Appendectomy; Abdominal hysterectomy; Laparoscopic cholecystectomy; Breast biopsy (Right); and Breast lumpectomy (Right). Prior to Admission medications   Medication Sig Start Date End Date Taking? Authorizing Provider  albuterol (PROVENTIL HFA;VENTOLIN HFA) 108 (90 BASE) MCG/ACT inhaler Inhale 1-2 puffs into the lungs every 6 (six) hours as needed for wheezing. 09/24/12  Yes Adlih Moreno-Coll, MD  atenolol (TENORMIN) 50 MG tablet Take 50 mg by mouth daily.   Yes Historical Provider, MD  ciprofloxacin (CIPRO) 250 MG tablet Take 250 mg by mouth 2 (two) times daily. 05/07/15  Yes Historical Provider, MD  fluticasone (FLONASE) 50 MCG/ACT nasal spray Place 1 spray into both nostrils daily.  05/05/15  Yes Historical Provider, MD  losartan-hydrochlorothiazide (HYZAAR) 50-12.5 MG tablet Take 1 tablet by mouth daily. 05/10/15  Yes Historical Provider, MD  montelukast (SINGULAIR) 10 MG tablet Take 10 mg by mouth daily. 05/10/15  Yes Historical Provider, MD  pravastatin (PRAVACHOL) 20 MG tablet Take 20 mg by mouth daily.   Yes Historical Provider, MD  RESTASIS 0.05 % ophthalmic emulsion Place 1 drop  into  both eyes 2 (two) times daily. 03/12/15  Yes Historical Provider, MD   No Known Allergies  FAMILY HISTORY:  family history is not on file. SOCIAL HISTORY:  reports that she has been smoking Cigarettes.  She has a 18 pack-year smoking history. She has never used smokeless tobacco. She reports that she does not drink alcohol or use illicit drugs.  REVIEW OF SYSTEMS:   Constitutional: Negative for fever, chills, weight loss, malaise/fatigue and diaphoresis.  HENT: Negative for hearing loss, ear pain, nosebleeds, congestion, sore throat, neck pain, tinnitus and ear discharge.   Eyes: Negative for blurred vision, double vision, photophobia, pain, discharge and redness.  Respiratory: Negative for cough, hemoptysis, sputum production, shortness of breath, wheezing and stridor.   Cardiovascular: Negative for chest pain, palpitations, orthopnea, claudication, leg swelling and PND.  Gastrointestinal: Negative for heartburn, nausea, vomiting, abdominal pain, diarrhea, constipation, blood in stool and melena.  Genitourinary: Negative for dysuria, urgency, frequency, hematuria and flank pain.  Musculoskeletal: Negative for myalgias, back pain, joint pain and falls.  Skin: Negative for itching and rash.  Neurological: Negative for dizziness, tingling, tremors, sensory change, speech change, focal weakness, seizures, loss of consciousness, weakness and headaches.  Endo/Heme/Allergies: Negative for environmental allergies and polydipsia. Does not bruise/bleed easily.  SUBJECTIVE:   VITAL SIGNS: Temp:  [98.1 F (36.7 C)-98.9 F (37.2 C)] 98.9 F (37.2 C) (04/11 1026) Pulse Rate:  [81-90] 90 (04/11 1026) Resp:  [20] 20 (04/11 1026) BP: (101-118)/(45-58) 118/54 mmHg (04/11 1026) SpO2:  [98 %-100 %] 100 % (04/11 1026)  PHYSICAL EXAMINATION: General:  Female of normal body habitus in NAD Neuro:  Alert, oriented, nonfocal HEENT:  [place]/AT, PERRL, no JVD Cardiovascular:  RRR, no MRG Lungs:   Intermittent faint expiratory wheeze Abdomen:  Soft, non-tender, non-distended Musculoskeletal:  No acute deformity or ROM limitation Skin:  Grossly intact   Recent Labs Lab 05/10/15 2305 05/10/15 2313 05/12/15 0334  NA 135 136 136  K 4.4 4.3 4.2  CL 101 101 102  CO2 23  --  23  BUN '14 19 8  '$ CREATININE 0.89 0.80 0.65  GLUCOSE 114* 107* 146*    Recent Labs Lab 05/10/15 2305 05/10/15 2313 05/12/15 0334  HGB 8.0* 9.2* 7.8*  HCT 25.2* 27.0* 25.2*  WBC 7.6  --  4.7  PLT 466*  --  484*   Dg Chest 2 View  05/11/2015  CLINICAL DATA:  Acute onset of fever and abdominal discomfort. Initial encounter. EXAM: CHEST  2 VIEW COMPARISON:  Chest radiograph performed 09/24/2012 FINDINGS: A 2.1 cm nodule is noted near the right lung apex. Peribronchial thickening is noted. No pleural effusion or pneumothorax is seen. The heart remains normal in size. No acute osseous abnormalities are seen. Scattered clips are noted at the right axilla. Clips are noted within the right upper quadrant, reflecting prior cholecystectomy. IMPRESSION: 1. 2.1 cm nodule near the right lung apex. CT of the chest would be helpful for further evaluation, when and as deemed clinically appropriate. 2. Peribronchial thickening noted. Electronically Signed   By: Garald Balding M.D.   On: 05/11/2015 00:36   Ct Head Wo Contrast  05/11/2015  CLINICAL DATA:  10 minutes episode of slurred speech today. Onset at 20:30. EXAM: CT HEAD WITHOUT CONTRAST TECHNIQUE: Contiguous axial images were obtained from the base of the skull through the vertex without intravenous contrast. COMPARISON:  None. FINDINGS: There is extensive hypodensity and sulcal effacement in the right frontal lobe, primarily involving the white matter but some gyral  involvement is present. A smaller focus of hypodensity is present in the posterior left temporal lobe, with slight sulcal effacement. Patchy periventricular white matter hypodensity is present in the left  frontal lobe. These areas of hypodensity may represent subacute multifocal infarction. However, vasogenic edema associated with cerebral masses is also a consideration. There is no midline shift. Basal cisterns are patent. There is no intracranial hemorrhage. There is no extra-axial fluid collection. No bony abnormality is evident. IMPRESSION: Large area of hypodensity in the right frontal lobe and smaller hypodense focus in the posterior left temporal lobe, both with sulcal effacement but no midline shift. These may represent nonhemorrhagic subacute infarctions, but brain MRI is recommended to exclude cerebral masses. These results were called by telephone at the time of interpretation on 05/11/2015 at 12:44 am to Dr. Leonides Schanz, who verbally acknowledged these results. Electronically Signed   By: Andreas Newport M.D.   On: 05/11/2015 00:51   Ct Chest W Contrast  05/11/2015  CLINICAL DATA:  Suspected brain metastases.  Pulmonary nodule. EXAM: CT CHEST, ABDOMEN, AND PELVIS WITH CONTRAST TECHNIQUE: Multidetector CT imaging of the chest, abdomen and pelvis was performed following the standard protocol during bolus administration of intravenous contrast. CONTRAST:  1 ISOVUE-300 IOPAMIDOL (ISOVUE-300) INJECTION 61% COMPARISON:  None. FINDINGS: CT CHEST THORACIC INLET/BODY WALL: Heterogeneously enhancing right thoracic node measuring 17 mm. Changes of right axillary dissection for breast cancer. No axillary adenopathy. MEDIASTINUM: Right bronchial, hilar, and bilateral mediastinal adenopathy (bilateral paratracheal). The right paratracheal node is heterogeneously enhancing and measures 27 mm in short axis. Normal heart size. No pericardial effusion. Atherosclerotic calcification, including at the coronary arteries. LUNG WINDOWS: Right upper lobe peripheral pulmonary nodule measuring up to 23 mm. This is likely a primary bronchogenic carcinoma. Background emphysema. No definitive pulmonary metastasis. A 2 mm left lower lobe  pulmonary nodule 3:33 will be seen on surveillance imaging. OSSEOUS: No suspicious lytic or blastic lesions. CT ABDOMEN AND PELVIS Abdominal wall:  No contributory findings. Hepatobiliary: No evidence of hepatic metastasis.Cholecystectomy. Pancreas: Unremarkable. Spleen:  Granulomatous changes. Adrenals/Urinary Tract: Only the right adrenal gland is seen/remains. No evidence of metastasis. Left nephrectomy with compensatory hypertrophy of the right kidney. Simple appearing right renal cyst. Unremarkable bladder. Reproductive:Hysterectomy and possible left oophorectomy. Negative right adnexa. Stomach/Bowel:  No obstruction. No appendicitis. Vascular/Lymphatic: No acute vascular abnormality. No mass or adenopathy. Peritoneal: No ascites or pneumoperitoneum. Musculoskeletal: No acute abnormalities. IMPRESSION: 1. Right upper lobe nodule with right hilar, bilateral mediastinal, and thoracic inlet adenopathy. 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 2. History of breast cancer with right axillary dissection. 3. Left nephrectomy for uncertain indication. Electronically Signed   By: Monte Fantasia M.D.   On: 05/11/2015 15:29   Mr Jeri Cos PI Contrast  05/11/2015  CLINICAL DATA:  Brief episode of difficulty speaking, facial twitching, drooling LEFT facial droop. Low-grade fever and body aches this week. On treatment for urinary tract infection. Assess for med passed a cyst. EXAM: MRI HEAD WITHOUT AND WITH CONTRAST TECHNIQUE: Multiplanar, multiecho pulse sequences of the brain and surrounding structures were obtained without and with intravenous contrast. CONTRAST:  44m MULTIHANCE GADOBENATE DIMEGLUMINE 529 MG/ML IV SOLN COMPARISON:  CT head May 11, 2015 at 0013 hours FINDINGS: Homogeneously enhancing bright T2 subcentimeter nodules in the supratentorial brain with surrounding T2 bright FLAIR T2 hyperintense vasogenic edema consistent with metastasis as follows: 3 mm LEFT temporal  occipital lobe (axial 23/44 postcontrast). 4 mm RIGHT mesial frontal lobe cortical lesion (axial 29/44 postcontrast).  6 mm round nodule RIGHT posterior frontal lobe cortex (axial 31/44 postcontrast). 2 mm RIGHT frontal and 6 mm RIGHT frontal convexity cortex (axial 37/44 postcontrast). 3 mm RIGHT frontal convexity cortex (axial 40/44 postcontrast. Faint linear enhancing nodule versus vessel within the LEFT frontal cortex/insula (coronal 20/28 postcontrast). No reduced diffusion to suggest acute ischemia or hypercellular tumor. No susceptibility artifact to suggest hemorrhage. Ventricles and sulci are normal for patient's age. Patchy supratentorial white matter FLAIR T2 hyperintensities exclusive of the aforementioned abnormality compatible with moderate chronic small vessel ischemic disease. No midline shift. No abnormal extra-axial fluid collections, dural or leptomeningeal enhancement. No extra-axial masses. Normal major intracranial vascular flow voids seen at the skull base though, dolichoectasia noted which can be seen with chronic hypertension. Ocular globes and orbital contents are normal. Paranasal sinuses and mastoid air cells are well aerated. Convex margin of the pituitary gland without sellar expansion. No cerebellar tonsillar ectopia. No suspicious calvarial bone marrow signal. IMPRESSION: At least 6 subcentimeter supratentorial predominately cortical based enhancing nodules compatible with nonhemorrhagic metastatic disease. Local edema without significant mass effect. Moderate chronic small vessel ischemic disease without acute ischemia. Electronically Signed   By: Elon Alas M.D.   On: 05/11/2015 04:16   Ct Abdomen Pelvis W Contrast  05/11/2015  CLINICAL DATA:  Suspected brain metastases.  Pulmonary nodule. EXAM: CT CHEST, ABDOMEN, AND PELVIS WITH CONTRAST TECHNIQUE: Multidetector CT imaging of the chest, abdomen and pelvis was performed following the standard protocol during bolus  administration of intravenous contrast. CONTRAST:  1 ISOVUE-300 IOPAMIDOL (ISOVUE-300) INJECTION 61% COMPARISON:  None. FINDINGS: CT CHEST THORACIC INLET/BODY WALL: Heterogeneously enhancing right thoracic node measuring 17 mm. Changes of right axillary dissection for breast cancer. No axillary adenopathy. MEDIASTINUM: Right bronchial, hilar, and bilateral mediastinal adenopathy (bilateral paratracheal). The right paratracheal node is heterogeneously enhancing and measures 27 mm in short axis. Normal heart size. No pericardial effusion. Atherosclerotic calcification, including at the coronary arteries. LUNG WINDOWS: Right upper lobe peripheral pulmonary nodule measuring up to 23 mm. This is likely a primary bronchogenic carcinoma. Background emphysema. No definitive pulmonary metastasis. A 2 mm left lower lobe pulmonary nodule 3:33 will be seen on surveillance imaging. OSSEOUS: No suspicious lytic or blastic lesions. CT ABDOMEN AND PELVIS Abdominal wall:  No contributory findings. Hepatobiliary: No evidence of hepatic metastasis.Cholecystectomy. Pancreas: Unremarkable. Spleen:  Granulomatous changes. Adrenals/Urinary Tract: Only the right adrenal gland is seen/remains. No evidence of metastasis. Left nephrectomy with compensatory hypertrophy of the right kidney. Simple appearing right renal cyst. Unremarkable bladder. Reproductive:Hysterectomy and possible left oophorectomy. Negative right adnexa. Stomach/Bowel:  No obstruction. No appendicitis. Vascular/Lymphatic: No acute vascular abnormality. No mass or adenopathy. Peritoneal: No ascites or pneumoperitoneum. Musculoskeletal: No acute abnormalities. IMPRESSION: 1. Right upper lobe nodule with right hilar, bilateral mediastinal, and thoracic inlet adenopathy. 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 2. History of breast cancer with right axillary dissection. 3. Left nephrectomy for uncertain indication. Electronically Signed    By: Monte Fantasia M.D.   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 Lamonte Sakai, will again defer to attending physician - Management per [facility], Neurology  Asthmatic Bronchitis - Continue PRN albuterol HFA - fluticasone nasal spray  Pulmonary and Critical Care  [place] Pager: [phone]  05/12/2015, 12:05 PM  STAFF NOTE: I, Merrie Roof, MD FACP have personally reviewed patient's available data, including medical history, events of note, physical examination and test results as part of my evaluation. I have discussed with resident/NP and other care providers such as pharmacist, RN and RRT. In addition, I personally evaluated patient and elicited key findings of: no distress, resolved faint wheezing bilateral, good spirits, heart sounds wnl, all scans reviewed, CT and MRI, need Tx bx, appears that IR bx would result in ptx high risk given normal lung tissue peripheral to lesion, not too impressed for nodes hilar and may yiled low for ebus, would consider navigational Bx with Dr Lamonte Sakai review pending given location, could also do some ebus bx for additional tissue samples, will obtain super D images, repeat PTT, updated al risks  / benefit pt   Lavon Paganini. Titus Mould, MD, [place] Pgr: [facility] 05/12/2015 2:27 PM

## 2015-05-12 NOTE — Progress Notes (Signed)
Subjective: No further seizures since admission.   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 She is now seizure free on keppra.   Recommendations: 1) Continue keppra '500mg'$  BID 2) Agree with radiation oncology consultation 3) will need outpatient neurology follow up for seizures.  4) Neuro to sign off, please call with any further questions or concerns.   Roland Rack, MD [facility] [phone]  If 7pm- 7am, please page neurology on call as listed in [place].

## 2015-05-12 NOTE — Progress Notes (Signed)
Initial Nutrition Assessment  DOCUMENTATION CODES:   Non-severe (moderate) malnutrition in context of chronic illness  INTERVENTION:  - Ensure Enlive TID. Each supplement provides 350 kcals and 20 grams of protein.  - Continue to monitor for nutritional needs.   NUTRITION DIAGNOSIS:   Inadequate oral intake related to poor appetite as evidenced by per patient/family report.  GOAL:   Patient will meet greater than or equal to 90% of their needs  MONITOR:   PO intake, Supplement acceptance, Labs, Weight trends, Skin, I & O's  REASON FOR ASSESSMENT:   Malnutrition Screening Tool    ASSESSMENT:   65 year old African-American female with a past medical history of hypertension, hyperlipidemia, remote breast cancer (1992) status post lumpectomy who was being treated for a urinary tract infection recently with ciprofloxacin, who presented with the 7-10 minute episode of difficulty speaking, facial twitching. Patient had an abnormal CT scan of the head. She was admitted for further evaluation. Patient found to have several supratentorial lesions. She was found to have findings concerning for lung cancer.  Pt seen for MST. Pt BMI categorized as healthy weight. Pt reports UBW of 140 lbs until about 6 months ago. Pt currently weighs 121 lbs. Pt has experienced a 13% weight loss in the past 6 months which is significant for time frame. Per chart, pt may be experiencing weight loss d/t recent findings that indicate cancer.   Pt reports eating BID. Pt provided diet recall. Pt eats coffee and a sandwich at one meal and coffee and a bowl of cereal at second meal. Pt reports poor appetite. Pt denies N/V and abdominal pain associated with eating. Pt does not use nutritional supplements at home. Intern emphasized the importance of adequate protein and kcals to meet needs and prevent further weight loss. Pt received information with eagerness. Pt has tried the chocolate Ensure and likes it. Pt wants to  receive Ensure. Will order Ensure TID.   NFPE: Mild muscle depletion, mild fat depletion, no edema.   Labs reviewed; glucose 146 mg/dl.  Meds reviewed; B12 1000 mcg.   Diet Order:  Diet Heart Room service appropriate?: Yes; Fluid consistency:: Thin  Skin:  Reviewed, no issues  Last BM:  4/9  Height:   Ht Readings from Last 1 Encounters:  05/10/15 '5\' 1"'$  (1.549 m)    Weight:   Wt Readings from Last 1 Encounters:  05/10/15 121 lb (54.885 kg)    Ideal Body Weight:  47.7 kg  BMI:  Body mass index is 22.87 kg/(m^2).  Estimated Nutritional Needs:   Kcal:  1600-1800 (29-32 kcal/kg)  Protein:  80-90 g (1.4-1.5 g/kg)  Fluid:  1.6-1.8 L  EDUCATION NEEDS:   No education needs identified at this time  Geoffery Lyons, [place] Dietetic Intern Pager [phone]

## 2015-05-12 NOTE — Progress Notes (Addendum)
[facility] PROGRESS NOTE  Sonny Poth OZD:[id number] DOB: 1950-06-28 DOA: 05/10/2015  PCP: Rogers Blocker, MD  Brief HPI: 65 year old African-American female with a past medical history of hypertension, hyperlipidemia, remote breast cancer (1992) status post lumpectomy who was being treated for a urinary tract infection recently with ciprofloxacin, who presented with the 7-10 minute episode of difficulty speaking, facial twitching. Patient had an abnormal CT scan of the head. She was admitted for further evaluation. Patient found to have several supratentorial lesions. She was found to have findings concerning for lung cancer.  Past medical history:  Past Medical History  Diagnosis Date  . Hypertension   . Hyperlipidemia   . Family history of adverse reaction to anesthesia     "my son passed away I think related to the anesthesia"  . Asthmatic bronchitis   . Chronic bronchitis ([place])   . Sinus headache   . Seizures ([place]) 05/10/2015    "it may have been related to me taking Cipro" (05/11/2015)    Consultants: Neurology. Pulmonology. Radiation oncology. Medical oncology  Procedures:  None  Antibiotics: None  Subjective: Patient feels well today. Denies any headaches. She is quite upset when I was discussing the CT chest and MRI brain findings. Denies any shortness of breath or chest pains currently. Denies any weakness in her arms or legs. Reports weight loss of 10 pounds over the last 6 months. She denies any blood in the stool or black colored stool. She has never had a colonoscopy.  Objective: Vital Signs  Filed Vitals:   05/11/15 1847 05/11/15 2101 05/12/15 0209 05/12/15 0642  BP: 114/58 104/47 110/54 101/45  Pulse: 89 87 88 81  Temp: 98.9 F (37.2 C) 98.8 F (37.1 C) 98.1 F (36.7 C) 98.4 F (36.9 C)  TempSrc: Oral Oral Oral Oral  Resp: '20 20 20 20  '$ Height:      Weight:      SpO2: 99% 98% 100% 100%   No intake or output data in the 24 hours ending 05/12/15  0829 Filed Weights   05/10/15 2227  Weight: 54.885 kg (121 lb)    General appearance: alert, cooperative, appears stated age and no distress Resp: clear to auscultation bilaterally Cardio: regular rate and rhythm, S1, S2 normal, no murmur, click, rub or gallop GI: Abdomen is soft. Nontender today. No masses or organomegaly. Bowel sounds are present. Extremities: extremities normal, atraumatic, no cyanosis or edema Neurologic: Awake and alert. No obvious facial asymmetry noted at this time. Motor strength equal bilateral upper and lower extremities.  Lab Results:  Basic Metabolic Panel:  Recent Labs Lab 05/10/15 2305 05/10/15 2313 05/12/15 0334  NA 135 136 136  K 4.4 4.3 4.2  CL 101 101 102  CO2 23  --  23  GLUCOSE 114* 107* 146*  BUN '14 19 8  '$ CREATININE 0.89 0.80 0.65  CALCIUM 9.5  --  9.7   Liver Function Tests:  Recent Labs Lab 05/10/15 2305  AST 17  ALT 10*  ALKPHOS 68  BILITOT 0.7  PROT 7.5  ALBUMIN 2.6*   CBC:  Recent Labs Lab 05/10/15 2305 05/10/15 2313 05/12/15 0334  WBC 7.6  --  4.7  NEUTROABS 5.0  --   --   HGB 8.0* 9.2* 7.8*  HCT 25.2* 27.0* 25.2*  MCV 77.5*  --  78.0  PLT 466*  --  484*    Recent Results (from the past 240 hour(s))  Blood culture (routine x 2)     Status:  None (Preliminary result)   Collection Time: 05/11/15  1:05 AM  Result Value Ref Range Status   Specimen Description BLOOD RIGHT HAND  Final   Special Requests BOTTLES DRAWN AEROBIC AND ANAEROBIC 5CC  Final   Culture PENDING  Incomplete   Report Status PENDING  Incomplete      Studies/Results: Dg Chest 2 View  05/11/2015  CLINICAL DATA:  Acute onset of fever and abdominal discomfort. Initial encounter. EXAM: CHEST  2 VIEW COMPARISON:  Chest radiograph performed 09/24/2012 FINDINGS: A 2.1 cm nodule is noted near the right lung apex. Peribronchial thickening is noted. No pleural effusion or pneumothorax is seen. The heart remains normal in size. No acute osseous  abnormalities are seen. Scattered clips are noted at the right axilla. Clips are noted within the right upper quadrant, reflecting prior cholecystectomy. IMPRESSION: 1. 2.1 cm nodule near the right lung apex. CT of the chest would be helpful for further evaluation, when and as deemed clinically appropriate. 2. Peribronchial thickening noted. Electronically Signed   By: Garald Balding M.D.   On: 05/11/2015 00:36   Ct Head Wo Contrast  05/11/2015  CLINICAL DATA:  10 minutes episode of slurred speech today. Onset at 20:30. EXAM: CT HEAD WITHOUT CONTRAST TECHNIQUE: Contiguous axial images were obtained from the base of the skull through the vertex without intravenous contrast. COMPARISON:  None. FINDINGS: There is extensive hypodensity and sulcal effacement in the right frontal lobe, primarily involving the white matter but some gyral involvement is present. A smaller focus of hypodensity is present in the posterior left temporal lobe, with slight sulcal effacement. Patchy periventricular white matter hypodensity is present in the left frontal lobe. These areas of hypodensity may represent subacute multifocal infarction. However, vasogenic edema associated with cerebral masses is also a consideration. There is no midline shift. Basal cisterns are patent. There is no intracranial hemorrhage. There is no extra-axial fluid collection. No bony abnormality is evident. IMPRESSION: Large area of hypodensity in the right frontal lobe and smaller hypodense focus in the posterior left temporal lobe, both with sulcal effacement but no midline shift. These may represent nonhemorrhagic subacute infarctions, but brain MRI is recommended to exclude cerebral masses. These results were called by telephone at the time of interpretation on 05/11/2015 at 12:44 am to Dr. Leonides Schanz, who verbally acknowledged these results. Electronically Signed   By: Andreas Newport M.D.   On: 05/11/2015 00:51   Ct Chest W Contrast  05/11/2015  CLINICAL  DATA:  Suspected brain metastases.  Pulmonary nodule. EXAM: CT CHEST, ABDOMEN, AND PELVIS WITH CONTRAST TECHNIQUE: Multidetector CT imaging of the chest, abdomen and pelvis was performed following the standard protocol during bolus administration of intravenous contrast. CONTRAST:  1 ISOVUE-300 IOPAMIDOL (ISOVUE-300) INJECTION 61% COMPARISON:  None. FINDINGS: CT CHEST THORACIC INLET/BODY WALL: Heterogeneously enhancing right thoracic node measuring 17 mm. Changes of right axillary dissection for breast cancer. No axillary adenopathy. MEDIASTINUM: Right bronchial, hilar, and bilateral mediastinal adenopathy (bilateral paratracheal). The right paratracheal node is heterogeneously enhancing and measures 27 mm in short axis. Normal heart size. No pericardial effusion. Atherosclerotic calcification, including at the coronary arteries. LUNG WINDOWS: Right upper lobe peripheral pulmonary nodule measuring up to 23 mm. This is likely a primary bronchogenic carcinoma. Background emphysema. No definitive pulmonary metastasis. A 2 mm left lower lobe pulmonary nodule 3:33 will be seen on surveillance imaging. OSSEOUS: No suspicious lytic or blastic lesions. CT ABDOMEN AND PELVIS Abdominal wall:  No contributory findings. Hepatobiliary: No evidence of hepatic metastasis.Cholecystectomy. Pancreas:  Unremarkable. Spleen:  Granulomatous changes. Adrenals/Urinary Tract: Only the right adrenal gland is seen/remains. No evidence of metastasis. Left nephrectomy with compensatory hypertrophy of the right kidney. Simple appearing right renal cyst. Unremarkable bladder. Reproductive:Hysterectomy and possible left oophorectomy. Negative right adnexa. Stomach/Bowel:  No obstruction. No appendicitis. Vascular/Lymphatic: No acute vascular abnormality. No mass or adenopathy. Peritoneal: No ascites or pneumoperitoneum. Musculoskeletal: No acute abnormalities. IMPRESSION: 1. Right upper lobe nodule with right hilar, bilateral mediastinal, and  thoracic inlet adenopathy. 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 2. History of breast cancer with right axillary dissection. 3. Left nephrectomy for uncertain indication. Electronically Signed   By: Monte Fantasia M.D.   On: 05/11/2015 15:29   Mr Jeri Cos IW Contrast  05/11/2015  CLINICAL DATA:  Brief episode of difficulty speaking, facial twitching, drooling LEFT facial droop. Low-grade fever and body aches this week. On treatment for urinary tract infection. Assess for med passed a cyst. EXAM: MRI HEAD WITHOUT AND WITH CONTRAST TECHNIQUE: Multiplanar, multiecho pulse sequences of the brain and surrounding structures were obtained without and with intravenous contrast. CONTRAST:  41m MULTIHANCE GADOBENATE DIMEGLUMINE 529 MG/ML IV SOLN COMPARISON:  CT head May 11, 2015 at 0013 hours FINDINGS: Homogeneously enhancing bright T2 subcentimeter nodules in the supratentorial brain with surrounding T2 bright FLAIR T2 hyperintense vasogenic edema consistent with metastasis as follows: 3 mm LEFT temporal occipital lobe (axial 23/44 postcontrast). 4 mm RIGHT mesial frontal lobe cortical lesion (axial 29/44 postcontrast). 6 mm round nodule RIGHT posterior frontal lobe cortex (axial 31/44 postcontrast). 2 mm RIGHT frontal and 6 mm RIGHT frontal convexity cortex (axial 37/44 postcontrast). 3 mm RIGHT frontal convexity cortex (axial 40/44 postcontrast. Faint linear enhancing nodule versus vessel within the LEFT frontal cortex/insula (coronal 20/28 postcontrast). No reduced diffusion to suggest acute ischemia or hypercellular tumor. No susceptibility artifact to suggest hemorrhage. Ventricles and sulci are normal for patient's age. Patchy supratentorial white matter FLAIR T2 hyperintensities exclusive of the aforementioned abnormality compatible with moderate chronic small vessel ischemic disease. No midline shift. No abnormal extra-axial fluid collections, dural or leptomeningeal  enhancement. No extra-axial masses. Normal major intracranial vascular flow voids seen at the skull base though, dolichoectasia noted which can be seen with chronic hypertension. Ocular globes and orbital contents are normal. Paranasal sinuses and mastoid air cells are well aerated. Convex margin of the pituitary gland without sellar expansion. No cerebellar tonsillar ectopia. No suspicious calvarial bone marrow signal. IMPRESSION: At least 6 subcentimeter supratentorial predominately cortical based enhancing nodules compatible with nonhemorrhagic metastatic disease. Local edema without significant mass effect. Moderate chronic small vessel ischemic disease without acute ischemia. Electronically Signed   By: CElon AlasM.D.   On: 05/11/2015 04:16   Ct Abdomen Pelvis W Contrast  05/11/2015  CLINICAL DATA:  Suspected brain metastases.  Pulmonary nodule. EXAM: CT CHEST, ABDOMEN, AND PELVIS WITH CONTRAST TECHNIQUE: Multidetector CT imaging of the chest, abdomen and pelvis was performed following the standard protocol during bolus administration of intravenous contrast. CONTRAST:  1 ISOVUE-300 IOPAMIDOL (ISOVUE-300) INJECTION 61% COMPARISON:  None. FINDINGS: CT CHEST THORACIC INLET/BODY WALL: Heterogeneously enhancing right thoracic node measuring 17 mm. Changes of right axillary dissection for breast cancer. No axillary adenopathy. MEDIASTINUM: Right bronchial, hilar, and bilateral mediastinal adenopathy (bilateral paratracheal). The right paratracheal node is heterogeneously enhancing and measures 27 mm in short axis. Normal heart size. No pericardial effusion. Atherosclerotic calcification, including at the coronary arteries. LUNG WINDOWS: Right upper lobe peripheral pulmonary nodule measuring up to 23 mm. This  is likely a primary bronchogenic carcinoma. Background emphysema. No definitive pulmonary metastasis. A 2 mm left lower lobe pulmonary nodule 3:33 will be seen on surveillance imaging. OSSEOUS: No  suspicious lytic or blastic lesions. CT ABDOMEN AND PELVIS Abdominal wall:  No contributory findings. Hepatobiliary: No evidence of hepatic metastasis.Cholecystectomy. Pancreas: Unremarkable. Spleen:  Granulomatous changes. Adrenals/Urinary Tract: Only the right adrenal gland is seen/remains. No evidence of metastasis. Left nephrectomy with compensatory hypertrophy of the right kidney. Simple appearing right renal cyst. Unremarkable bladder. Reproductive:Hysterectomy and possible left oophorectomy. Negative right adnexa. Stomach/Bowel:  No obstruction. No appendicitis. Vascular/Lymphatic: No acute vascular abnormality. No mass or adenopathy. Peritoneal: No ascites or pneumoperitoneum. Musculoskeletal: No acute abnormalities. IMPRESSION: 1. Right upper lobe nodule with right hilar, bilateral mediastinal, and thoracic inlet adenopathy. 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 2. History of breast cancer with right axillary dissection. 3. Left nephrectomy for uncertain indication. Electronically Signed   By: Monte Fantasia M.D.   On: 05/11/2015 15:29    Medications:  Scheduled: . cycloSPORINE  1 drop Both Eyes BID  . dexamethasone  4 mg Intravenous 4 times per day  . feeding supplement (ENSURE ENLIVE)  237 mL Oral BID BM  . fluticasone  1 spray Each Nare Daily  . levETIRAcetam  500 mg Oral BID  . losartan  50 mg Oral Daily  . montelukast  10 mg Oral Daily  . pravastatin  20 mg Oral Daily  . vitamin B-12  1,000 mcg Oral Daily   Continuous: . sodium chloride     IDP:OEUMPNTIR  Assessment/Plan:  Principal Problem:   Aphasia Active Problems:   TOBACCO ABUSE   Essential hypertension   Personal history of malignant neoplasm of breast   Abnormal CT scan, head   Pulmonary nodule, right    Transient aphasia and facial droop/metastatic disease CT head showed hypodense areas in the right frontal lobe and left temporal area. MRI brain reveals multiple  subcentimeter supratentorial lesions consistent with metastatic process. Local edema is noted. No mass effect. Facial twitching might have been focal seizure activity. Patient has been initiated on [place]. Twitching has resolved. Neurology is following. Patient is also on Dexamethasone. CT scan of the chest, abdomen and pelvis was completed. Reports as above. Findings concerning for lung cancer with the metastases. Discussed with Dr. Burr Medico with oncology. Also discussed with Dr. Laurence Ferrari with the interventional radiology. Both of them recommend bronchoscopy to get biopsy from one of the mediastinal lymph nodes. Dr. Burr Medico also recommends contacting radiation oncology for brain radiation treatment. Pulmonology and Dr. Sondra Come with radiation oncology consulted.  Pulmonary nodule , likely lung cancer Patient is a smoker. She has lost weight. CT chest as above. Will involve pulmonology for bronchoscopy. Oncology notified as discussed above.  Weight loss Most likely due to cancer. See above.  Microcytic anemia/vitamin B-12 deficiency No obvious signed deficiency noted on anemia panel. Vitamin B-12 is noted to be slightly low. Will initiate supplementation. Folic acid level is normal. Stool for occult blood. No overt bleeding identified. She has never had a colonoscopy.  History of essential hypertension Blood pressure is noted to be borderline low. We'll discontinue Cozaar also. We were alread holding her HCTZ.   History of hyperlipidemia Continue simvastatin.  History of asthma Stable. Continue home medications  Recent UTI Patient was placed on ciprofloxacin this past Friday. UA does not show any infection. Hold off on continuing antibiotics for now.  DVT Prophylaxis: SCDs    Code Status: Full code  Family Communication: Discussed with the patient  Disposition Plan: Workup as outlined above. Mobilize. PT and OT to evaluate.    LOS: 1 day   [facility] Pager  [phone] 05/12/2015, 8:29 AM  If 7PM-7AM, please contact night-coverage at [email or web address], password [facility]

## 2015-05-12 NOTE — Consult Note (Signed)
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 It lasted about 7-8 minutes. EMS was called and she was brought to the hospital. Brain scan showed multiple brain lesions, highly suspicious for metastatic disease. CT chest abdomen and pelvis revealed a 2.3 cm right upper lobe lung nodule, along was hilar and mediastinal adenopathy. Pulmonary was consulted for biopsy. I saw her in the hospital, along with 2 of her granddaughters. She feels well now, denies any significant pain, dyspnea, or other symptoms. She does have mild chronic cough nonproductive. She states that her appetite has been low and she lost about 10-15 pounds in the past year. 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 She has 2 sisters and graduations in the area.  MEDICAL HISTORY:  Past Medical History  Diagnosis Date  . Hypertension   . Hyperlipidemia   . Family history of adverse reaction to anesthesia     "my son passed away I think related to the anesthesia"  . Asthmatic bronchitis   . Chronic bronchitis ([place])   . Sinus headache   . Seizures ([place]) 05/10/2015    "it may have been related to me taking Cipro" (05/11/2015)    SURGICAL HISTORY: Past Surgical History  Procedure Laterality Date  . Appendectomy    . Abdominal hysterectomy    . Laparoscopic cholecystectomy    . Breast biopsy Right   . Breast lumpectomy Right      SOCIAL HISTORY: Social History   Social History  . Marital Status: Divorced    Spouse Name: N/A  . Number of Children: N/A  . Years of Education: N/A   Occupational History  . Not on file.   Social History Main Topics  . Smoking status: Current Every Day Smoker -- 0.50 packs/day for 36 years    Types: Cigarettes  . Smokeless tobacco: Never Used  . Alcohol Use: No  . Drug Use: No  . Sexual Activity: Yes    Birth Control/ Protection: Surgical   Other Topics Concern  . Not on file   Social History Narrative    FAMILY HISTORY: Family History  Problem Relation Age of Onset  . Breast cancer Mother 60  . Gastric cancer Daughter 5    ALLERGIES:  has No Known Allergies.  MEDICATIONS:  Current Facility-Administered Medications  Medication Dose Route Frequency Provider Last Rate Last Dose  . 0.9 %  sodium chloride infusion   Intravenous Continuous Bonnielee Haff, MD 50 mL/hr at 05/12/15 0822 50 mL/hr at 05/12/15 8101  . albuterol (PROVENTIL) (2.5 MG/3ML) 0.083% nebulizer solution 2.5 mg  2.5 mg Nebulization Q6H PRN Etta Quill, DO      . cycloSPORINE (RESTASIS) 0.05 % ophthalmic emulsion 1 drop  1 drop Both Eyes BID Etta Quill, DO   1 drop at 05/12/15 1004  . dexamethasone (DECADRON) injection 4 mg  4 mg Intravenous 4 times per day Bonnielee Haff, MD   4 mg at 05/12/15 1232  .  feeding supplement (ENSURE ENLIVE) (ENSURE ENLIVE) liquid 237 mL  237 mL Oral TID BM Reanne J Barbato, RD      . fluticasone (FLONASE) 50 MCG/ACT nasal spray 1 spray  1 spray Each Nare Daily Etta Quill, DO   1 spray at 05/12/15 1005  . levETIRAcetam (KEPPRA) tablet 500 mg  500 mg Oral BID Bonnielee Haff, MD   500 mg at 05/12/15 1002  . montelukast (SINGULAIR) tablet 10 mg  10 mg Oral Daily Etta Quill, DO   10 mg at 05/12/15 1002  . pravastatin (PRAVACHOL) tablet 20 mg  20 mg Oral Daily Etta Quill, DO   20 mg at 05/12/15 1002  . vitamin B-12 (CYANOCOBALAMIN) tablet 1,000 mcg   1,000 mcg Oral Daily Bonnielee Haff, MD   1,000 mcg at 05/12/15 1005    REVIEW OF SYSTEMS:   Constitutional: Denies fevers, chills or abnormal night sweats Eyes: Denies blurriness of vision, double vision or watery eyes Ears, nose, mouth, throat, and face: Denies mucositis or sore throat Respiratory: Denies cough, dyspnea or wheezes Cardiovascular: Denies palpitation, chest discomfort or lower extremity swelling Gastrointestinal:  Denies nausea, heartburn or change in bowel habits Skin: Denies abnormal skin rashes Lymphatics: Denies new lymphadenopathy or easy bruising Neurological:Denies numbness, tingling or new weaknesses Behavioral/Psych: Mood is stable, no new changes  All other systems were reviewed with the patient and are negative.  PHYSICAL EXAMINATION: ECOG PERFORMANCE STATUS: 0 - Asymptomatic  Filed Vitals:   05/12/15 1026 05/12/15 1422  BP: 118/54 118/54  Pulse: 90 89  Temp: 98.9 F (37.2 C) 98.1 F (36.7 C)  Resp: 20 20   Filed Weights   05/10/15 2227  Weight: 121 lb (54.885 kg)    GENERAL:alert, no distress and comfortable SKIN: skin color, texture, turgor are normal, no rashes or significant lesions EYES: normal, conjunctiva are pink and non-injected, sclera clear OROPHARYNX:no exudate, no erythema and lips, buccal mucosa, and tongue normal  NECK: supple, thyroid normal size, non-tender, without nodularity LYMPH:  no palpable lymphadenopathy in the cervical, axillary or inguinal LUNGS: clear to auscultation and percussion with normal breathing effort HEART: regular rate & rhythm and no murmurs and no lower extremity edema ABDOMEN:abdomen soft, non-tender and normal bowel sounds Musculoskeletal:no cyanosis of digits and no clubbing  PSYCH: alert & oriented x 3 with fluent speech NEURO: no focal motor/sensory deficits  LABORATORY DATA:  I have reviewed the data as listed Lab Results  Component Value Date   WBC 4.7 05/12/2015   HGB 7.8* 05/12/2015   HCT  25.2* 05/12/2015   MCV 78.0 05/12/2015   PLT 484* 05/12/2015    Recent Labs  05/10/15 2305 05/10/15 2313 05/12/15 0334  NA 135 136 136  K 4.4 4.3 4.2  CL 101 101 102  CO2 23  --  23  GLUCOSE 114* 107* 146*  BUN '14 19 8  '$ CREATININE 0.89 0.80 0.65  CALCIUM 9.5  --  9.7  GFRNONAA >60  --  >60  GFRAA >60  --  >60  PROT 7.5  --   --   ALBUMIN 2.6*  --   --   AST 17  --   --   ALT 10*  --   --   ALKPHOS 68  --   --   BILITOT 0.7  --   --     RADIOGRAPHIC STUDIES: I have personally reviewed the radiological images as listed and agreed with the findings in the report. Dg Chest 2 View  05/11/2015  CLINICAL DATA:  Acute onset of fever and abdominal discomfort. Initial encounter. EXAM: CHEST  2 VIEW COMPARISON:  Chest radiograph performed 09/24/2012 FINDINGS: A 2.1 cm nodule is noted near the right lung apex. Peribronchial thickening is noted. No pleural effusion or pneumothorax is seen. The heart remains normal in size. No acute osseous abnormalities are seen. Scattered clips are noted at the right axilla. Clips are noted within the right upper quadrant, reflecting prior cholecystectomy. IMPRESSION: 1. 2.1 cm nodule near the right lung apex. CT of the chest would be helpful for further evaluation, when and as deemed clinically appropriate. 2. Peribronchial thickening noted. Electronically Signed   By: Garald Balding M.D.   On: 05/11/2015 00:36   Ct Head Wo Contrast  05/11/2015  CLINICAL DATA:  10 minutes episode of slurred speech today. Onset at 20:30. EXAM: CT HEAD WITHOUT CONTRAST TECHNIQUE: Contiguous axial images were obtained from the base of the skull through the vertex without intravenous contrast. COMPARISON:  None. FINDINGS: There is extensive hypodensity and sulcal effacement in the right frontal lobe, primarily involving the white matter but some gyral involvement is present. A smaller focus of hypodensity is present in the posterior left temporal lobe, with slight sulcal  effacement. Patchy periventricular white matter hypodensity is present in the left frontal lobe. These areas of hypodensity may represent subacute multifocal infarction. However, vasogenic edema associated with cerebral masses is also a consideration. There is no midline shift. Basal cisterns are patent. There is no intracranial hemorrhage. There is no extra-axial fluid collection. No bony abnormality is evident. IMPRESSION: Large area of hypodensity in the right frontal lobe and smaller hypodense focus in the posterior left temporal lobe, both with sulcal effacement but no midline shift. These may represent nonhemorrhagic subacute infarctions, but brain MRI is recommended to exclude cerebral masses. These results were called by telephone at the time of interpretation on 05/11/2015 at 12:44 am to Dr. Leonides Schanz, who verbally acknowledged these results. Electronically Signed   By: Andreas Newport M.D.   On: 05/11/2015 00:51   Ct Chest W Contrast  05/11/2015  CLINICAL DATA:  Suspected brain metastases.  Pulmonary nodule. EXAM: CT CHEST, ABDOMEN, AND PELVIS WITH CONTRAST TECHNIQUE: Multidetector CT imaging of the chest, abdomen and pelvis was performed following the standard protocol during bolus administration of intravenous contrast. CONTRAST:  1 ISOVUE-300 IOPAMIDOL (ISOVUE-300) INJECTION 61% COMPARISON:  None. FINDINGS: CT CHEST THORACIC INLET/BODY WALL: Heterogeneously enhancing right thoracic node measuring 17 mm. Changes of right axillary dissection for breast cancer. No axillary adenopathy. MEDIASTINUM: Right bronchial, hilar, and bilateral mediastinal adenopathy (bilateral paratracheal). The right paratracheal node is heterogeneously enhancing and measures 27 mm in short axis. Normal heart size. No pericardial effusion. Atherosclerotic calcification, including at the coronary arteries. LUNG WINDOWS: Right upper lobe peripheral pulmonary nodule measuring up to 23 mm. This is likely a primary bronchogenic  carcinoma. Background emphysema. No definitive pulmonary metastasis. A 2 mm left lower lobe pulmonary nodule 3:33 will be seen on surveillance imaging. OSSEOUS: No suspicious lytic or blastic lesions. CT ABDOMEN AND PELVIS Abdominal wall:  No contributory findings. Hepatobiliary: No evidence of hepatic metastasis.Cholecystectomy. Pancreas: Unremarkable. Spleen:  Granulomatous changes. Adrenals/Urinary Tract: Only the right adrenal gland is seen/remains. No evidence of metastasis. Left nephrectomy with compensatory hypertrophy of the right kidney. Simple appearing right renal cyst. Unremarkable bladder. Reproductive:Hysterectomy and possible left oophorectomy. Negative right adnexa. Stomach/Bowel:  No obstruction. No appendicitis. Vascular/Lymphatic: No acute vascular abnormality. No mass or adenopathy. Peritoneal: No ascites or pneumoperitoneum.  Musculoskeletal: No acute abnormalities. IMPRESSION: 1. Right upper lobe nodule with right hilar, bilateral mediastinal, and thoracic inlet adenopathy. 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 2. History of breast cancer with right axillary dissection. 3. Left nephrectomy for uncertain indication. Electronically Signed   By: Monte Fantasia M.D.   On: 05/11/2015 15:29   Mr Jeri Cos IR Contrast  05/11/2015  CLINICAL DATA:  Brief episode of difficulty speaking, facial twitching, drooling LEFT facial droop. Low-grade fever and body aches this week. On treatment for urinary tract infection. Assess for med passed a cyst. EXAM: MRI HEAD WITHOUT AND WITH CONTRAST TECHNIQUE: Multiplanar, multiecho pulse sequences of the brain and surrounding structures were obtained without and with intravenous contrast. CONTRAST:  58m MULTIHANCE GADOBENATE DIMEGLUMINE 529 MG/ML IV SOLN COMPARISON:  CT head May 11, 2015 at 0013 hours FINDINGS: Homogeneously enhancing bright T2 subcentimeter nodules in the supratentorial brain with surrounding T2 bright FLAIR  T2 hyperintense vasogenic edema consistent with metastasis as follows: 3 mm LEFT temporal occipital lobe (axial 23/44 postcontrast). 4 mm RIGHT mesial frontal lobe cortical lesion (axial 29/44 postcontrast). 6 mm round nodule RIGHT posterior frontal lobe cortex (axial 31/44 postcontrast). 2 mm RIGHT frontal and 6 mm RIGHT frontal convexity cortex (axial 37/44 postcontrast). 3 mm RIGHT frontal convexity cortex (axial 40/44 postcontrast. Faint linear enhancing nodule versus vessel within the LEFT frontal cortex/insula (coronal 20/28 postcontrast). No reduced diffusion to suggest acute ischemia or hypercellular tumor. No susceptibility artifact to suggest hemorrhage. Ventricles and sulci are normal for patient's age. Patchy supratentorial white matter FLAIR T2 hyperintensities exclusive of the aforementioned abnormality compatible with moderate chronic small vessel ischemic disease. No midline shift. No abnormal extra-axial fluid collections, dural or leptomeningeal enhancement. No extra-axial masses. Normal major intracranial vascular flow voids seen at the skull base though, dolichoectasia noted which can be seen with chronic hypertension. Ocular globes and orbital contents are normal. Paranasal sinuses and mastoid air cells are well aerated. Convex margin of the pituitary gland without sellar expansion. No cerebellar tonsillar ectopia. No suspicious calvarial bone marrow signal. IMPRESSION: At least 6 subcentimeter supratentorial predominately cortical based enhancing nodules compatible with nonhemorrhagic metastatic disease. Local edema without significant mass effect. Moderate chronic small vessel ischemic disease without acute ischemia. Electronically Signed   By: CElon AlasM.D.   On: 05/11/2015 04:16   Ct Abdomen Pelvis W Contrast  05/11/2015  CLINICAL DATA:  Suspected brain metastases.  Pulmonary nodule. EXAM: CT CHEST, ABDOMEN, AND PELVIS WITH CONTRAST TECHNIQUE: Multidetector CT imaging of the  chest, abdomen and pelvis was performed following the standard protocol during bolus administration of intravenous contrast. CONTRAST:  1 ISOVUE-300 IOPAMIDOL (ISOVUE-300) INJECTION 61% COMPARISON:  None. FINDINGS: CT CHEST THORACIC INLET/BODY WALL: Heterogeneously enhancing right thoracic node measuring 17 mm. Changes of right axillary dissection for breast cancer. No axillary adenopathy. MEDIASTINUM: Right bronchial, hilar, and bilateral mediastinal adenopathy (bilateral paratracheal). The right paratracheal node is heterogeneously enhancing and measures 27 mm in short axis. Normal heart size. No pericardial effusion. Atherosclerotic calcification, including at the coronary arteries. LUNG WINDOWS: Right upper lobe peripheral pulmonary nodule measuring up to 23 mm. This is likely a primary bronchogenic carcinoma. Background emphysema. No definitive pulmonary metastasis. A 2 mm left lower lobe pulmonary nodule 3:33 will be seen on surveillance imaging. OSSEOUS: No suspicious lytic or blastic lesions. CT ABDOMEN AND PELVIS Abdominal wall:  No contributory findings. Hepatobiliary: No evidence of hepatic metastasis.Cholecystectomy. Pancreas: Unremarkable. Spleen:  Granulomatous changes. Adrenals/Urinary Tract: Only the right adrenal  gland is seen/remains. No evidence of metastasis. Left nephrectomy with compensatory hypertrophy of the right kidney. Simple appearing right renal cyst. Unremarkable bladder. Reproductive:Hysterectomy and possible left oophorectomy. Negative right adnexa. Stomach/Bowel:  No obstruction. No appendicitis. Vascular/Lymphatic: No acute vascular abnormality. No mass or adenopathy. Peritoneal: No ascites or pneumoperitoneum. Musculoskeletal: No acute abnormalities. IMPRESSION: 1. Right upper lobe nodule with right hilar, bilateral mediastinal, and thoracic inlet adenopathy. 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 2. History of breast cancer with right  axillary dissection. 3. Left nephrectomy for uncertain indication. Electronically Signed   By: Monte Fantasia M.D.   On: 05/11/2015 15:29    ASSESSMENT & PLAN: A 65 year old female with past medical history of hypertension, 15 pack year history of smoking, presented with seizure like activity, images studies reviewed multiple (at least 6) brain lesions and a 2.3 cm right upper lobe lung nodule, with hilar and mediastinal adenopathy  1. RUL lung mass and brain metastasis, likely metastatic lung cancer  -I reviewed her imaging findings with her and her family members -This is most consistent with metastatic lung cancer. We need a tissue diagnosis to confirm and know the histology subtype, we also need tissue for molecule testing -She was seen by pulmonary service, likely were have bronchoscopy and biopsy -will obtain a bone scan as outpt -need rad/onc to see her for brain radiation, I sent a message to Dr. Lisbeth Renshaw today  -agree with dexa, she is currently on 4 mg every 6 hours, will be titrated by Dr. Lisbeth Renshaw -I will further discuss systemic therapy options, such as chemotherapy, immunotherapy, and a targeted therapy, while we have the tissue diagnosis.  2. Anemia  -Iron study showed low serum iron and saturation, normal ferritin, normal TIBC, most consistent with anemia of chronic disease, likely secondary to her underlying malignancy. She probably has a component of iron deficient anemia also -I recommend her tried ferrous sulfate 2 tablets daily if she can tolerate -please consider IV feraheme '510mg'$ , and I will give second dose as outpt   Plan -await for bronchoscopy and a tissue biopsy, hopefully we'll have adequate tssue for molecule testing -She will likely proceed with brain radiation first -I'll follow up or see her in my clinic for follow-up within a week after her biopsy  Thanks for the opportunity to participate in her care. Please call me for questions  All questions were answered.  The patient knows to call the clinic with any problems, questions or concerns.      Truitt Merle, MD 05/12/2015 5:14 PM    He is was on or with MS Contin was as she is still alive 04 mg is she is

## 2015-05-13 DIAGNOSIS — R930 Abnormal findings on diagnostic imaging of skull and head, not elsewhere classified: Secondary | ICD-10-CM

## 2015-05-13 LAB — BASIC METABOLIC PANEL
ANION GAP: 10 (ref 5–15)
BUN: 14 mg/dL (ref 6–20)
CALCIUM: 9.5 mg/dL (ref 8.9–10.3)
CO2: 25 mmol/L (ref 22–32)
Chloride: 104 mmol/L (ref 101–111)
Creatinine, Ser: 0.69 mg/dL (ref 0.44–1.00)
GLUCOSE: 138 mg/dL — AB (ref 65–99)
POTASSIUM: 4.8 mmol/L (ref 3.5–5.1)
Sodium: 139 mmol/L (ref 135–145)

## 2015-05-13 LAB — CBC
HEMATOCRIT: 25.3 % — AB (ref 36.0–46.0)
Hemoglobin: 8.3 g/dL — ABNORMAL LOW (ref 12.0–15.0)
MCH: 25.6 pg — ABNORMAL LOW (ref 26.0–34.0)
MCHC: 32.8 g/dL (ref 30.0–36.0)
MCV: 78.1 fL (ref 78.0–100.0)
Platelets: 503 10*3/uL — ABNORMAL HIGH (ref 150–400)
RBC: 3.24 MIL/uL — AB (ref 3.87–5.11)
RDW: 13.2 % (ref 11.5–15.5)
WBC: 12.9 10*3/uL — AB (ref 4.0–10.5)

## 2015-05-13 LAB — PROTIME-INR
INR: 1.21 (ref 0.00–1.49)
PROTHROMBIN TIME: 15.5 s — AB (ref 11.6–15.2)

## 2015-05-13 LAB — APTT: aPTT: 35 seconds (ref 24–37)

## 2015-05-13 NOTE — Progress Notes (Signed)
[facility] PROGRESS NOTE  Season Astacio XBL:[id number] DOB: 08/03/1950 DOA: 05/10/2015  PCP: Rogers Blocker, MD  Brief HPI: 65 year old African-American female with a past medical history of hypertension, hyperlipidemia, remote breast cancer (1992) status post lumpectomy who was being treated for a urinary tract infection recently with ciprofloxacin, who presented with the 7-10 minute episode of difficulty speaking, facial twitching. Patient had an abnormal CT scan of the head. She was admitted for further evaluation. Patient found to have several supratentorial lesions. She was found to have findings concerning for lung cancer.  Past medical history:  Past Medical History  Diagnosis Date  . Hypertension   . Hyperlipidemia   . Family history of adverse reaction to anesthesia     "my son passed away I think related to the anesthesia"  . Asthmatic bronchitis   . Chronic bronchitis ([place])   . Sinus headache   . Seizures ([place]) 05/10/2015    "it may have been related to me taking Cipro" (05/11/2015)    Consultants: Neurology. Pulmonology. Radiation oncology. Medical oncology  Procedures:  None  Antibiotics: None  Subjective: Patient feels well today. Denies any headaches.   Objective: Vital Signs  Filed Vitals:   05/13/15 0527 05/13/15 1035 05/13/15 1451 05/13/15 1722  BP: 112/52 109/54 107/46 109/50  Pulse: 88 92 74 84  Temp: 98.6 F (37 C) 98.4 F (36.9 C) 98.8 F (37.1 C) 98.1 F (36.7 C)  TempSrc: Oral Oral Oral Oral  Resp: '20 18 16 18  '$ Height:      Weight:      SpO2: 100% 99% 100% 99%    Intake/Output Summary (Last 24 hours) at 05/13/15 2017 Last data filed at 05/13/15 1000  Gross per 24 hour  Intake    360 ml  Output      0 ml  Net    360 ml   Filed Weights   05/10/15 2227  Weight: 54.885 kg (121 lb)    General appearance: alert, cooperative Resp: clear to auscultation bilaterally Cardio: regular rate and rhythm, S1, S2 normal, no murmur, click,  rub or gallop GI: Abdomen is soft. Nontender today. No masses or organomegaly. Bowel sounds are present. Extremities: extremities normal, atraumatic, no cyanosis or edema Neurologic: Awake and alert.   Lab Results:  Basic Metabolic Panel:  Recent Labs Lab 05/10/15 2305 05/10/15 2313 05/12/15 0334 05/13/15 0557  NA 135 136 136 139  K 4.4 4.3 4.2 4.8  CL 101 101 102 104  CO2 23  --  23 25  GLUCOSE 114* 107* 146* 138*  BUN '14 19 8 14  '$ CREATININE 0.89 0.80 0.65 0.69  CALCIUM 9.5  --  9.7 9.5   Liver Function Tests:  Recent Labs Lab 05/10/15 2305  AST 17  ALT 10*  ALKPHOS 68  BILITOT 0.7  PROT 7.5  ALBUMIN 2.6*   CBC:  Recent Labs Lab 05/10/15 2305 05/10/15 2313 05/12/15 0334 05/13/15 0557  WBC 7.6  --  4.7 12.9*  NEUTROABS 5.0  --   --   --   HGB 8.0* 9.2* 7.8* 8.3*  HCT 25.2* 27.0* 25.2* 25.3*  MCV 77.5*  --  78.0 78.1  PLT 466*  --  484* 503*    Recent Results (from the past 240 hour(s))  Blood culture (routine x 2)     Status: None (Preliminary result)   Collection Time: 05/11/15  1:05 AM  Result Value Ref Range Status   Specimen Description BLOOD RIGHT HAND  Final  Special Requests BOTTLES DRAWN AEROBIC AND ANAEROBIC 5CC  Final   Culture NO GROWTH 2 DAYS  Final   Report Status PENDING  Incomplete  Urine culture     Status: None   Collection Time: 05/11/15  1:17 AM  Result Value Ref Range Status   Specimen Description URINE, RANDOM  Final   Special Requests NONE  Final   Culture NO GROWTH 1 DAY  Final   Report Status 05/12/2015 FINAL  Final  Blood culture (routine x 2)     Status: None (Preliminary result)   Collection Time: 05/11/15  1:45 AM  Result Value Ref Range Status   Specimen Description BLOOD RIGHT ARM  Final   Special Requests IN PEDIATRIC BOTTLE 3CC  Final   Culture NO GROWTH 2 DAYS  Final   Report Status PENDING  Incomplete      Studies/Results: No results found.  Medications:  Scheduled: . cycloSPORINE  1 drop Both Eyes  BID  . dexamethasone  4 mg Intravenous 4 times per day  . feeding supplement (ENSURE ENLIVE)  237 mL Oral TID BM  . fluticasone  1 spray Each Nare Daily  . levETIRAcetam  500 mg Oral BID  . montelukast  10 mg Oral Daily  . pravastatin  20 mg Oral Daily  . vitamin B-12  1,000 mcg Oral Daily   Continuous: . sodium chloride 50 mL/hr (05/12/15 1062)   IRS:[id number]  Assessment/Plan:  Principal Problem:   Aphasia Active Problems:   TOBACCO ABUSE   Essential hypertension   Personal history of malignant neoplasm of breast   Abnormal CT scan, head   Pulmonary nodule, right   Malnutrition of moderate degree   Pyrexia   Lung nodule    Transient aphasia and facial droop/metastatic disease - MRI brain reveals multiple subcentimeter supratentorial lesions consistent with metastatic process - Dr. [place] Pink d/w Dr. Burr [facility] oncology, Dr. Laurence Ferrari with the interventional radiology, recommend bronchoscopy to get biopsy from one of the mediastinal lymph nodes  Partial seizure Secondary to metastatic brain disease and associated edema  LAN including mediastinal, hilar, and thoracic inlet Plan for EBUS in AM 4/13  Asthmatic Bronchitis Continue PRN albuterol HFA fluticasone nasal spray  Weight loss Most likely due to cancer. See above.  Microcytic anemia/vitamin B-12 deficiency No signs of active bleeding CBC in AM  History of essential hypertension Borderline low. Monitor   History of hyperlipidemia Continue simvastatin.  Recent UTI No clear evidence of acute UTI, ABX stopped   DVT Prophylaxis: SCDs    Code Status: Full code  Family Communication: Discussed with the patient  Disposition Plan: Plan d/c tomorrow after bronch     LOS: 2 days   Faye Ramsay  [facility] Pager [phone] 05/13/2015, 8:17 PM  If 7PM-7AM, please contact night-coverage at [email or web address], password [facility]

## 2015-05-13 NOTE — Progress Notes (Signed)
OT Cancellation Note  Patient Details Name: Reygan Heagle MRN: [id number] DOB: 1950/10/19   Cancelled Treatment:    Reason Eval/Treat Not Completed: OT screened, no needs identified, will sign off  Benito Mccreedy OTR/L [phone] 05/13/2015, 9:42 AM

## 2015-05-13 NOTE — Progress Notes (Signed)
Name: Rose [name] MRN: [id number] DOB: [birth year]    ADMISSION DATE:  05/10/2015 CONSULTATION DATE:  05/12/2015  REFERRING MD :  [place] Pink  CHIEF COMPLAINT:  Brain lesions  BRIEF PATIENT DESCRIPTION: 65 -year-old female with remote history of breast cancer status post lumpectomy in 1992. Found have several supratentorial lesions after presenting with dysphasia and facial twitching. Mediastinal lymphadenopathy also noted, pulmonary consulted for potential bronchoscopic biopsy.  SIGNIFICANT EVENTS    STUDIES:  CT head 4/10 > Large area of hypodensity in the right frontal lobe and smaller hypodense focus in the posterior left temporal lobe, both with sulcal effacement but no midline shift. These may represent nonhemorrhagic subacute infarctions, but brain MRI is recommended to exclude cerebral masses. MRI Brain 4/10 > At least 6 subcentimeter supratentorial predominately cortical based enhancing nodules compatible with nonhemorrhagic metastatic disease. Local edema without significant mass effect. CT abd/pel/chest 4/10 > Right upper lobe nodule with right hilar, bilateral mediastinal, and thoracic inlet adenopathy. 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 History of breast cancer with right axillary dissection. Left nephrectomy for uncertain indication.   HISTORY OF PRESENT ILLNESS:  65 year old female past medical history as below, which includes breast cancer status post lumpectomy in 1992, hypertension, asthmatic bronchitis, seasonal allergies. She is a current every day smoker and smokes about 5-7 cigarettes per day. She has been smoking for upwards of 30 years. She denies any alcohol or illegal substance abuse. She was recently treated with antibiotics for UTI after noting that she would awaken from sleep several times a night to void her bladder. Besides UTI, she was in her usual state of health until 4/9 late p.m. when she developed some facial  twitching on the right side and difficulty speaking. She thought she was having a stroke and attempted to chew aspirin she called 911. Upon the time they had arrived her symptoms have resolved. She presented to the emergency department and underwent CT scan of the head which prompted MRI of the brain, which demonstrated at least 6 subcentimeter supratentorial enhancing nodules concerning for metastatic disease. CT scan of the abdomen and pelvis and chest were performed, and demonstrated lymphadenopathy. Pulmonary consulted for possible bronchoscopic biopsy.  SUBJECTIVE:  No new issues or complaints  VITAL SIGNS: Temp:  [97.9 F (36.6 C)-98.8 F (37.1 C)] 98.8 F (37.1 C) (04/12 1451) Pulse Rate:  [74-98] 74 (04/12 1451) Resp:  [16-20] 16 (04/12 1451) BP: (100-120)/(44-60) 107/46 mmHg (04/12 1451) SpO2:  [99 %-100 %] 100 % (04/12 1451)  PHYSICAL EXAMINATION: General:  Female of normal body habitus in NAD Neuro:  Alert, oriented, nonfocal HEENT:  [place]/AT, PERRL, no JVD Cardiovascular:  RRR, no MRG Lungs:  Intermittent faint expiratory wheeze Abdomen:  Soft, non-tender, non-distended Musculoskeletal:  No acute deformity or ROM limitation Skin:  Grossly intact   Recent Labs Lab 05/10/15 2305 05/10/15 2313 05/12/15 0334 05/13/15 0557  NA 135 136 136 139  K 4.4 4.3 4.2 4.8  CL 101 101 102 104  CO2 23  --  23 25  BUN '14 19 8 14  '$ CREATININE 0.89 0.80 0.65 0.69  GLUCOSE 114* 107* 146* 138*    Recent Labs Lab 05/10/15 2305 05/10/15 2313 05/12/15 0334 05/13/15 0557  HGB 8.0* 9.2* 7.8* 8.3*  HCT 25.2* 27.0* 25.2* 25.3*  WBC 7.6  --  4.7 12.9*  PLT 466*  --  484* 503*   No results found.  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 If no dx achieved on quick staining of the LAD then will proceed  with navigational FOB to the RUL nodule. Scheduled for 12:00 4/13 - procedure discussed with the patient, all questions answered.   Asthmatic Bronchitis - Continue PRN albuterol HFA - fluticasone nasal spray  Baltazar Apo, MD, PhD 05/13/2015, 3:33 PM [facility] [phone] or if no answer [phone]

## 2015-05-13 NOTE — Consult Note (Signed)
Radiation Oncology         [phone] ________________________________  Name: Rose [name] MRN: [id number]  Date: 05/10/2015  DOB: [birth year]  HK:[id number] Carlean Jews Marlou Sa, MD  No ref. provider found     REFERRING PHYSICIAN:   DIAGNOSIS: The primary encounter diagnosis was Aphasia. Diagnoses of Fever, unspecified fever cause, Weight loss, and Lung nodule were also pertinent to this visit. Probable advanced lung cancer with metastatic disease to the brain.   HISTORY OF PRESENT ILLNESS: Rose [name] is a 65 y.o. female seen at the request of Dr. Burr Medico for a probable lung cancer with metastatic disease to the brain. Patient reports that she is been doing well until she was treated about a week ago for a urinary tract infection. She states that he experienced an episode of seizure activity, which prompted her assessment emergency department. There, a CT scan of the head on 05/11/2015 revealed a large area of hypodensity in the right frontal lobe is smaller hypodense focus in the posterior left temporal lobe. No midline shift or hemorrhage was present. An MRI of the brain with and without contrast on the same date, revealed approximately 6 subcentimeter lesions within the brain, along the left temporal occipital lobe, right mesial frontal lobe, right posterior frontal lobe cortex, right frontal convexity cortex, and faint nodule versus vessel in the left frontal cortex/insula. A CT scan of the chest abdomen and pelvis was then performed to evaluate for primary tumor. Within the right upper lobe, a 2.3 cm nodule is present, bilateral hilar and mediastinal adenopathy was also identified. No evidence of visceral metastases were identified. She has plans to undergo further assessment navigational bronchoscopy with Dr. Lamonte Sakai. She is unsure of when this will occur. We are asked to see the patient regarding the role for radiotherapy to the brain. Since her admission, she has been put on Keppra 500 mg twice a day, and  dexamethasone 4 mg 4 times a day.    PREVIOUS RADIATION THERAPY: No   PAST MEDICAL HISTORY:  Past Medical History  Diagnosis Date  . Hypertension   . Hyperlipidemia   . Family history of adverse reaction to anesthesia     "my son passed away I think related to the anesthesia"  . Asthmatic bronchitis   . Chronic bronchitis ([place])   . Sinus headache   . Seizures ([place]) 05/10/2015    "it may have been related to me taking Cipro" (05/11/2015)       PAST SURGICAL HISTORY: Past Surgical History  Procedure Laterality Date  . Appendectomy    . Abdominal hysterectomy    . Laparoscopic cholecystectomy    . Breast biopsy Right   . Breast lumpectomy Right      FAMILY HISTORY:  Family History  Problem Relation Age of Onset  . Breast cancer Mother 30  . Gastric cancer Daughter 74     SOCIAL HISTORY:  reports that she has been smoking Cigarettes.  She has a 18 pack-year smoking history. She has never used smokeless tobacco. She reports that she does not drink alcohol or use illicit drugs. The patient is single and resides in [place]. She has adult grandchildren who attend [employer], both of whom are very helpful and attempted to her knees. She has sisters as well as living [place] help her with transportation. She works as a Programmer, applications.   ALLERGIES: Review of patient's allergies indicates no known allergies.   MEDICATIONS:  Current Facility-Administered Medications  Medication Dose Route Frequency Provider Last Rate  Last Dose  . 0.9 %  sodium chloride infusion   Intravenous Continuous Bonnielee Haff, MD 50 mL/hr at 05/12/15 0822 50 mL/hr at 05/12/15 0867  . albuterol (PROVENTIL) (2.5 MG/3ML) 0.083% nebulizer solution 2.5 mg  2.5 mg Nebulization Q6H PRN Etta Quill, DO      . cycloSPORINE (RESTASIS) 0.05 % ophthalmic emulsion 1 drop  1 drop Both Eyes BID Etta Quill, DO   1 drop at 05/13/15 1151  . dexamethasone (DECADRON) injection 4 mg  4 mg Intravenous 4 times per  day Bonnielee Haff, MD   4 mg at 05/13/15 1156  . feeding supplement (ENSURE ENLIVE) (ENSURE ENLIVE) liquid 237 mL  237 mL Oral TID BM Reanne J Barbato, RD   237 mL at 05/13/15 0929  . fluticasone (FLONASE) 50 MCG/ACT nasal spray 1 spray  1 spray Each Nare Daily Etta Quill, DO   1 spray at 05/13/15 0929  . levETIRAcetam (KEPPRA) tablet 500 mg  500 mg Oral BID Bonnielee Haff, MD   500 mg at 05/13/15 0928  . montelukast (SINGULAIR) tablet 10 mg  10 mg Oral Daily Etta Quill, DO   10 mg at 05/13/15 6195  . pravastatin (PRAVACHOL) tablet 20 mg  20 mg Oral Daily Etta Quill, DO   20 mg at 05/13/15 0932  . vitamin B-12 (CYANOCOBALAMIN) tablet 1,000 mcg  1,000 mcg Oral Daily Bonnielee Haff, MD   1,000 mcg at 05/13/15 6712     REVIEW OF SYSTEMS: On review of systems, the patient reports that s heis doing well overall since her steroids are noted. She has had no episodes of recurrent seizure activity. She states that she is not having any trouble with ectasia time, and has noted complete resolution of her extremity weakness. She denies any chest pain, shortness of breath, cough, fevers, chills, night sweats. She has lost about 10 pounds in the last month unintentionally. Her appetite has been fair. She denies any bowel or bladder disturbances, and denies abdominal pain, nausea or vomiting. She denies any new musculoskeletal or joint aches or pains. A complete review of systems is obtained and is otherwise negative.     PHYSICAL EXAM:  height is '5\' 1"'$  (1.549 m) and weight is 121 lb (54.885 kg). Her oral temperature is 98.4 F (36.9 C). Her blood pressure is 109/54 and her pulse is 92. Her respiration is 18 and oxygen saturation is 99%.   Pain scale 0/10 In general this is a well appearing African-American female in no acute distress. She is alert and oriented x4 and appropriate throughout the examination. HEENT reveals that the patient is normocephalic, atraumatic. EOMs are intact. PERRLA. Skin  is intact without any evidence of gross lesions. Cardiopulmonary assessment is negative for acute distress and she exhibits normal effort. From a neurologic her perspective, she is grossly intact, without any focal abnormalities grossly of cranial nerves II through XII.   ECOG = 1  0 - Asymptomatic (Fully active, able to carry on all predisease activities without restriction)  1 - Symptomatic but completely ambulatory (Restricted in physically strenuous activity but ambulatory and able to carry out work of a light or sedentary nature. For example, light housework, office work)  2 - Symptomatic, <50% in bed during the day (Ambulatory and capable of all self care but unable to carry out any work activities. Up and about more than 50% of waking hours)  3 - Symptomatic, >50% in bed, but not bedbound (Capable of only limited  self-care, confined to bed or chair 50% or more of waking hours)  4 - Bedbound (Completely disabled. Cannot carry on any self-care. Totally confined to bed or chair)  5 - Death   Eustace Pen MM, Creech RH, Tormey DC, et al. [phone]). "Toxicity and response criteria of the [facility]". [place] Oncol. 5 (6): 649-55    LABORATORY DATA:  Lab Results  Component Value Date   WBC 12.9* 05/13/2015   HGB 8.3* 05/13/2015   HCT 25.3* 05/13/2015   MCV 78.1 05/13/2015   PLT 503* 05/13/2015   Lab Results  Component Value Date   NA 139 05/13/2015   K 4.8 05/13/2015   CL 104 05/13/2015   CO2 25 05/13/2015   Lab Results  Component Value Date   ALT 10* 05/10/2015   AST 17 05/10/2015   ALKPHOS 68 05/10/2015   BILITOT 0.7 05/10/2015      RADIOGRAPHY: Dg Chest 2 View  05/11/2015  CLINICAL DATA:  Acute onset of fever and abdominal discomfort. Initial encounter. EXAM: CHEST  2 VIEW COMPARISON:  Chest radiograph performed 09/24/2012 FINDINGS: A 2.1 cm nodule is noted near the right lung apex. Peribronchial thickening is noted. No pleural effusion or  pneumothorax is seen. The heart remains normal in size. No acute osseous abnormalities are seen. Scattered clips are noted at the right axilla. Clips are noted within the right upper quadrant, reflecting prior cholecystectomy. IMPRESSION: 1. 2.1 cm nodule near the right lung apex. CT of the chest would be helpful for further evaluation, when and as deemed clinically appropriate. 2. Peribronchial thickening noted. Electronically Signed   By: Garald Balding M.D.   On: 05/11/2015 00:36   Ct Head Wo Contrast  05/11/2015  CLINICAL DATA:  10 minutes episode of slurred speech today. Onset at 20:30. EXAM: CT HEAD WITHOUT CONTRAST TECHNIQUE: Contiguous axial images were obtained from the base of the skull through the vertex without intravenous contrast. COMPARISON:  None. FINDINGS: There is extensive hypodensity and sulcal effacement in the right frontal lobe, primarily involving the white matter but some gyral involvement is present. A smaller focus of hypodensity is present in the posterior left temporal lobe, with slight sulcal effacement. Patchy periventricular white matter hypodensity is present in the left frontal lobe. These areas of hypodensity may represent subacute multifocal infarction. However, vasogenic edema associated with cerebral masses is also a consideration. There is no midline shift. Basal cisterns are patent. There is no intracranial hemorrhage. There is no extra-axial fluid collection. No bony abnormality is evident. IMPRESSION: Large area of hypodensity in the right frontal lobe and smaller hypodense focus in the posterior left temporal lobe, both with sulcal effacement but no midline shift. These may represent nonhemorrhagic subacute infarctions, but brain MRI is recommended to exclude cerebral masses. These results were called by telephone at the time of interpretation on 05/11/2015 at 12:44 am to Dr. Leonides Schanz, who verbally acknowledged these results. Electronically Signed   By: Andreas Newport M.D.    On: 05/11/2015 00:51   Ct Chest W Contrast  05/11/2015  CLINICAL DATA:  Suspected brain metastases.  Pulmonary nodule. EXAM: CT CHEST, ABDOMEN, AND PELVIS WITH CONTRAST TECHNIQUE: Multidetector CT imaging of the chest, abdomen and pelvis was performed following the standard protocol during bolus administration of intravenous contrast. CONTRAST:  1 ISOVUE-300 IOPAMIDOL (ISOVUE-300) INJECTION 61% COMPARISON:  None. FINDINGS: CT CHEST THORACIC INLET/BODY WALL: Heterogeneously enhancing right thoracic node measuring 17 mm. Changes of right axillary dissection for breast cancer. No axillary  adenopathy. MEDIASTINUM: Right bronchial, hilar, and bilateral mediastinal adenopathy (bilateral paratracheal). The right paratracheal node is heterogeneously enhancing and measures 27 mm in short axis. Normal heart size. No pericardial effusion. Atherosclerotic calcification, including at the coronary arteries. LUNG WINDOWS: Right upper lobe peripheral pulmonary nodule measuring up to 23 mm. This is likely a primary bronchogenic carcinoma. Background emphysema. No definitive pulmonary metastasis. A 2 mm left lower lobe pulmonary nodule 3:33 will be seen on surveillance imaging. OSSEOUS: No suspicious lytic or blastic lesions. CT ABDOMEN AND PELVIS Abdominal wall:  No contributory findings. Hepatobiliary: No evidence of hepatic metastasis.Cholecystectomy. Pancreas: Unremarkable. Spleen:  Granulomatous changes. Adrenals/Urinary Tract: Only the right adrenal gland is seen/remains. No evidence of metastasis. Left nephrectomy with compensatory hypertrophy of the right kidney. Simple appearing right renal cyst. Unremarkable bladder. Reproductive:Hysterectomy and possible left oophorectomy. Negative right adnexa. Stomach/Bowel:  No obstruction. No appendicitis. Vascular/Lymphatic: No acute vascular abnormality. No mass or adenopathy. Peritoneal: No ascites or pneumoperitoneum. Musculoskeletal: No acute abnormalities. IMPRESSION: 1.  Right upper lobe nodule with right hilar, bilateral mediastinal, and thoracic inlet adenopathy. 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 2. History of breast cancer with right axillary dissection. 3. Left nephrectomy for uncertain indication. Electronically Signed   By: Monte Fantasia M.D.   On: 05/11/2015 15:29   Mr Jeri Cos FA Contrast  05/11/2015  CLINICAL DATA:  Brief episode of difficulty speaking, facial twitching, drooling LEFT facial droop. Low-grade fever and body aches this week. On treatment for urinary tract infection. Assess for med passed a cyst. EXAM: MRI HEAD WITHOUT AND WITH CONTRAST TECHNIQUE: Multiplanar, multiecho pulse sequences of the brain and surrounding structures were obtained without and with intravenous contrast. CONTRAST:  91m MULTIHANCE GADOBENATE DIMEGLUMINE 529 MG/ML IV SOLN COMPARISON:  CT head May 11, 2015 at 0013 hours FINDINGS: Homogeneously enhancing bright T2 subcentimeter nodules in the supratentorial brain with surrounding T2 bright FLAIR T2 hyperintense vasogenic edema consistent with metastasis as follows: 3 mm LEFT temporal occipital lobe (axial 23/44 postcontrast). 4 mm RIGHT mesial frontal lobe cortical lesion (axial 29/44 postcontrast). 6 mm round nodule RIGHT posterior frontal lobe cortex (axial 31/44 postcontrast). 2 mm RIGHT frontal and 6 mm RIGHT frontal convexity cortex (axial 37/44 postcontrast). 3 mm RIGHT frontal convexity cortex (axial 40/44 postcontrast. Faint linear enhancing nodule versus vessel within the LEFT frontal cortex/insula (coronal 20/28 postcontrast). No reduced diffusion to suggest acute ischemia or hypercellular tumor. No susceptibility artifact to suggest hemorrhage. Ventricles and sulci are normal for patient's age. Patchy supratentorial white matter FLAIR T2 hyperintensities exclusive of the aforementioned abnormality compatible with moderate chronic small vessel ischemic disease. No midline shift. No  abnormal extra-axial fluid collections, dural or leptomeningeal enhancement. No extra-axial masses. Normal major intracranial vascular flow voids seen at the skull base though, dolichoectasia noted which can be seen with chronic hypertension. Ocular globes and orbital contents are normal. Paranasal sinuses and mastoid air cells are well aerated. Convex margin of the pituitary gland without sellar expansion. No cerebellar tonsillar ectopia. No suspicious calvarial bone marrow signal. IMPRESSION: At least 6 subcentimeter supratentorial predominately cortical based enhancing nodules compatible with nonhemorrhagic metastatic disease. Local edema without significant mass effect. Moderate chronic small vessel ischemic disease without acute ischemia. Electronically Signed   By: CElon AlasM.D.   On: 05/11/2015 04:16   Ct Abdomen Pelvis W Contrast  05/11/2015  CLINICAL DATA:  Suspected brain metastases.  Pulmonary nodule. EXAM: CT CHEST, ABDOMEN, AND PELVIS WITH CONTRAST TECHNIQUE: Multidetector CT imaging of the  chest, abdomen and pelvis was performed following the standard protocol during bolus administration of intravenous contrast. CONTRAST:  1 ISOVUE-300 IOPAMIDOL (ISOVUE-300) INJECTION 61% COMPARISON:  None. FINDINGS: CT CHEST THORACIC INLET/BODY WALL: Heterogeneously enhancing right thoracic node measuring 17 mm. Changes of right axillary dissection for breast cancer. No axillary adenopathy. MEDIASTINUM: Right bronchial, hilar, and bilateral mediastinal adenopathy (bilateral paratracheal). The right paratracheal node is heterogeneously enhancing and measures 27 mm in short axis. Normal heart size. No pericardial effusion. Atherosclerotic calcification, including at the coronary arteries. LUNG WINDOWS: Right upper lobe peripheral pulmonary nodule measuring up to 23 mm. This is likely a primary bronchogenic carcinoma. Background emphysema. No definitive pulmonary metastasis. A 2 mm left lower lobe pulmonary  nodule 3:33 will be seen on surveillance imaging. OSSEOUS: No suspicious lytic or blastic lesions. CT ABDOMEN AND PELVIS Abdominal wall:  No contributory findings. Hepatobiliary: No evidence of hepatic metastasis.Cholecystectomy. Pancreas: Unremarkable. Spleen:  Granulomatous changes. Adrenals/Urinary Tract: Only the right adrenal gland is seen/remains. No evidence of metastasis. Left nephrectomy with compensatory hypertrophy of the right kidney. Simple appearing right renal cyst. Unremarkable bladder. Reproductive:Hysterectomy and possible left oophorectomy. Negative right adnexa. Stomach/Bowel:  No obstruction. No appendicitis. Vascular/Lymphatic: No acute vascular abnormality. No mass or adenopathy. Peritoneal: No ascites or pneumoperitoneum. Musculoskeletal: No acute abnormalities. IMPRESSION: 1. Right upper lobe nodule with right hilar, bilateral mediastinal, and thoracic inlet adenopathy. 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 2. History of breast cancer with right axillary dissection. 3. Left nephrectomy for uncertain indication. Electronically Signed   By: Monte Fantasia M.D.   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   PLAN: I discussed the patient's case with Dr. Lisbeth Renshaw, and he has recommended proceeding with tissue confirmation of biopsy to determine histology. Again appears to be consistent with lung cancer, I discussed her case with the attending hospitalist and Dr. Lamonte Sakai. The importance of the biopsy and termination of histology plays a significant role in whether or not she would be a candidate for [facility] treatment versus whole brain radiation. Each of the options were discussed with the patient, and we discussed briefly the newborn physically each. If she were to proceed with whole brain radiation would offer her 30 grays in 10 fractions to the whole brain versus  potentially 1 treatment of SRS. We will plan to follow up with the patient for simulation next Wednesday with the expectation that we would have a diagnosis of that time. I will be in close contact with the patient to help confirm which route of therapy we would recommend, our contact information was provided to the patient, and we will see her back next week.  The above documentation reflects my direct findings during this shared patient visit. Please see the separate note by Dr. Lisbeth Renshaw on this date for the remainder of the patient's plan of care.    Carola Rhine, PAC

## 2015-05-13 NOTE — Evaluation (Signed)
Physical Therapy Evaluation Patient Details Name: Taniesha Glanz MRN: [id number] DOB: 1950-07-24 Today's Date: 05/13/2015   History of Present Illness  Constancia Geeting is a 65 y.o. female with h/o HTN, HLD, tobacco abuse, BRCA in distant past. Patient presents to the ED after a 7-10 min episode of difficulty speaking, swallowing, drooling, and facial twitching. Per EMS patient had left sided facial droop. No prior h/o CVA. 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   Clinical Impression  Patient evaluated by Physical Therapy with no further acute PT needs identified. All education has been completed and the patient has no further questions. Pt independent with mobility, no visual deficits, strength or balance issues noted.  See below for any follow-up Physical Therapy or equipment needs. PT is signing off. Thank you for this referral.     Follow Up Recommendations No PT follow up    Equipment Recommendations  None recommended by PT    Recommendations for Other Services       Precautions / Restrictions Precautions Precautions: None Restrictions Weight Bearing Restrictions: No      Mobility  Bed Mobility Overal bed mobility: Independent                Transfers Overall transfer level: Independent                  Ambulation/Gait Ambulation/Gait assistance: Independent Ambulation Distance (Feet): 300 Feet Assistive device: None Gait Pattern/deviations: WFL(Within Functional Limits) Gait velocity: WFL Gait velocity interpretation: at or above normal speed for age/gender General Gait Details: pt ambulated without support, able to change speed and directions without problem. Performed horizontal and vertical head turns during ambulation without dizziness   Stairs Stairs: Yes Stairs assistance: Modified independent (Device/Increase time) Stair Management: One rail Right;Alternating pattern;Forwards Number of Stairs: 4 General stair comments: no  balance or strength issues noted on stairs  Wheelchair Mobility    Modified Rankin (Stroke Patients Only)       Balance Overall balance assessment: Independent                                           Pertinent Vitals/Pain Pain Assessment: No/denies pain    Home Living Family/patient expects to be discharged to:: Private residence Living Arrangements: Alone   Type of Home: House         Home Equipment: None Additional Comments: pt works part time as [employer] 4 hrs/ day.     Prior Function Level of Independence: Independent               Hand Dominance        Extremity/Trunk Assessment   Upper Extremity Assessment: Overall WFL for tasks assessed           Lower Extremity Assessment: Overall WFL for tasks assessed      Cervical / Trunk Assessment: Normal  Communication   Communication: No difficulties  Cognition Arousal/Alertness: Awake/alert Behavior During Therapy: WFL for tasks assessed/performed Overall Cognitive Status: Within Functional Limits for tasks assessed                      General Comments General comments (skin integrity, edema, etc.): vision screened with no deficits noted    Exercises        Assessment/Plan    PT Assessment Patent does not need any further PT services  PT Diagnosis  PT Problem List    PT Treatment Interventions     PT Goals (Current goals can be found in the Care Plan section) Acute Rehab PT Goals Patient Stated Goal: return home PT Goal Formulation: All assessment and education complete, DC therapy    Frequency     Barriers to discharge        Co-evaluation               End of Session   Activity Tolerance: Patient tolerated treatment well Patient left: in bed;with call bell/phone within reach Nurse Communication: Mobility status         Time: 5974-7185 PT Time Calculation (min) (ACUTE ONLY): 11 min   Charges:   PT Evaluation $PT Eval Low  Complexity: 1 Procedure     PT G Codes:      Leighton Roach, PT  Acute Rehab Services  [place], [place] 05/13/2015, 9:36 AM

## 2015-05-14 ENCOUNTER — Inpatient Hospital Stay (HOSPITAL_COMMUNITY): Payer: BLUE CROSS/BLUE SHIELD

## 2015-05-14 ENCOUNTER — Encounter (HOSPITAL_COMMUNITY)
Admission: EM | Disposition: A | Discharge status: Home / Self Care | Payer: Self-pay | Source: Home / Self Care | Attending: Internal Medicine

## 2015-05-14 ENCOUNTER — Inpatient Hospital Stay (HOSPITAL_COMMUNITY): Payer: BLUE CROSS/BLUE SHIELD | Admitting: Certified Registered"

## 2015-05-14 ENCOUNTER — Encounter (HOSPITAL_COMMUNITY): Payer: Self-pay | Admitting: Certified Registered"

## 2015-05-14 ENCOUNTER — Other Ambulatory Visit: Payer: Self-pay | Admitting: Hematology

## 2015-05-14 DIAGNOSIS — Z419 Encounter for procedure for purposes other than remedying health state, unspecified: Secondary | ICD-10-CM | POA: Insufficient documentation

## 2015-05-14 DIAGNOSIS — R590 Localized enlarged lymph nodes: Secondary | ICD-10-CM | POA: Diagnosis present

## 2015-05-14 DIAGNOSIS — R599 Enlarged lymph nodes, unspecified: Secondary | ICD-10-CM

## 2015-05-14 HISTORY — PX: VIDEO BRONCHOSCOPY WITH ENDOBRONCHIAL NAVIGATION: SHX6175

## 2015-05-14 HISTORY — PX: VIDEO BRONCHOSCOPY WITH ENDOBRONCHIAL ULTRASOUND: SHX6177

## 2015-05-14 LAB — CBC
HCT: 26.3 % — ABNORMAL LOW (ref 36.0–46.0)
Hemoglobin: 8 g/dL — ABNORMAL LOW (ref 12.0–15.0)
MCH: 23.9 pg — ABNORMAL LOW (ref 26.0–34.0)
MCHC: 30.4 g/dL (ref 30.0–36.0)
MCV: 78.5 fL (ref 78.0–100.0)
PLATELETS: 505 10*3/uL — AB (ref 150–400)
RBC: 3.35 MIL/uL — AB (ref 3.87–5.11)
RDW: 13.4 % (ref 11.5–15.5)
WBC: 12.1 10*3/uL — AB (ref 4.0–10.5)

## 2015-05-14 LAB — BASIC METABOLIC PANEL
ANION GAP: 9 (ref 5–15)
BUN: 15 mg/dL (ref 6–20)
CALCIUM: 9.5 mg/dL (ref 8.9–10.3)
CO2: 26 mmol/L (ref 22–32)
Chloride: 103 mmol/L (ref 101–111)
Creatinine, Ser: 0.72 mg/dL (ref 0.44–1.00)
GLUCOSE: 132 mg/dL — AB (ref 65–99)
Potassium: 4.8 mmol/L (ref 3.5–5.1)
SODIUM: 138 mmol/L (ref 135–145)

## 2015-05-14 LAB — MRSA PCR SCREENING: MRSA BY PCR: NEGATIVE

## 2015-05-14 SURGERY — VIDEO BRONCHOSCOPY WITH ENDOBRONCHIAL NAVIGATION
Anesthesia: General

## 2015-05-14 MED ORDER — SUGAMMADEX SODIUM 500 MG/5ML IV SOLN
INTRAVENOUS | Status: DC | PRN
  Administered 2015-05-14: 150 mg via INTRAVENOUS

## 2015-05-14 MED ORDER — PROPOFOL 10 MG/ML IV BOLUS
INTRAVENOUS | Status: DC | PRN
  Administered 2015-05-14: 120 mg via INTRAVENOUS

## 2015-05-14 MED ORDER — DEXAMETHASONE SODIUM PHOSPHATE 4 MG/ML IJ SOLN
INTRAMUSCULAR | Status: AC
  Filled 2015-05-14: qty 3

## 2015-05-14 MED ORDER — ROCURONIUM BROMIDE 50 MG/5ML IV SOLN
INTRAVENOUS | Status: AC
  Filled 2015-05-14: qty 4

## 2015-05-14 MED ORDER — LACTATED RINGERS IV SOLN
INTRAVENOUS | Status: DC
  Administered 2015-05-14 (×2): via INTRAVENOUS

## 2015-05-14 MED ORDER — ALBUTEROL SULFATE (2.5 MG/3ML) 0.083% IN NEBU: 2.5000 mg | INHALATION_SOLUTION | Freq: Four times a day (QID) | RESPIRATORY_TRACT | Status: DC | PRN

## 2015-05-14 MED ORDER — LIDOCAINE HCL (CARDIAC) 20 MG/ML IV SOLN
INTRAVENOUS | Status: DC | PRN
  Administered 2015-05-14: 100 mg via INTRATRACHEAL
  Administered 2015-05-14: 50 mg via INTRAVENOUS

## 2015-05-14 MED ORDER — SUGAMMADEX SODIUM 500 MG/5ML IV SOLN
INTRAVENOUS | Status: AC
  Filled 2015-05-14: qty 5

## 2015-05-14 MED ORDER — PROPOFOL 10 MG/ML IV BOLUS
INTRAVENOUS | Status: AC
  Filled 2015-05-14: qty 20

## 2015-05-14 MED ORDER — ONDANSETRON HCL 4 MG/2ML IJ SOLN
INTRAMUSCULAR | Status: DC | PRN
  Administered 2015-05-14: 4 mg via INTRAVENOUS

## 2015-05-14 MED ORDER — FENTANYL CITRATE (PF) 250 MCG/5ML IJ SOLN
INTRAMUSCULAR | Status: AC
  Filled 2015-05-14: qty 5

## 2015-05-14 MED ORDER — LEVETIRACETAM 500 MG PO TABS: 500.0000 mg | ORAL_TABLET | Freq: Two times a day (BID) | ORAL | Status: DC

## 2015-05-14 MED ORDER — FENTANYL CITRATE (PF) 250 MCG/5ML IJ SOLN
INTRAMUSCULAR | Status: DC | PRN
  Administered 2015-05-14 (×2): 50 ug via INTRAVENOUS

## 2015-05-14 MED ORDER — 0.9 % SODIUM CHLORIDE (POUR BTL) OPTIME
TOPICAL | Status: DC | PRN
  Administered 2015-05-14: 1000 mL

## 2015-05-14 MED ORDER — ROCURONIUM BROMIDE 100 MG/10ML IV SOLN
INTRAVENOUS | Status: DC | PRN
  Administered 2015-05-14: 40 mg via INTRAVENOUS
  Administered 2015-05-14: 10 mg via INTRAVENOUS
  Administered 2015-05-14: 20 mg via INTRAVENOUS

## 2015-05-14 MED ORDER — DEXAMETHASONE 4 MG PO TABS: 4.0000 mg | ORAL_TABLET | Freq: Four times a day (QID) | ORAL | Status: DC

## 2015-05-14 MED ORDER — SUCCINYLCHOLINE CHLORIDE 20 MG/ML IJ SOLN
INTRAMUSCULAR | Status: AC
  Filled 2015-05-14: qty 3

## 2015-05-14 MED ORDER — DEXAMETHASONE SODIUM PHOSPHATE 4 MG/ML IJ SOLN
INTRAMUSCULAR | Status: DC | PRN
  Administered 2015-05-14: 4 mg via INTRAVENOUS

## 2015-05-14 MED ORDER — HYDROMORPHONE HCL 1 MG/ML IJ SOLN: 0.2500 mg | INTRAMUSCULAR | Status: DC | PRN

## 2015-05-14 MED ORDER — ONDANSETRON HCL 4 MG/2ML IJ SOLN
INTRAMUSCULAR | Status: AC
  Filled 2015-05-14: qty 6

## 2015-05-14 SURGICAL SUPPLY — 44 items
ADAPTER BRONCH F/PENTAX (ADAPTER) ×3 IMPLANT
BRUSH CYTOL CELLEBRITY 1.5X140 (MISCELLANEOUS) IMPLANT
BRUSH SUPERTRAX BIOPSY (INSTRUMENTS) IMPLANT
BRUSH SUPERTRAX NDL-TIP CYTO (INSTRUMENTS) ×6 IMPLANT
CANISTER SUCTION 2500CC (MISCELLANEOUS) ×3 IMPLANT
CHANNEL WORK EXTEND EDGE 180 (KITS) IMPLANT
CHANNEL WORK EXTEND EDGE 45 (KITS) IMPLANT
CHANNEL WORK EXTEND EDGE 90 (KITS) IMPLANT
CONT SPEC 4OZ CLIKSEAL STRL BL (MISCELLANEOUS) ×3 IMPLANT
COVER DOME SNAP 22 D (MISCELLANEOUS) ×3 IMPLANT
COVER TABLE BACK 60X90 (DRAPES) ×3 IMPLANT
FILTER STRAW FLUID ASPIR (MISCELLANEOUS) IMPLANT
FORCEPS BIOP RJ4 1.8 (CUTTING FORCEPS) IMPLANT
FORCEPS BIOP SUPERTRX PREMAR (INSTRUMENTS) ×3 IMPLANT
GAUZE SPONGE 4X4 12PLY STRL (GAUZE/BANDAGES/DRESSINGS) ×3 IMPLANT
GLOVE BIO SURGEON STRL SZ 6.5 (GLOVE) ×2 IMPLANT
GLOVE BIO SURGEON STRL SZ7.5 (GLOVE) ×3 IMPLANT
GLOVE BIO SURGEONS STRL SZ 6.5 (GLOVE) ×1
GOWN STRL REUS W/ TWL LRG LVL3 (GOWN DISPOSABLE) ×2 IMPLANT
GOWN STRL REUS W/TWL LRG LVL3 (GOWN DISPOSABLE) ×4
KIT CLEAN ENDO COMPLIANCE (KITS) ×6 IMPLANT
KIT LOCATABLE GUIDE (CANNULA) IMPLANT
KIT MARKER FIDUCIAL DELIVERY (KITS) IMPLANT
KIT PROCEDURE EDGE 180 (KITS) IMPLANT
KIT PROCEDURE EDGE 45 (KITS) IMPLANT
KIT PROCEDURE EDGE 90 (KITS) IMPLANT
KIT ROOM TURNOVER OR (KITS) ×3 IMPLANT
MARKER SKIN DUAL TIP RULER LAB (MISCELLANEOUS) ×3 IMPLANT
NEEDLE BIOPSY TRANSBRONCH 21G (NEEDLE) IMPLANT
NEEDLE EBUS SONO TIP PENTAX (NEEDLE) ×6 IMPLANT
NEEDLE SONO TIP II EBUS (NEEDLE) IMPLANT
NEEDLE SUPERTRX PREMARK BIOPSY (NEEDLE) ×3 IMPLANT
NS IRRIG 1000ML POUR BTL (IV SOLUTION) ×3 IMPLANT
OIL SILICONE PENTAX (PARTS (SERVICE/REPAIRS)) ×3 IMPLANT
PAD ARMBOARD 7.5X6 YLW CONV (MISCELLANEOUS) ×6 IMPLANT
PATCHES PATIENT (LABEL) ×9 IMPLANT
SYR 20CC LL (SYRINGE) ×6 IMPLANT
SYR 20ML ECCENTRIC (SYRINGE) ×3 IMPLANT
SYR 50ML SLIP (SYRINGE) ×3 IMPLANT
SYR 5ML LUER SLIP (SYRINGE) ×3 IMPLANT
TOWEL OR 17X24 6PK STRL BLUE (TOWEL DISPOSABLE) ×3 IMPLANT
TRAP SPECIMEN MUCOUS 40CC (MISCELLANEOUS) IMPLANT
TUBE CONNECTING 20'X1/4 (TUBING) ×1
TUBE CONNECTING 20X1/4 (TUBING) ×2 IMPLANT

## 2015-05-14 NOTE — Discharge Summary (Signed)
Physician Discharge Summary  Cyerra Yim NWG:[id number] DOB: Feb 13, 1950 DOA: 05/10/2015  PCP: Rogers Blocker, MD  Admit date: 05/10/2015 Discharge date: 05/14/2015  Recommendations for Outpatient Follow-up:  1. Pt will need to follow up with PCP in 1-2 weeks post discharge 2. Pt also needs to see oncologist Dr. Burr Medico, radiation oncology Dr. Lisbeth Renshaw 3. Please obtain BMP to evaluate electrolytes and kidney function 4. Please also check CBC to evaluate Hg and Hct levels 5. We will need to  review the cytology, pathology and microbiology results with the patient when they become available.  6. Please also note that oncology has recommended continuing with Decadron and with instruction that this is to be tapered down per Dr. Lisbeth Renshaw  7. Per oncologist Dr. Burr Medico, will await for bronchoscopy and a tissue biopsy, hopefully we'll have adequate tssue for molecule testing, pt will follow up with Dr. Burr Medico within a week after her biopsy  Discharge Diagnoses:  Principal Problem:   Aphasia Active Problems:   TOBACCO ABUSE   Essential hypertension   Personal history of malignant neoplasm of breast   Abnormal CT scan, head   Pulmonary nodule, right   Malnutrition of moderate degree   Pyrexia   Lung nodule   Mediastinal adenopathy  Discharge Condition: Stable  Diet recommendation: Heart healthy diet discussed in details   History of present illness:  65 year old African-American female with a past medical history of hypertension, hyperlipidemia, remote breast cancer (1992) status post lumpectomy who was being treated for a urinary tract infection recently with ciprofloxacin, who presented with the 7-10 minute episode of difficulty speaking, facial twitching. Patient had an abnormal CT scan of the head. She was admitted for further evaluation. Patient found to have several supratentorial lesions. She was found to have findings concerning for lung cancer.  Hospital Course:  Transient aphasia and facial  droop/metastatic disease - MRI brain reveals multiple subcentimeter supratentorial lesions consistent with metastatic process - Dr. [place] Pink d/w Dr. Burr Medico oncology, Dr. Laurence Ferrari with the interventional radiology, recommend bronchoscopy to get biopsy from one of the mediastinal lymph nodes  Partial seizure Secondary to metastatic brain disease and associated edema Continue Decadron   LAN including mediastinal, hilar, and thoracic inlet EBUS done today 4/13 Outpatient follow up arranged so that we can follow up on biopsy results   Asthmatic Bronchitis Continue PRN albuterol HFA  Weight loss Most likely due to cancer. See above.  Microcytic anemia/vitamin B-12 deficiency No signs of active bleeding  History of essential hypertension Borderline low. Monitor   History of hyperlipidemia Continue simvastatin.  Recent UTI No clear evidence of acute UTI, ABX stopped   DVT Prophylaxis: SCDs  Code Status: Full code  Family Communication: Discussed with the patient  Disposition Plan: Home   Procedures/Studies: Dg Chest 2 View  05/11/2015  CLINICAL DATA:  Acute onset of fever and abdominal discomfort. Initial encounter. EXAM: CHEST  2 VIEW COMPARISON:  Chest radiograph performed 09/24/2012 FINDINGS: A 2.1 cm nodule is noted near the right lung apex. Peribronchial thickening is noted. No pleural effusion or pneumothorax is seen. The heart remains normal in size. No acute osseous abnormalities are seen. Scattered clips are noted at the right axilla. Clips are noted within the right upper quadrant, reflecting prior cholecystectomy. IMPRESSION: 1. 2.1 cm nodule near the right lung apex. CT of the chest would be helpful for further evaluation, when and as deemed clinically appropriate. 2. Peribronchial thickening noted. Electronically Signed   By: Garald Balding M.D.   On: 05/11/2015  00:36   Ct Head Wo Contrast  05/11/2015  CLINICAL DATA:  10 minutes episode of slurred speech today. Onset  at 20:30. EXAM: CT HEAD WITHOUT CONTRAST TECHNIQUE: Contiguous axial images were obtained from the base of the skull through the vertex without intravenous contrast. COMPARISON:  None. FINDINGS: There is extensive hypodensity and sulcal effacement in the right frontal lobe, primarily involving the white matter but some gyral involvement is present. A smaller focus of hypodensity is present in the posterior left temporal lobe, with slight sulcal effacement. Patchy periventricular white matter hypodensity is present in the left frontal lobe. These areas of hypodensity may represent subacute multifocal infarction. However, vasogenic edema associated with cerebral masses is also a consideration. There is no midline shift. Basal cisterns are patent. There is no intracranial hemorrhage. There is no extra-axial fluid collection. No bony abnormality is evident. IMPRESSION: Large area of hypodensity in the right frontal lobe and smaller hypodense focus in the posterior left temporal lobe, both with sulcal effacement but no midline shift. These may represent nonhemorrhagic subacute infarctions, but brain MRI is recommended to exclude cerebral masses. These results were called by telephone at the time of interpretation on 05/11/2015 at 12:44 am to Dr. Leonides Schanz, who verbally acknowledged these results. Electronically Signed   By: Andreas Newport M.D.   On: 05/11/2015 00:51   Ct Chest W Contrast  05/11/2015  CLINICAL DATA:  Suspected brain metastases.  Pulmonary nodule. EXAM: CT CHEST, ABDOMEN, AND PELVIS WITH CONTRAST TECHNIQUE: Multidetector CT imaging of the chest, abdomen and pelvis was performed following the standard protocol during bolus administration of intravenous contrast. CONTRAST:  1 ISOVUE-300 IOPAMIDOL (ISOVUE-300) INJECTION 61% COMPARISON:  None. FINDINGS: CT CHEST THORACIC INLET/BODY WALL: Heterogeneously enhancing right thoracic node measuring 17 mm. Changes of right axillary dissection for breast cancer. No  axillary adenopathy. MEDIASTINUM: Right bronchial, hilar, and bilateral mediastinal adenopathy (bilateral paratracheal). The right paratracheal node is heterogeneously enhancing and measures 27 mm in short axis. Normal heart size. No pericardial effusion. Atherosclerotic calcification, including at the coronary arteries. LUNG WINDOWS: Right upper lobe peripheral pulmonary nodule measuring up to 23 mm. This is likely a primary bronchogenic carcinoma. Background emphysema. No definitive pulmonary metastasis. A 2 mm left lower lobe pulmonary nodule 3:33 will be seen on surveillance imaging. OSSEOUS: No suspicious lytic or blastic lesions. CT ABDOMEN AND PELVIS Abdominal wall:  No contributory findings. Hepatobiliary: No evidence of hepatic metastasis.Cholecystectomy. Pancreas: Unremarkable. Spleen:  Granulomatous changes. Adrenals/Urinary Tract: Only the right adrenal gland is seen/remains. No evidence of metastasis. Left nephrectomy with compensatory hypertrophy of the right kidney. Simple appearing right renal cyst. Unremarkable bladder. Reproductive:Hysterectomy and possible left oophorectomy. Negative right adnexa. Stomach/Bowel:  No obstruction. No appendicitis. Vascular/Lymphatic: No acute vascular abnormality. No mass or adenopathy. Peritoneal: No ascites or pneumoperitoneum. Musculoskeletal: No acute abnormalities. IMPRESSION: 1. Right upper lobe nodule with right hilar, bilateral mediastinal, and thoracic inlet adenopathy. 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 2. History of breast cancer with right axillary dissection. 3. Left nephrectomy for uncertain indication. Electronically Signed   By: Monte Fantasia M.D.   On: 05/11/2015 15:29   Mr Jeri Cos XW Contrast  05/11/2015  CLINICAL DATA:  Brief episode of difficulty speaking, facial twitching, drooling LEFT facial droop. Low-grade fever and body aches this week. On treatment for urinary tract infection. Assess for med  passed a cyst. EXAM: MRI HEAD WITHOUT AND WITH CONTRAST TECHNIQUE: Multiplanar, multiecho pulse sequences of the brain and surrounding structures were obtained  without and with intravenous contrast. CONTRAST:  45m MULTIHANCE GADOBENATE DIMEGLUMINE 529 MG/ML IV SOLN COMPARISON:  CT head May 11, 2015 at 0013 hours FINDINGS: Homogeneously enhancing bright T2 subcentimeter nodules in the supratentorial brain with surrounding T2 bright FLAIR T2 hyperintense vasogenic edema consistent with metastasis as follows: 3 mm LEFT temporal occipital lobe (axial 23/44 postcontrast). 4 mm RIGHT mesial frontal lobe cortical lesion (axial 29/44 postcontrast). 6 mm round nodule RIGHT posterior frontal lobe cortex (axial 31/44 postcontrast). 2 mm RIGHT frontal and 6 mm RIGHT frontal convexity cortex (axial 37/44 postcontrast). 3 mm RIGHT frontal convexity cortex (axial 40/44 postcontrast. Faint linear enhancing nodule versus vessel within the LEFT frontal cortex/insula (coronal 20/28 postcontrast). No reduced diffusion to suggest acute ischemia or hypercellular tumor. No susceptibility artifact to suggest hemorrhage. Ventricles and sulci are normal for patient's age. Patchy supratentorial white matter FLAIR T2 hyperintensities exclusive of the aforementioned abnormality compatible with moderate chronic small vessel ischemic disease. No midline shift. No abnormal extra-axial fluid collections, dural or leptomeningeal enhancement. No extra-axial masses. Normal major intracranial vascular flow voids seen at the skull base though, dolichoectasia noted which can be seen with chronic hypertension. Ocular globes and orbital contents are normal. Paranasal sinuses and mastoid air cells are well aerated. Convex margin of the pituitary gland without sellar expansion. No cerebellar tonsillar ectopia. No suspicious calvarial bone marrow signal. IMPRESSION: At least 6 subcentimeter supratentorial predominately cortical based enhancing nodules  compatible with nonhemorrhagic metastatic disease. Local edema without significant mass effect. Moderate chronic small vessel ischemic disease without acute ischemia. Electronically Signed   By: CElon AlasM.D.   On: 05/11/2015 04:16   Ct Abdomen Pelvis W Contrast  05/11/2015  CLINICAL DATA:  Suspected brain metastases.  Pulmonary nodule. EXAM: CT CHEST, ABDOMEN, AND PELVIS WITH CONTRAST TECHNIQUE: Multidetector CT imaging of the chest, abdomen and pelvis was performed following the standard protocol during bolus administration of intravenous contrast. CONTRAST:  1 ISOVUE-300 IOPAMIDOL (ISOVUE-300) INJECTION 61% COMPARISON:  None. FINDINGS: CT CHEST THORACIC INLET/BODY WALL: Heterogeneously enhancing right thoracic node measuring 17 mm. Changes of right axillary dissection for breast cancer. No axillary adenopathy. MEDIASTINUM: Right bronchial, hilar, and bilateral mediastinal adenopathy (bilateral paratracheal). The right paratracheal node is heterogeneously enhancing and measures 27 mm in short axis. Normal heart size. No pericardial effusion. Atherosclerotic calcification, including at the coronary arteries. LUNG WINDOWS: Right upper lobe peripheral pulmonary nodule measuring up to 23 mm. This is likely a primary bronchogenic carcinoma. Background emphysema. No definitive pulmonary metastasis. A 2 mm left lower lobe pulmonary nodule 3:33 will be seen on surveillance imaging. OSSEOUS: No suspicious lytic or blastic lesions. CT ABDOMEN AND PELVIS Abdominal wall:  No contributory findings. Hepatobiliary: No evidence of hepatic metastasis.Cholecystectomy. Pancreas: Unremarkable. Spleen:  Granulomatous changes. Adrenals/Urinary Tract: Only the right adrenal gland is seen/remains. No evidence of metastasis. Left nephrectomy with compensatory hypertrophy of the right kidney. Simple appearing right renal cyst. Unremarkable bladder. Reproductive:Hysterectomy and possible left oophorectomy. Negative right adnexa.  Stomach/Bowel:  No obstruction. No appendicitis. Vascular/Lymphatic: No acute vascular abnormality. No mass or adenopathy. Peritoneal: No ascites or pneumoperitoneum. Musculoskeletal: No acute abnormalities. IMPRESSION: 1. Right upper lobe nodule with right hilar, bilateral mediastinal, and thoracic inlet adenopathy. 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 2. History of breast cancer with right axillary dissection. 3. Left nephrectomy for uncertain indication. Electronically Signed   By: JMonte FantasiaM.D.   On: 05/11/2015 15:29   Dg Chest Port 1 View  05/14/2015  CLINICAL DATA:  Status post bronchoscopy with biopsy. EXAM: PORTABLE CHEST 1 VIEW COMPARISON:  CT scan of May 11, 2015. Radiograph of May 10, 2015. FINDINGS: No pneumothorax is seen. No pleural effusion is noted. Cardiomediastinal silhouette is within normal limits. Atherosclerosis of thoracic aorta is noted. Left lung is clear. Right upper lobe lung mass is again noted. Surgical clips are noted in right axillary region. IMPRESSION: No pneumothorax seen status post bronchoscopy with biopsy. Electronically Signed   By: Marijo Conception, M.D.   On: 05/14/2015 15:16   Dg C-arm Bronchoscopy  05/14/2015  CLINICAL DATA:  C-ARM BRONCHOSCOPY Fluoroscopy was utilized by the requesting physician.  No radiographic interpretation.    Discharge Exam: Filed Vitals:   05/14/15 1445 05/14/15 1505  BP: 141/114 130/60  Pulse: 86 77  Temp: 97.8 F (36.6 C) 98 F (36.7 C)  Resp: 27 27   Filed Vitals:   05/14/15 0554 05/14/15 0911 05/14/15 1445 05/14/15 1505  BP: 118/72 114/61 141/114 130/60  Pulse: 74 75 86 77  Temp: 97.8 F (36.6 C) 97.9 F (36.6 C) 97.8 F (36.6 C) 98 F (36.7 C)  TempSrc: Oral Oral    Resp: '18 18 27 27  '$ Height:      Weight:      SpO2: 100% 98% 100% 100%    General: Pt is alert, follows commands appropriately, not in acute distress Cardiovascular: Regular rate and rhythm, S1/S2 +,  no murmurs, no rubs, no gallops Respiratory: Clear to auscultation bilaterally, no wheezing, no crackles, no rhonchi Abdominal: Soft, non tender, non distended, bowel sounds +, no guarding   Discharge Instructions  Discharge Instructions    Diet - low sodium heart healthy    Complete by:  As directed      Increase activity slowly    Complete by:  As directed             Medication List    STOP taking these medications        ciprofloxacin 250 MG tablet  Commonly known as:  CIPRO     losartan-hydrochlorothiazide 50-12.5 MG tablet  Commonly known as:  HYZAAR      TAKE these medications        albuterol 108 (90 Base) MCG/ACT inhaler  Commonly known as:  PROVENTIL HFA;VENTOLIN HFA  Inhale 1-2 puffs into the lungs every 6 (six) hours as needed for wheezing.     albuterol (2.5 MG/3ML) 0.083% nebulizer solution  Commonly known as:  PROVENTIL  Take 3 mLs (2.5 mg total) by nebulization every 6 (six) hours as needed for wheezing.     atenolol 50 MG tablet  Commonly known as:  TENORMIN  Take 50 mg by mouth daily.     dexamethasone 4 MG tablet  Commonly known as:  DECADRON  Take 1 tablet (4 mg total) by mouth 4 (four) times daily. To be tapered down by Dr. Lisbeth Renshaw     fluticasone 50 MCG/ACT nasal spray  Commonly known as:  FLONASE  Place 1 spray into both nostrils daily.     levETIRAcetam 500 MG tablet  Commonly known as:  KEPPRA  Take 1 tablet (500 mg total) by mouth 2 (two) times daily.     montelukast 10 MG tablet  Commonly known as:  SINGULAIR  Take 10 mg by mouth daily.     pravastatin 20 MG tablet  Commonly known as:  PRAVACHOL  Take 20 mg by mouth daily.     RESTASIS 0.05 % ophthalmic emulsion  Generic drug:  cycloSPORINE  Place 1 drop into both eyes 2 (two) times daily.           Follow-up Information    Follow up with Rogers Blocker, MD.   Specialty:  Internal Medicine   Contact information:   [address] [place]  [address] [phone]       Call Faye Ramsay, MD.   Specialty:  Internal Medicine   Why:   call my cell phone [phone]   Contact information:   [address] [place] [address] [phone]        The results of significant diagnostics from this hospitalization (including imaging, microbiology, ancillary and laboratory) are listed below for reference.     Microbiology: Recent Results (from the past 240 hour(s))  Blood culture (routine x 2)     Status: None (Preliminary result)   Collection Time: 05/11/15  1:05 AM  Result Value Ref Range Status   Specimen Description BLOOD RIGHT HAND  Final   Special Requests BOTTLES DRAWN AEROBIC AND ANAEROBIC 5CC  Final   Culture NO GROWTH 3 DAYS  Final   Report Status PENDING  Incomplete  Urine culture     Status: None   Collection Time: 05/11/15  1:17 AM  Result Value Ref Range Status   Specimen Description URINE, RANDOM  Final   Special Requests NONE  Final   Culture NO GROWTH 1 DAY  Final   Report Status 05/12/2015 FINAL  Final  Blood culture (routine x 2)     Status: None (Preliminary result)   Collection Time: 05/11/15  1:45 AM  Result Value Ref Range Status   Specimen Description BLOOD RIGHT ARM  Final   Special Requests IN PEDIATRIC BOTTLE 3CC  Final   Culture NO GROWTH 3 DAYS  Final   Report Status PENDING  Incomplete  MRSA PCR Screening     Status: None   Collection Time: 05/14/15  5:43 AM  Result Value Ref Range Status   MRSA by PCR NEGATIVE NEGATIVE Final    Comment:        The GeneXpert MRSA Assay (FDA approved for NASAL specimens only), is one component of a comprehensive MRSA colonization surveillance program. It is not intended to diagnose MRSA infection nor to guide or monitor treatment for MRSA infections.      Labs: Basic Metabolic Panel:  Recent Labs Lab 05/10/15 2305 05/10/15 2313 05/12/15 0334 05/13/15 0557 05/14/15 0359  NA 135 136 136 139 138  K 4.4 4.3 4.2  4.8 4.8  CL 101 101 102 104 103  CO2 23  --  '23 25 26  '$ GLUCOSE 114* 107* 146* 138* 132*  BUN '14 19 8 14 15  '$ CREATININE 0.89 0.80 0.65 0.69 0.72  CALCIUM 9.5  --  9.7 9.5 9.5   Liver Function Tests:  Recent Labs Lab 05/10/15 2305  AST 17  ALT 10*  ALKPHOS 68  BILITOT 0.7  PROT 7.5  ALBUMIN 2.6*   No results for input(s): LIPASE, AMYLASE in the last 168 hours. No results for input(s): AMMONIA in the last 168 hours. CBC:  Recent Labs Lab 05/10/15 2305 05/10/15 2313 05/12/15 0334 05/13/15 0557 05/14/15 0359  WBC 7.6  --  4.7 12.9* 12.1*  NEUTROABS 5.0  --   --   --   --   HGB 8.0* 9.2* 7.8* 8.3* 8.0*  HCT 25.2* 27.0* 25.2* 25.3* 26.3*  MCV 77.5*  --  78.0 78.1 78.5  PLT 466*  --  484* 503* 505*     SIGNED: Time coordinating discharge: 30 minutes  MAGICK-Lillien Petronio, MD  [facility] 05/14/2015, 3:53 PM Pager [phone]  If 7PM-7AM, please contact night-coverage [email or web address] Password [email or web address]

## 2015-05-14 NOTE — Progress Notes (Signed)
D/C orders received, pt for D/C home today.  IV and telemetry D/C.  Rx and D/C instructions given with verbalized understanding.  Family at bedside to assist with D/C.  Staff brought pt downstairs via wheelchair.  

## 2015-05-14 NOTE — Transfer of Care (Signed)
Immediate Anesthesia Transfer of Care Note  Patient: Rose [name]  Procedure(s) Performed: Procedure(s): VIDEO BRONCHOSCOPY WITH ENDOBRONCHIAL NAVIGATION (N/A) VIDEO BRONCHOSCOPY WITH ENDOBRONCHIAL ULTRASOUND (N/A)  Patient Location: PACU  Anesthesia Type:General  Level of Consciousness: awake, alert , oriented and patient cooperative  Airway & Oxygen Therapy: Patient Spontanous Breathing and Patient connected to nasal cannula oxygen  Post-op Assessment: Report given to RN, Post -op Vital signs reviewed and stable and Patient moving all extremities  Post vital signs: Reviewed and stable  Last Vitals:  Filed Vitals:   05/14/15 0554 05/14/15 0911  BP: 118/72 114/61  Pulse: 74 75  Temp: 36.6 C 36.6 C  Resp: 18 18    Complications: No apparent anesthesia complications

## 2015-05-14 NOTE — Anesthesia Preprocedure Evaluation (Addendum)
Anesthesia Evaluation  Patient identified by MRN, date of birth, ID band Patient awake    Reviewed: Allergy & Precautions, NPO status , Patient's Chart, lab work & pertinent test results  Airway Mallampati: II  TM Distance: >3 FB Neck ROM: Full    Dental   Pulmonary asthma , COPD, Current Smoker,    breath sounds clear to auscultation       Cardiovascular hypertension,  Rhythm:Regular Rate:Normal     Neuro/Psych  Headaches, Seizures -,  TIA   GI/Hepatic Neg liver ROS, GERD  ,  Endo/Other  negative endocrine ROS  Renal/GU negative Renal ROS     Musculoskeletal   Abdominal   Peds  Hematology   Anesthesia Other Findings   Reproductive/Obstetrics                           Anesthesia Physical Anesthesia Plan  ASA: III  Anesthesia Plan: General   Post-op Pain Management:    Induction: Intravenous  Airway Management Planned: Oral ETT  Additional Equipment:   Intra-op Plan:   Post-operative Plan: Possible Post-op intubation/ventilation  Informed Consent: I have reviewed the patients History and Physical, chart, labs and discussed the procedure including the risks, benefits and alternatives for the proposed anesthesia with the patient or authorized representative who has indicated his/her understanding and acceptance.   Dental advisory given  Plan Discussed with: CRNA and Anesthesiologist  Anesthesia Plan Comments:         Anesthesia Quick Evaluation

## 2015-05-14 NOTE — Care Management Note (Signed)
Case Management Note  Patient Details  Name: Rose [name] MRN: [id number] Date of Birth: [birth year]  Subjective/Objective:                    Action/Plan: Patient discharging home with self care. No further needs per CM.   Expected Discharge Date:                  Expected Discharge Plan:  Home/Self Care  In-House Referral:     Discharge planning Services     Post Acute Care Choice:    Choice offered to:     DME Arranged:    DME Agency:     HH Arranged:    [place] Agency:     Status of Service:  Completed, signed off  Medicare Important Message Given:    Date Medicare IM Given:    Medicare IM give by:    Date Additional Medicare IM Given:    Additional Medicare Important Message give by:     If discussed at [place] of Stay Meetings, dates discussed:    Additional Comments:  Pollie Friar, RN 05/14/2015, 3:56 PM

## 2015-05-14 NOTE — Interval H&P Note (Signed)
PCCM Interval Note  No new issues reported.  Pt understands the procedure, has voiced that she would like to d/c to home after recovery which I believe should be achievable.  No barriers to proceeding identified.  Plan FOB + EBUS and possibly navigation if indicated.    Baltazar Apo, MD, PhD 05/14/2015, 12:21 PM [facility] [phone] or if no answer [phone]

## 2015-05-14 NOTE — Op Note (Signed)
Video Bronchoscopy with Endobronchial Ultrasound and Electromagnetic Navigation Procedure Note  Date of Operation: 05/14/2015  Pre-op Diagnosis: mediastinal lymphadenopathy and right upper lobe nodule  Post-op Diagnosis: same  Surgeon: Baltazar Apo  Assistants: none  Anesthesia: General endotracheal anesthesia  Operation: Flexible video fiberoptic bronchoscopy with endobronchial ultrasound and biopsies.  Estimated Blood Loss: Minimal  Complications: none apparent  Indications and History: Rose [name] is a 65 y.o. female with history of tobacco use and breast cancer. She was admitted to the hospital with neurologic symptoms notably revealed metastatic disease on MRI of the brain. A CT scan of her chest abdomen pelvis revealed mediastinal lymphadenopathy with a right upper lobe nodule. Recommendation was made to seek tissue diagnosis via bronchoscopy.  The risks, benefits, complications, treatment options and expected outcomes were discussed with the patient.  The possibilities of pneumothorax, pneumonia, reaction to medication, pulmonary aspiration, perforation of a viscus, bleeding, failure to diagnose a condition and creating a complication requiring transfusion or operation were discussed with the patient who freely signed the consent.    Description of Procedure: The patient was examined in the preoperative area and history and data from the preprocedure consultation were reviewed. It was deemed appropriate to proceed.  The patient was taken to OR 10, identified as Rose [name] and the procedure verified as Flexible Video Fiberoptic Bronchoscopy.  A Time Out was held and the above information confirmed. After being taken to the operating room general anesthesia was initiated and the patient  was orally intubated. The video fiberoptic bronchoscope was introduced via the endotracheal tube and a general inspection was performed which showed normal airways throughout. The standard scope was  then withdrawn and the endobronchial ultrasound was used to identify and characterize the peritracheal, hilar and bronchial lymph nodes. Inspection showed slight enlargement of the 4L nodes. Also noted was a significantly enlarged station 7 node and an irregular, heterogeneous precarinal 4R node changes consistent with necrosis. Using real-time ultrasound guidance Wang needle biopsies were take from Station 4L, 7 and 4R nodes and were sent for cytology. Initial quick stain information revealed some atypical cells from 4R. Stations 4L and 7 were normal lymphoid tissue. Given these results decision was made to proceed to navigation bronchoscopy to approach the patient's right upper lobe nodule.  Prior to the date of the procedure a high-resolution CT scan of the chest was performed. Utilizing [place] a virtual tracheobronchial tree was generated to allow the creation of distinct navigation pathways to the patient's parenchymal abnormalities. After being taken to the operating room general anesthesia was initiated and the patient  was orally intubated. The extendable working channel and locator guide were introduced into the bronchoscope. The distinct navigation pathways prepared prior to this procedure were then utilized to navigate to within 0.8cm of patient's RUL nodule identified on CT scan. The extendable working channel was secured into place and the locator guide was withdrawn. Under fluoroscopic guidance transbronchial needle brushings, transbronchial Wang needle biopsies, and transbronchial forceps biopsies were performed to be sent for cytology and pathology. A bronchioalveolar lavage was performed in the RUL and sent for cytology and microbiology (bacterial, fungal, AFB smears and cultures). At the end of the procedure a general airway inspection was performed and there was no evidence of active bleeding. The bronchoscope was removed.  The patient tolerated the procedure well. There was no  significant blood loss and there were no obvious complications. A post-procedural chest x-ray showed no evidence of pneumothorax.   Samples: 1. Wang needle biopsies  from 4L node 2. Wang needle biopsies from 7 node 3. Wang needle biopsies from 4R node 4. Transbronchial needle brushings from right upper lobe nodule 5. Transbronchial Wang needle biopsies from right upper lobe nodule 6. Transbronchial forceps biopsies from right upper lobe nodule 7. Bronchoalveolar lavage from right upper lobe  Plans:  The patient will be transferred from the PACU to her hospital room when recovered from anesthesia.  From my perspective she is stable to be discharged from the hospital after recovered from her anesthesia. We will review the cytology, pathology and microbiology results with the patient when they become available.     Baltazar Apo, MD, PhD 05/14/2015, 3:58 PM [facility] [phone] or if no answer [phone]

## 2015-05-14 NOTE — Anesthesia Procedure Notes (Signed)
Procedure Name: Intubation Date/Time: 05/14/2015 12:53 PM Performed by: Myna Bright Pre-anesthesia Checklist: Patient identified, Emergency Drugs available, Suction available and Patient being monitored Patient Re-evaluated:Patient Re-evaluated prior to inductionOxygen Delivery Method: Circle system utilized Preoxygenation: Pre-oxygenation with 100% oxygen Intubation Type: IV induction Ventilation: Mask ventilation without difficulty Laryngoscope Size: Miller and 2 Grade View: Grade I Tube type: Oral Tube size: 8.5 mm Number of attempts: 1 Airway Equipment and Method: Stylet Placement Confirmation: ETT inserted through vocal cords under direct vision,  positive ETCO2 and breath sounds checked- equal and bilateral Secured at: 24 cm Tube secured with: Tape Dental Injury: Teeth and Oropharynx as per pre-operative assessment  Comments: Intubation by Roxanne Mins SRNA

## 2015-05-14 NOTE — H&P (View-Only) (Signed)
Name: Donald Jacque MRN: [id number] DOB: [birth year]    ADMISSION DATE:  05/10/2015 CONSULTATION DATE:  05/12/2015  REFERRING MD :  [place] Pink  CHIEF COMPLAINT:  Brain lesions  BRIEF PATIENT DESCRIPTION: 65 -year-old female with remote history of breast cancer status post lumpectomy in 1992. Found have several supratentorial lesions after presenting with dysphasia and facial twitching. Mediastinal lymphadenopathy also noted, pulmonary consulted for potential bronchoscopic biopsy.  SIGNIFICANT EVENTS    STUDIES:  CT head 4/10 > Large area of hypodensity in the right frontal lobe and smaller hypodense focus in the posterior left temporal lobe, both with sulcal effacement but no midline shift. These may represent nonhemorrhagic subacute infarctions, but brain MRI is recommended to exclude cerebral masses. MRI Brain 4/10 > At least 6 subcentimeter supratentorial predominately cortical based enhancing nodules compatible with nonhemorrhagic metastatic disease. Local edema without significant mass effect. CT abd/pel/chest 4/10 > Right upper lobe nodule with right hilar, bilateral mediastinal, and thoracic inlet adenopathy. 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 History of breast cancer with right axillary dissection. Left nephrectomy for uncertain indication.   HISTORY OF PRESENT ILLNESS:  65 year old female past medical history as below, which includes breast cancer status post lumpectomy in 1992, hypertension, asthmatic bronchitis, seasonal allergies. She is a current every day smoker and smokes about 5-7 cigarettes per day. She has been smoking for upwards of 30 years. She denies any alcohol or illegal substance abuse. She was recently treated with antibiotics for UTI after noting that she would awaken from sleep several times a night to void her bladder. Besides UTI, she was in her usual state of health until 4/9 late p.m. when she developed some facial  twitching on the right side and difficulty speaking. She thought she was having a stroke and attempted to chew aspirin she called 911. Upon the time they had arrived her symptoms have resolved. She presented to the emergency department and underwent CT scan of the head which prompted MRI of the brain, which demonstrated at least 6 subcentimeter supratentorial enhancing nodules concerning for metastatic disease. CT scan of the abdomen and pelvis and chest were performed, and demonstrated lymphadenopathy. Pulmonary consulted for possible bronchoscopic biopsy.  SUBJECTIVE:  No new issues or complaints  VITAL SIGNS: Temp:  [97.9 F (36.6 C)-98.8 F (37.1 C)] 98.8 F (37.1 C) (04/12 1451) Pulse Rate:  [74-98] 74 (04/12 1451) Resp:  [16-20] 16 (04/12 1451) BP: (100-120)/(44-60) 107/46 mmHg (04/12 1451) SpO2:  [99 %-100 %] 100 % (04/12 1451)  PHYSICAL EXAMINATION: General:  Female of normal body habitus in NAD Neuro:  Alert, oriented, nonfocal HEENT:  [place]/AT, PERRL, no JVD Cardiovascular:  RRR, no MRG Lungs:  Intermittent faint expiratory wheeze Abdomen:  Soft, non-tender, non-distended Musculoskeletal:  No acute deformity or ROM limitation Skin:  Grossly intact   Recent Labs Lab 05/10/15 2305 05/10/15 2313 05/12/15 0334 05/13/15 0557  NA 135 136 136 139  K 4.4 4.3 4.2 4.8  CL 101 101 102 104  CO2 23  --  23 25  BUN '14 19 8 14  '$ CREATININE 0.89 0.80 0.65 0.69  GLUCOSE 114* 107* 146* 138*    Recent Labs Lab 05/10/15 2305 05/10/15 2313 05/12/15 0334 05/13/15 0557  HGB 8.0* 9.2* 7.8* 8.3*  HCT 25.2* 27.0* 25.2* 25.3*  WBC 7.6  --  4.7 12.9*  PLT 466*  --  484* 503*   No results found.  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 If no dx achieved on quick staining of the LAD then will proceed  with navigational FOB to the RUL nodule. Scheduled for 12:00 4/13 - procedure discussed with the patient, all questions answered.   Asthmatic Bronchitis - Continue PRN albuterol HFA - fluticasone nasal spray  Baltazar Apo, MD, PhD 05/13/2015, 3:33 PM [facility] [phone] or if no answer [phone]

## 2015-05-14 NOTE — Discharge Instructions (Signed)

## 2015-05-14 NOTE — Anesthesia Postprocedure Evaluation (Signed)
Anesthesia Post Note  Patient: Rose [name]  Procedure(s) Performed: Procedure(s) (LRB): VIDEO BRONCHOSCOPY WITH ENDOBRONCHIAL NAVIGATION (N/A) VIDEO BRONCHOSCOPY WITH ENDOBRONCHIAL ULTRASOUND (N/A)  Patient location during evaluation: PACU Anesthesia Type: General Level of consciousness: awake Pain management: pain level controlled Vital Signs Assessment: post-procedure vital signs reviewed and stable Respiratory status: spontaneous breathing Cardiovascular status: stable Anesthetic complications: no    Last Vitals:  Filed Vitals:   05/14/15 1445 05/14/15 1505  BP: 141/114 130/60  Pulse: 86 77  Temp: 36.6 C 36.7 C  Resp: 27 27    Last Pain:  Filed Vitals:   05/14/15 1538  PainSc: 0-No pain    LLE Motor Response: Purposeful movement (05/14/15 1535) LLE Sensation: Full sensation (05/14/15 1535) RLE Motor Response: Purposeful movement (05/14/15 1535) RLE Sensation: Full sensation (05/14/15 1535)      EDWARDS,Ethelene Closser

## 2015-05-15 ENCOUNTER — Telehealth: Payer: Self-pay | Admitting: Radiation Oncology

## 2015-05-15 ENCOUNTER — Telehealth: Payer: Self-pay | Admitting: Hematology

## 2015-05-15 DIAGNOSIS — C3491 Malignant neoplasm of unspecified part of right bronchus or lung: Secondary | ICD-10-CM

## 2015-05-15 LAB — ACID FAST SMEAR (AFB, MYCOBACTERIA): Acid Fast Smear: NEGATIVE

## 2015-05-15 LAB — ACID FAST SMEAR (AFB)

## 2015-05-15 NOTE — Telephone Encounter (Signed)
Spoke with patient re hosp fu appointment with YF 05/20/15 @ 12:30 pm to arrive at 12 pm. Appointment schedule due to staff message from [place].

## 2015-05-15 NOTE — Telephone Encounter (Signed)
Spoke with the patient to review the initial path is negative from FNA but that brushings are still pending. We may postpone simulation if we don't know histology by Wednesday. She also is in need of PET for staging, and I have discussed this with her. Orders will be placed and scheduling notified.

## 2015-05-16 ENCOUNTER — Other Ambulatory Visit: Payer: Self-pay | Admitting: Radiation Oncology

## 2015-05-16 DIAGNOSIS — C3491 Malignant neoplasm of unspecified part of right bronchus or lung: Secondary | ICD-10-CM

## 2015-05-16 LAB — CULTURE, BLOOD (ROUTINE X 2)
CULTURE: NO GROWTH
Culture: NO GROWTH

## 2015-05-17 LAB — CULTURE, RESPIRATORY W GRAM STAIN: Gram Stain: NONE SEEN

## 2015-05-17 LAB — CULTURE, RESPIRATORY: CULTURE: NO GROWTH

## 2015-05-18 ENCOUNTER — Telehealth: Payer: Self-pay | Admitting: *Deleted

## 2015-05-18 ENCOUNTER — Telehealth: Payer: Self-pay | Admitting: Radiation Oncology

## 2015-05-18 NOTE — Telephone Encounter (Signed)
I spoke with the patient to review the cytology confirmed NSCLC c/w adenocarcinoma. She will move forward with T3MRI, simulation and probable visit with neurosurgery to move forward with SRS if she's still a candidate based on T3MRI.

## 2015-05-18 NOTE — Telephone Encounter (Signed)
CALLED PATIENT TO INFORM OF PET SCAN ON 05-20-15 @ 7:30 AM , NPO 6 HRS. PRIOR TO TEST @ [facility] (RADIOLOGY), LVM FOR A RETURN CALL

## 2015-05-19 ENCOUNTER — Encounter (HOSPITAL_COMMUNITY): Payer: Self-pay | Admitting: Emergency Medicine

## 2015-05-20 ENCOUNTER — Ambulatory Visit: Payer: BLUE CROSS/BLUE SHIELD | Admitting: Radiation Oncology

## 2015-05-20 ENCOUNTER — Telehealth: Payer: Self-pay | Admitting: *Deleted

## 2015-05-20 ENCOUNTER — Ambulatory Visit: Admission: RE | Admit: 2015-05-20 | Payer: BLUE CROSS/BLUE SHIELD | Source: Ambulatory Visit

## 2015-05-20 ENCOUNTER — Encounter: Payer: Self-pay | Admitting: Skilled Nursing Facility1

## 2015-05-20 ENCOUNTER — Encounter: Payer: BLUE CROSS/BLUE SHIELD | Admitting: Hematology

## 2015-05-20 ENCOUNTER — Encounter: Payer: Self-pay | Admitting: Hematology

## 2015-05-20 NOTE — Telephone Encounter (Signed)
Called patient to inform of Pet Scan for 05-29-15- arrival time - 6:30 am, NPO  After midnight, spoke with patient and she is good with this appt.

## 2015-05-20 NOTE — Progress Notes (Signed)
No show  This encounter was created in error - please disregard.

## 2015-05-20 NOTE — Progress Notes (Signed)
Subjective:     Patient ID: Rose [name], female   DOB: 06-11-1950, 65 y.o.   MRN: [id number]  HPI   Review of Systems     Objective:   Physical Exam To assist the pt in identifying dietary strategies to gain lost wt.    Assessment:     Pt identified as being malnourished due to lost wt. Pt contacted via the telephone at [phone].    Plan:     Dietitian left a  Message prompting the pt to contact [place], RD,LDN at [phone].

## 2015-05-21 ENCOUNTER — Ambulatory Visit
Admission: RE | Admit: 2015-05-21 | Discharge: 2015-05-21 | Disposition: A | Discharge status: Home / Self Care | Payer: BLUE CROSS/BLUE SHIELD | Source: Ambulatory Visit | Attending: Radiation Oncology | Admitting: Radiation Oncology

## 2015-05-21 DIAGNOSIS — C3491 Malignant neoplasm of unspecified part of right bronchus or lung: Secondary | ICD-10-CM

## 2015-05-21 MED ORDER — GADOBENATE DIMEGLUMINE 529 MG/ML IV SOLN
10.0000 mL | Freq: Once | INTRAVENOUS | Status: AC | PRN
  Administered 2015-05-21: 10 mL via INTRAVENOUS

## 2015-05-22 ENCOUNTER — Ambulatory Visit
Admission: RE | Admit: 2015-05-22 | Payer: BLUE CROSS/BLUE SHIELD | Source: Ambulatory Visit | Admitting: Radiation Oncology

## 2015-05-22 ENCOUNTER — Other Ambulatory Visit: Payer: BLUE CROSS/BLUE SHIELD

## 2015-05-22 ENCOUNTER — Telehealth: Payer: Self-pay | Admitting: Radiation Therapy

## 2015-05-22 ENCOUNTER — Ambulatory Visit: Admission: RE | Admit: 2015-05-22 | Payer: BLUE CROSS/BLUE SHIELD | Source: Ambulatory Visit

## 2015-05-22 NOTE — Telephone Encounter (Signed)
Called pt to inform her that the sim appointment from today has been rescheduled to 4/26. I left my information and requested a return call to talk about her consultation with Dr. Sherwood Gambler on 4/28 and her treatment on 5/1.  Mont Dutton R.T.(R)(T) Special Procedures Navigator

## 2015-05-26 ENCOUNTER — Other Ambulatory Visit: Payer: Self-pay | Admitting: Hematology

## 2015-05-26 ENCOUNTER — Telehealth: Payer: Self-pay | Admitting: Hematology

## 2015-05-26 DIAGNOSIS — C3491 Malignant neoplasm of unspecified part of right bronchus or lung: Secondary | ICD-10-CM

## 2015-05-26 NOTE — Telephone Encounter (Signed)
per pof to sch pt appt-cld & spoke to pt and adv of time & date of 5/8 appt '@1'$ :30

## 2015-05-27 ENCOUNTER — Ambulatory Visit
Admission: RE | Admit: 2015-05-27 | Discharge: 2015-05-27 | Disposition: A | Discharge status: Home / Self Care | Payer: BLUE CROSS/BLUE SHIELD | Source: Ambulatory Visit | Attending: Radiation Oncology | Admitting: Radiation Oncology

## 2015-05-27 ENCOUNTER — Ambulatory Visit
Admission: RE | Admit: 2015-05-27 | Discharge: 2015-05-27 | Disposition: A | Discharge status: Home / Self Care | Payer: BLUE CROSS/BLUE SHIELD | Source: Ambulatory Visit

## 2015-05-27 DIAGNOSIS — R590 Localized enlarged lymph nodes: Secondary | ICD-10-CM | POA: Diagnosis not present

## 2015-05-27 DIAGNOSIS — Z853 Personal history of malignant neoplasm of breast: Secondary | ICD-10-CM | POA: Diagnosis present

## 2015-05-27 DIAGNOSIS — C7931 Secondary malignant neoplasm of brain: Secondary | ICD-10-CM

## 2015-05-27 DIAGNOSIS — C3491 Malignant neoplasm of unspecified part of right bronchus or lung: Secondary | ICD-10-CM | POA: Diagnosis present

## 2015-05-27 DIAGNOSIS — Z510 Encounter for antineoplastic radiation therapy: Secondary | ICD-10-CM | POA: Diagnosis present

## 2015-05-27 DIAGNOSIS — G939 Disorder of brain, unspecified: Secondary | ICD-10-CM | POA: Diagnosis not present

## 2015-05-27 DIAGNOSIS — I10 Essential (primary) hypertension: Secondary | ICD-10-CM | POA: Diagnosis not present

## 2015-05-27 DIAGNOSIS — F17210 Nicotine dependence, cigarettes, uncomplicated: Secondary | ICD-10-CM | POA: Diagnosis not present

## 2015-05-27 DIAGNOSIS — C7949 Secondary malignant neoplasm of other parts of nervous system: Principal | ICD-10-CM

## 2015-05-27 HISTORY — DX: Malignant neoplasm of brain, unspecified: C71.9

## 2015-05-27 HISTORY — DX: Malignant neoplasm of unspecified site of unspecified female breast: C50.919

## 2015-05-27 NOTE — Progress Notes (Signed)
Patient   assessed for Ct si ulation for brain,SRS, patient denies, pain, nausea, vison changes, or pain, no dizzy ness or light headiness, appetite improving, but orthostaic,, took in both arms, energy level better BP 82/42 mmHg  Pulse 83  Temp(Src) 98.3 F (36.8 C) (Oral)  Resp 16  Ht '5\' 1"'$  (1.549 m)  Wt 117 lb 4.8 oz (53.207 kg)  BMI 22.18 kg/m2 right arm BP 81/39 mmHg  Pulse 85  Temp(Src) 98.3 F (36.8 C) (Oral)  Resp 16  Ht '5\' 1"'$  (1.549 m)  Wt 117 lb 4.8 oz (53.207 kg)  BMI 22.18 kg/m2 left arm On bloodpressure medications daily and has taken them today,  Wt Readings from Last 3 Encounters:  05/27/15 117 lb 4.8 oz (53.207 kg)  05/10/15 121 lb (54.885 kg)  11/05/13 141 lb (63.957 kg)  stopped taking decadron, stated it gave her mouth sores, tongue looks like yellowish on back sides of tongue, not taking keppra either stated she didn't have rx,   Pet scan scheduled 05/29/15 CT BX 06/02/15 Not diabetic, no IV dye allergy stated, labs good  05/14/15: BUN=15, CR=0.72, GFR=72,  Patient was informed to contact her Primary MD about her low B/P, her medication may need to be changed,  Please check it before taking any more b/p meds,  Samantha Presnell RN  Inserted, IV RAC #22g 1 inch catheter needle  X 1 attempt, blood return excellent, flushed with 38m ns, patient tolerated well, Ct sim aware patient   Is ready 2:52 PM

## 2015-05-29 ENCOUNTER — Encounter (HOSPITAL_COMMUNITY): Payer: BLUE CROSS/BLUE SHIELD

## 2015-06-01 ENCOUNTER — Ambulatory Visit: Payer: BLUE CROSS/BLUE SHIELD | Admitting: Radiation Oncology

## 2015-06-01 ENCOUNTER — Other Ambulatory Visit: Payer: Self-pay | Admitting: Radiology

## 2015-06-02 ENCOUNTER — Ambulatory Visit (HOSPITAL_COMMUNITY): Admission: RE | Admit: 2015-06-02 | Payer: BLUE CROSS/BLUE SHIELD | Source: Ambulatory Visit

## 2015-06-02 DIAGNOSIS — Z510 Encounter for antineoplastic radiation therapy: Secondary | ICD-10-CM | POA: Diagnosis not present

## 2015-06-03 ENCOUNTER — Other Ambulatory Visit: Payer: Self-pay | Admitting: Radiology

## 2015-06-03 ENCOUNTER — Ambulatory Visit
Admission: RE | Admit: 2015-06-03 | Discharge: 2015-06-03 | Disposition: A | Discharge status: Home / Self Care | Payer: BLUE CROSS/BLUE SHIELD | Source: Ambulatory Visit | Attending: Radiation Oncology | Admitting: Radiation Oncology

## 2015-06-03 VITALS — BP 116/47 | HR 97 | Temp 98.6°F

## 2015-06-03 DIAGNOSIS — C7931 Secondary malignant neoplasm of brain: Secondary | ICD-10-CM

## 2015-06-03 DIAGNOSIS — Z510 Encounter for antineoplastic radiation therapy: Secondary | ICD-10-CM | POA: Diagnosis not present

## 2015-06-03 NOTE — Progress Notes (Signed)
Patient continues to deny having pain, headache, nausea or vision changes.

## 2015-06-03 NOTE — Op Note (Signed)
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 Kyung [name] (radiation oncology) prior to treatment.  It showed 3-dimensional radiation distributions overlaid onto [name] planning CT/MRI image set.  [name] [facility]: [place] Rose [name] target structures as well as [name] organs at risk were reviewed. [name] documentation of [name] 3D plan and dosimetry are filed in [name] radiation oncology EMR.  NARRATIVE:  Rose [name] was brought to [name] TrueBeam stereotactic radiation treatment machine and placed supine on [name] CT couch. [name] head frame was applied, and [name] patient was set up for stereotactic radiosurgery.  I was present for [name] set-up and delivery.  SIMULATION VERIFICATION:  In [name] couch zero-angle position, [name] patient underwent Exactrac imaging using [name] Brainlab system with orthogonal KV images.  These were carefully aligned and repeated to confirm treatment position for each of [name] isocenters.  [name] Exactrac snap film verification was repeated at each couch angle.  SPECIAL TREATMENT PROCEDURE: Rose [name] received stereotactic radiosurgery to [name] following targets: 6 brain metastases (all subcentimeter in diameter) targets were treated using 21 segmented Dynamic Conformal Arcs using a single isocenter, multitarget treatment plan to a prescription dose of 20 Gy for each metastasis. ExacTrac registration was performed for each couch angle. [name] 100% isodose line was prescribed.  STEREOTACTIC TREATMENT MANAGEMENT:  Following delivery, [name] patient was transported to nursing in stable condition and monitored for possible acute effects.  Vital signs were  recorded. [name] patient tolerated treatment without significant acute effects, and was discharged to home in stable condition.    PLAN: Follow-up in one month.

## 2015-06-03 NOTE — Progress Notes (Signed)
Rose [name] here for post [facility] monitoring.  She denies having pain, headaches, nausea or double vision.  She is alert and oriented to person, place and time.

## 2015-06-04 ENCOUNTER — Telehealth: Payer: Self-pay | Admitting: *Deleted

## 2015-06-04 NOTE — Telephone Encounter (Signed)
xxxx 

## 2015-06-04 NOTE — Telephone Encounter (Signed)
Called patient to inform of Pet Scan on 06-10-15 - arrival time - 9:30 am @ [facility], spoke with patient and she is aware of this test.

## 2015-06-05 ENCOUNTER — Telehealth: Payer: Self-pay | Admitting: Hematology

## 2015-06-05 ENCOUNTER — Encounter (HOSPITAL_COMMUNITY): Payer: Self-pay

## 2015-06-05 ENCOUNTER — Ambulatory Visit (HOSPITAL_COMMUNITY)
Admission: RE | Admit: 2015-06-05 | Discharge: 2015-06-05 | Disposition: A | Discharge status: Home / Self Care | Payer: BLUE CROSS/BLUE SHIELD | Source: Ambulatory Visit | Attending: Hematology | Admitting: Hematology

## 2015-06-05 DIAGNOSIS — R590 Localized enlarged lymph nodes: Secondary | ICD-10-CM | POA: Diagnosis not present

## 2015-06-05 DIAGNOSIS — Z87891 Personal history of nicotine dependence: Secondary | ICD-10-CM | POA: Insufficient documentation

## 2015-06-05 DIAGNOSIS — C7931 Secondary malignant neoplasm of brain: Secondary | ICD-10-CM | POA: Insufficient documentation

## 2015-06-05 DIAGNOSIS — Z803 Family history of malignant neoplasm of breast: Secondary | ICD-10-CM | POA: Diagnosis not present

## 2015-06-05 DIAGNOSIS — R4182 Altered mental status, unspecified: Secondary | ICD-10-CM | POA: Insufficient documentation

## 2015-06-05 DIAGNOSIS — Z7982 Long term (current) use of aspirin: Secondary | ICD-10-CM | POA: Diagnosis not present

## 2015-06-05 DIAGNOSIS — E785 Hyperlipidemia, unspecified: Secondary | ICD-10-CM | POA: Insufficient documentation

## 2015-06-05 DIAGNOSIS — Z800 Family history of malignant neoplasm of digestive organs: Secondary | ICD-10-CM | POA: Diagnosis not present

## 2015-06-05 DIAGNOSIS — Z853 Personal history of malignant neoplasm of breast: Secondary | ICD-10-CM | POA: Diagnosis not present

## 2015-06-05 DIAGNOSIS — I10 Essential (primary) hypertension: Secondary | ICD-10-CM | POA: Diagnosis not present

## 2015-06-05 DIAGNOSIS — C3491 Malignant neoplasm of unspecified part of right bronchus or lung: Secondary | ICD-10-CM

## 2015-06-05 DIAGNOSIS — R918 Other nonspecific abnormal finding of lung field: Secondary | ICD-10-CM | POA: Diagnosis present

## 2015-06-05 DIAGNOSIS — Z79899 Other long term (current) drug therapy: Secondary | ICD-10-CM | POA: Diagnosis not present

## 2015-06-05 DIAGNOSIS — C3411 Malignant neoplasm of upper lobe, right bronchus or lung: Secondary | ICD-10-CM | POA: Diagnosis not present

## 2015-06-05 LAB — CBC
HCT: 29.5 % — ABNORMAL LOW (ref 36.0–46.0)
Hemoglobin: 9.3 g/dL — ABNORMAL LOW (ref 12.0–15.0)
MCH: 25.3 pg — ABNORMAL LOW (ref 26.0–34.0)
MCHC: 31.5 g/dL (ref 30.0–36.0)
MCV: 80.4 fL (ref 78.0–100.0)
PLATELETS: 188 10*3/uL (ref 150–400)
RBC: 3.67 MIL/uL — ABNORMAL LOW (ref 3.87–5.11)
RDW: 18.2 % — AB (ref 11.5–15.5)
WBC: 2.7 10*3/uL — AB (ref 4.0–10.5)

## 2015-06-05 LAB — PROTIME-INR
INR: 1.08 (ref 0.00–1.49)
Prothrombin Time: 14.2 seconds (ref 11.6–15.2)

## 2015-06-05 LAB — APTT: APTT: 39 s — AB (ref 24–37)

## 2015-06-05 MED ORDER — FENTANYL CITRATE (PF) 100 MCG/2ML IJ SOLN
INTRAMUSCULAR | Status: AC
  Filled 2015-06-05: qty 4

## 2015-06-05 MED ORDER — FENTANYL CITRATE (PF) 100 MCG/2ML IJ SOLN
INTRAMUSCULAR | Status: AC | PRN
  Administered 2015-06-05: 50 ug via INTRAVENOUS

## 2015-06-05 MED ORDER — LIDOCAINE HCL 1 % IJ SOLN
INTRAMUSCULAR | Status: AC
  Filled 2015-06-05: qty 20

## 2015-06-05 MED ORDER — MIDAZOLAM HCL 2 MG/2ML IJ SOLN
INTRAMUSCULAR | Status: AC | PRN
  Administered 2015-06-05: 1 mg via INTRAVENOUS

## 2015-06-05 MED ORDER — HYDROCODONE-ACETAMINOPHEN 5-325 MG PO TABS
1.0000 | ORAL_TABLET | Freq: Once | ORAL | Status: AC
  Administered 2015-06-05: 1 via ORAL
  Filled 2015-06-05: qty 2

## 2015-06-05 MED ORDER — HYDROCODONE-ACETAMINOPHEN 5-325 MG PO TABS
ORAL_TABLET | ORAL | Status: AC
  Administered 2015-06-05: 1 via ORAL
  Filled 2015-06-05: qty 1

## 2015-06-05 MED ORDER — MIDAZOLAM HCL 2 MG/2ML IJ SOLN
INTRAMUSCULAR | Status: AC
  Filled 2015-06-05: qty 4

## 2015-06-05 MED ORDER — HYDROCODONE-ACETAMINOPHEN 5-325 MG PO TABS
1.0000 | ORAL_TABLET | ORAL | Status: DC | PRN
  Filled 2015-06-05: qty 2

## 2015-06-05 MED ORDER — SODIUM CHLORIDE 0.9 % IV SOLN: Freq: Once | INTRAVENOUS | Status: DC

## 2015-06-05 NOTE — Telephone Encounter (Signed)
per staff message from Dr Burr Medico to call pt to remind of MD appt on 5/8'@3'$ :30

## 2015-06-05 NOTE — H&P (Signed)
Chief Complaint: Patient was seen in consultation today for right lung mass biopsy at the request of Feng,Yan  Referring Physician(s): Feng,Yan  Supervising Physician: Aletta Edouard  Patient Status: Out-pt  History of Present Illness: Rose [name] is a 65 y.o. female   Hx Breast Ca 1992 +smoker New onset AMS; facial twitching; drooling 05/10/2015 Work up revealed brain metastasis Rt lung mass and hilar/mediastinal Lymphadenopathy CT Chest 05/11/15: IMPRESSION: 1. Right upper lobe nodule with right hilar, bilateral mediastinal, and thoracic inlet adenopathy. 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 2. History of breast cancer with right axillary dissection. 3. Left nephrectomy for uncertain indication.  MR 05/21/15: IMPRESSION: 1. Re- demonstration of 6 small enhancing supratentorial brain metastases. These are annotated on series 11. Mildly regressed cerebral edema. No significant intracranial mass effect. 2. No new metastasis or new intracranial abnormality identified.  Bronchoscopy and bx of Rt lung mass 05/14/15: + non small cell carcinoma Not sufficient for further testing Now scheduled for tissue sampling for molecular tests per Dr Burr Medico   Past Medical History  Diagnosis Date  . Hypertension   . Hyperlipidemia   . Family history of adverse reaction to anesthesia     "my son passed away I think related to the anesthesia"  . Asthmatic bronchitis   . Chronic bronchitis ([place])   . Sinus headache   . Seizures ([place]) 05/10/2015    "it may have been related to me taking Cipro" (05/11/2015)  . Brain cancer ([place])   . Breast cancer [facility])     Past Surgical History  Procedure Laterality Date  . Appendectomy    . Abdominal hysterectomy    . Laparoscopic cholecystectomy    . Breast biopsy Right   . Breast lumpectomy Right   . Video bronchoscopy with endobronchial navigation N/A 05/14/2015    Procedure: VIDEO BRONCHOSCOPY WITH  ENDOBRONCHIAL NAVIGATION;  Surgeon: Collene Gobble, MD;  Location: [place];  Service: Thoracic;  Laterality: N/A;  . Video bronchoscopy with endobronchial ultrasound N/A 05/14/2015    Procedure: VIDEO BRONCHOSCOPY WITH ENDOBRONCHIAL ULTRASOUND;  Surgeon: Collene Gobble, MD;  Location: [facility];  Service: Thoracic;  Laterality: N/A;    Allergies: Review of patient's allergies indicates no known allergies.  Medications: Prior to Admission medications   Medication Sig Start Date End Date Taking? Authorizing Provider  aspirin EC 81 MG tablet Take 81 mg by mouth daily.   Yes Historical Provider, MD  atenolol (TENORMIN) 50 MG tablet Take 50 mg by mouth daily.   Yes Historical Provider, MD  cholecalciferol (VITAMIN D) 1000 units tablet Take 1,000 Units by mouth daily.   Yes Historical Provider, MD  lactobacillus acidophilus (BACID) TABS tablet Take 2 tablets by mouth daily.   Yes Historical Provider, MD  losartan-hydrochlorothiazide (HYZAAR) 50-12.5 MG tablet Take 1 tablet by mouth daily.   Yes Historical Provider, MD  montelukast (SINGULAIR) 10 MG tablet Take 10 mg by mouth daily. Reported on 05/27/2015 05/10/15  Yes Historical Provider, MD  pravastatin (PRAVACHOL) 20 MG tablet Take 20 mg by mouth daily.   Yes Historical Provider, MD  RESTASIS 0.05 % ophthalmic emulsion Place 1 drop into both eyes 2 (two) times daily. 03/12/15  Yes Historical Provider, MD  albuterol (PROVENTIL HFA;VENTOLIN HFA) 108 (90 BASE) MCG/ACT inhaler Inhale 1-2 puffs into the lungs every 6 (six) hours as needed for wheezing. Patient not taking: Reported on 05/27/2015 09/24/12   Adlih Moreno-Coll, MD  albuterol (PROVENTIL) (2.5 MG/3ML) 0.083% nebulizer solution Take  3 mLs (2.5 mg total) by nebulization every 6 (six) hours as needed for wheezing. Patient not taking: Reported on 05/27/2015 05/14/15   Theodis Blaze, MD  dexamethasone (DECADRON) 4 MG tablet Take 1 tablet (4 mg total) by mouth 4 (four) times daily. To be tapered down by Dr.  Lisbeth Renshaw Patient not taking: Reported on 05/27/2015 05/14/15   Theodis Blaze, MD  levETIRAcetam (KEPPRA) 500 MG tablet Take 1 tablet (500 mg total) by mouth 2 (two) times daily. Patient not taking: Reported on 05/27/2015 05/14/15   Theodis Blaze, MD     Family History  Problem Relation Age of Onset  . Breast cancer Mother 44  . Gastric cancer Daughter 44    Social History   Social History  . Marital Status: Divorced    Spouse Name: N/A  . Number of Children: N/A  . Years of Education: N/A   Social History Main Topics  . Smoking status: Former Smoker -- 0.50 packs/day for 36 years    Types: Cigarettes  . Smokeless tobacco: Never Used     Comment: quit 2 weeks ago 05/13/15  . Alcohol Use: No  . Drug Use: No  . Sexual Activity: Yes    Birth Control/ Protection: Surgical   Other Topics Concern  . None   Social History Narrative    Review of Systems: A 12 point ROS discussed and pertinent positives are indicated in the HPI above.  All other systems are negative.  Review of Systems  Constitutional: Positive for activity change, fatigue and unexpected weight change. Negative for fever and appetite change.  HENT: Negative for hearing loss, tinnitus, trouble swallowing and voice change.   Eyes: Negative for visual disturbance.  Respiratory: Negative for shortness of breath.   Gastrointestinal: Negative for abdominal pain.  Musculoskeletal: Negative for back pain.  Neurological: Positive for weakness. Negative for dizziness, tremors, seizures, syncope, facial asymmetry, speech difficulty, light-headedness, numbness and headaches.  Psychiatric/Behavioral: Negative for behavioral problems, confusion and decreased concentration.    Vital Signs: BP 111/77 mmHg  Pulse 94  Temp(Src) 98 F (36.7 C) (Oral)  Resp 16  Ht '5\' 1"'$  (1.549 m)  Wt 115 lb (52.164 kg)  BMI 21.74 kg/m2  SpO2 99%  Physical Exam  Constitutional: She is oriented to person, place, and time.  Cardiovascular:  Normal rate, regular rhythm and normal heart sounds.   Pulmonary/Chest: Effort normal. She has no wheezes.  Abdominal: Soft. Bowel sounds are normal. There is no tenderness.  Musculoskeletal: Normal range of motion.  Neurological: She is alert and oriented to person, place, and time.  Skin: Skin is warm and dry.  Psychiatric: She has a normal mood and affect. Her behavior is normal. Judgment and thought content normal.  Nursing note and vitals reviewed.   Mallampati Score:  MD Evaluation Airway: WNL Heart: WNL Abdomen: WNL Chest/ Lungs: WNL ASA  Classification: 3 Mallampati/Airway Score: One  Imaging: Dg Chest 2 View  05/11/2015  CLINICAL DATA:  Acute onset of fever and abdominal discomfort. Initial encounter. EXAM: CHEST  2 VIEW COMPARISON:  Chest radiograph performed 09/24/2012 FINDINGS: A 2.1 cm nodule is noted near the right lung apex. Peribronchial thickening is noted. No pleural effusion or pneumothorax is seen. The heart remains normal in size. No acute osseous abnormalities are seen. Scattered clips are noted at the right axilla. Clips are noted within the right upper quadrant, reflecting prior cholecystectomy. IMPRESSION: 1. 2.1 cm nodule near the right lung apex. CT of the chest would  be helpful for further evaluation, when and as deemed clinically appropriate. 2. Peribronchial thickening noted. Electronically Signed   By: Garald Balding M.D.   On: 05/11/2015 00:36   Ct Head Wo Contrast  05/11/2015  CLINICAL DATA:  10 minutes episode of slurred speech today. Onset at 20:30. EXAM: CT HEAD WITHOUT CONTRAST TECHNIQUE: Contiguous axial images were obtained from the base of the skull through the vertex without intravenous contrast. COMPARISON:  None. FINDINGS: There is extensive hypodensity and sulcal effacement in the right frontal lobe, primarily involving the white matter but some gyral involvement is present. A smaller focus of hypodensity is present in the posterior left temporal  lobe, with slight sulcal effacement. Patchy periventricular white matter hypodensity is present in the left frontal lobe. These areas of hypodensity may represent subacute multifocal infarction. However, vasogenic edema associated with cerebral masses is also a consideration. There is no midline shift. Basal cisterns are patent. There is no intracranial hemorrhage. There is no extra-axial fluid collection. No bony abnormality is evident. IMPRESSION: Large area of hypodensity in the right frontal lobe and smaller hypodense focus in the posterior left temporal lobe, both with sulcal effacement but no midline shift. These may represent nonhemorrhagic subacute infarctions, but brain MRI is recommended to exclude cerebral masses. These results were called by telephone at the time of interpretation on 05/11/2015 at 12:44 am to Dr. Leonides Schanz, who verbally acknowledged these results. Electronically Signed   By: Andreas Newport M.D.   On: 05/11/2015 00:51   Ct Chest W Contrast  05/11/2015  CLINICAL DATA:  Suspected brain metastases.  Pulmonary nodule. EXAM: CT CHEST, ABDOMEN, AND PELVIS WITH CONTRAST TECHNIQUE: Multidetector CT imaging of the chest, abdomen and pelvis was performed following the standard protocol during bolus administration of intravenous contrast. CONTRAST:  1 ISOVUE-300 IOPAMIDOL (ISOVUE-300) INJECTION 61% COMPARISON:  None. FINDINGS: CT CHEST THORACIC INLET/BODY WALL: Heterogeneously enhancing right thoracic node measuring 17 mm. Changes of right axillary dissection for breast cancer. No axillary adenopathy. MEDIASTINUM: Right bronchial, hilar, and bilateral mediastinal adenopathy (bilateral paratracheal). The right paratracheal node is heterogeneously enhancing and measures 27 mm in short axis. Normal heart size. No pericardial effusion. Atherosclerotic calcification, including at the coronary arteries. LUNG WINDOWS: Right upper lobe peripheral pulmonary nodule measuring up to 23 mm. This is likely a  primary bronchogenic carcinoma. Background emphysema. No definitive pulmonary metastasis. A 2 mm left lower lobe pulmonary nodule 3:33 will be seen on surveillance imaging. OSSEOUS: No suspicious lytic or blastic lesions. CT ABDOMEN AND PELVIS Abdominal wall:  No contributory findings. Hepatobiliary: No evidence of hepatic metastasis.Cholecystectomy. Pancreas: Unremarkable. Spleen:  Granulomatous changes. Adrenals/Urinary Tract: Only the right adrenal gland is seen/remains. No evidence of metastasis. Left nephrectomy with compensatory hypertrophy of the right kidney. Simple appearing right renal cyst. Unremarkable bladder. Reproductive:Hysterectomy and possible left oophorectomy. Negative right adnexa. Stomach/Bowel:  No obstruction. No appendicitis. Vascular/Lymphatic: No acute vascular abnormality. No mass or adenopathy. Peritoneal: No ascites or pneumoperitoneum. Musculoskeletal: No acute abnormalities. IMPRESSION: 1. Right upper lobe nodule with right hilar, bilateral mediastinal, and thoracic inlet adenopathy. 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 2. History of breast cancer with right axillary dissection. 3. Left nephrectomy for uncertain indication. Electronically Signed   By: Monte Fantasia M.D.   On: 05/11/2015 15:29   Mr Jeri Cos LE Contrast  05/21/2015  CLINICAL DATA:  65 year old female with multiple brain metastases discovered on CT an MRI following presentation of left facial droop. Evidence of primary tumor in the  chest on CT, with bronchoscopic needle biopsy pathology suggesting non-small cell carcinoma. Stereotactic radiosurgery planning. Subsequent encounter. EXAM: MRI HEAD WITHOUT AND WITH CONTRAST TECHNIQUE: Multiplanar, multiecho pulse sequences of the brain and surrounding structures were obtained without and with intravenous contrast. CONTRAST:  51m MULTIHANCE GADOBENATE DIMEGLUMINE 529 MG/ML IV SOLN COMPARISON:  Brain MRI 05/11/2015. FINDINGS: Once  again, 6 small enhancing supratentorial brain metastases are identified. These are annotated with there is on series 11, and correspond to the 6 lesions seen on 05/11/2015. Interestingly, 1 or 2 of these appear slightly smaller today (posterior left temporal lobe series 11, image 83 today versus series 11, image 23 previously). As before, these do demonstrate significant surrounding vasogenic edema considering their small size, but the degree of edema has regressed. There is no intracranial mass effect at this time. None of the lesions appear hemorrhagic. No new brain metastasis identified.  No dural thickening. No restricted diffusion to suggest acute infarction. No ventriculomegaly, extra-axial collection or acute intracranial hemorrhage. Cervicomedullary junction and pituitary are within normal limits. Major intracranial vascular flow voids are stable. Negative cervicomedullary junction. Negative visualized cervical spinal cord. Mildly heterogeneous bone marrow signal, but no suspicious marrow lesion. Visible internal auditory structures appear normal. Mastoids are clear. Paranasal sinuses are clear. Negative orbit and scalp soft tissues. IMPRESSION: 1. Re- demonstration of 6 small enhancing supratentorial brain metastases. These are annotated on series 11. Mildly regressed cerebral edema. No significant intracranial mass effect. 2. No new metastasis or new intracranial abnormality identified. Electronically Signed   By: HGenevie AnnM.D.   On: 05/21/2015 14:41   Mr BJeri CosWNTContrast  05/11/2015  CLINICAL DATA:  Brief episode of difficulty speaking, facial twitching, drooling LEFT facial droop. Low-grade fever and body aches this week. On treatment for urinary tract infection. Assess for med passed a cyst. EXAM: MRI HEAD WITHOUT AND WITH CONTRAST TECHNIQUE: Multiplanar, multiecho pulse sequences of the brain and surrounding structures were obtained without and with intravenous contrast. CONTRAST:  159mMULTIHANCE  GADOBENATE DIMEGLUMINE 529 MG/ML IV SOLN COMPARISON:  CT head May 11, 2015 at 0013 hours FINDINGS: Homogeneously enhancing bright T2 subcentimeter nodules in the supratentorial brain with surrounding T2 bright FLAIR T2 hyperintense vasogenic edema consistent with metastasis as follows: 3 mm LEFT temporal occipital lobe (axial 23/44 postcontrast). 4 mm RIGHT mesial frontal lobe cortical lesion (axial 29/44 postcontrast). 6 mm round nodule RIGHT posterior frontal lobe cortex (axial 31/44 postcontrast). 2 mm RIGHT frontal and 6 mm RIGHT frontal convexity cortex (axial 37/44 postcontrast). 3 mm RIGHT frontal convexity cortex (axial 40/44 postcontrast. Faint linear enhancing nodule versus vessel within the LEFT frontal cortex/insula (coronal 20/28 postcontrast). No reduced diffusion to suggest acute ischemia or hypercellular tumor. No susceptibility artifact to suggest hemorrhage. Ventricles and sulci are normal for patient's age. Patchy supratentorial white matter FLAIR T2 hyperintensities exclusive of the aforementioned abnormality compatible with moderate chronic small vessel ischemic disease. No midline shift. No abnormal extra-axial fluid collections, dural or leptomeningeal enhancement. No extra-axial masses. Normal major intracranial vascular flow voids seen at the skull base though, dolichoectasia noted which can be seen with chronic hypertension. Ocular globes and orbital contents are normal. Paranasal sinuses and mastoid air cells are well aerated. Convex margin of the pituitary gland without sellar expansion. No cerebellar tonsillar ectopia. No suspicious calvarial bone marrow signal. IMPRESSION: At least 6 subcentimeter supratentorial predominately cortical based enhancing nodules compatible with nonhemorrhagic metastatic disease. Local edema without significant mass effect. Moderate chronic small vessel ischemic disease without acute  ischemia. Electronically Signed   By: Elon Alas M.D.   On:  05/11/2015 04:16   Ct Abdomen Pelvis W Contrast  05/11/2015  CLINICAL DATA:  Suspected brain metastases.  Pulmonary nodule. EXAM: CT CHEST, ABDOMEN, AND PELVIS WITH CONTRAST TECHNIQUE: Multidetector CT imaging of the chest, abdomen and pelvis was performed following the standard protocol during bolus administration of intravenous contrast. CONTRAST:  1 ISOVUE-300 IOPAMIDOL (ISOVUE-300) INJECTION 61% COMPARISON:  None. FINDINGS: CT CHEST THORACIC INLET/BODY WALL: Heterogeneously enhancing right thoracic node measuring 17 mm. Changes of right axillary dissection for breast cancer. No axillary adenopathy. MEDIASTINUM: Right bronchial, hilar, and bilateral mediastinal adenopathy (bilateral paratracheal). The right paratracheal node is heterogeneously enhancing and measures 27 mm in short axis. Normal heart size. No pericardial effusion. Atherosclerotic calcification, including at the coronary arteries. LUNG WINDOWS: Right upper lobe peripheral pulmonary nodule measuring up to 23 mm. This is likely a primary bronchogenic carcinoma. Background emphysema. No definitive pulmonary metastasis. A 2 mm left lower lobe pulmonary nodule 3:33 will be seen on surveillance imaging. OSSEOUS: No suspicious lytic or blastic lesions. CT ABDOMEN AND PELVIS Abdominal wall:  No contributory findings. Hepatobiliary: No evidence of hepatic metastasis.Cholecystectomy. Pancreas: Unremarkable. Spleen:  Granulomatous changes. Adrenals/Urinary Tract: Only the right adrenal gland is seen/remains. No evidence of metastasis. Left nephrectomy with compensatory hypertrophy of the right kidney. Simple appearing right renal cyst. Unremarkable bladder. Reproductive:Hysterectomy and possible left oophorectomy. Negative right adnexa. Stomach/Bowel:  No obstruction. No appendicitis. Vascular/Lymphatic: No acute vascular abnormality. No mass or adenopathy. Peritoneal: No ascites or pneumoperitoneum. Musculoskeletal: No acute abnormalities. IMPRESSION: 1.  Right upper lobe nodule with right hilar, bilateral mediastinal, and thoracic inlet adenopathy. 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 2. History of breast cancer with right axillary dissection. 3. Left nephrectomy for uncertain indication. Electronically Signed   By: Monte Fantasia M.D.   On: 05/11/2015 15:29   Dg Chest Port 1 View  05/14/2015  CLINICAL DATA:  Status post bronchoscopy with biopsy. EXAM: PORTABLE CHEST 1 VIEW COMPARISON:  CT scan of May 11, 2015. Radiograph of May 10, 2015. FINDINGS: No pneumothorax is seen. No pleural effusion is noted. Cardiomediastinal silhouette is within normal limits. Atherosclerosis of thoracic aorta is noted. Left lung is clear. Right upper lobe lung mass is again noted. Surgical clips are noted in right axillary region. IMPRESSION: No pneumothorax seen status post bronchoscopy with biopsy. Electronically Signed   By: Marijo Conception, M.D.   On: 05/14/2015 15:16   Dg C-arm Bronchoscopy  05/14/2015  CLINICAL DATA:  C-ARM BRONCHOSCOPY Fluoroscopy was utilized by the requesting physician.  No radiographic interpretation.    Labs:  CBC:  Recent Labs  05/12/15 0334 05/13/15 0557 05/14/15 0359 06/05/15 0640  WBC 4.7 12.9* 12.1* 2.7*  HGB 7.8* 8.3* 8.0* 9.3*  HCT 25.2* 25.3* 26.3* 29.5*  PLT 484* 503* 505* 188    COAGS:  Recent Labs  05/10/15 2305 05/13/15 0557  INR 1.11 1.21  APTT 40* 35    BMP:  Recent Labs  05/10/15 2305 05/10/15 2313 05/12/15 0334 05/13/15 0557 05/14/15 0359  NA 135 136 136 139 138  K 4.4 4.3 4.2 4.8 4.8  CL 101 101 102 104 103  CO2 23  --  '23 25 26  '$ GLUCOSE 114* 107* 146* 138* 132*  BUN '14 19 8 14 15  '$ CALCIUM 9.5  --  9.7 9.5 9.5  CREATININE 0.89 0.80 0.65 0.69 0.72  GFRNONAA >60  --  >60 >60 >60  GFRAA >60  --  >60 >60 >60    LIVER FUNCTION TESTS:  Recent Labs  05/10/15 2305  BILITOT 0.7  AST 17  ALT 10*  ALKPHOS 68  PROT 7.5  ALBUMIN 2.6*    TUMOR  MARKERS: No results for input(s): AFPTM, CEA, CA199, CHROMGRNA in the last 8760 hours.  Assessment and Plan:  Hx Breast Ca New AMS + Brain mets and Rt lung mass +NSC carcinoma on bronchoscopy Not sufficient for molecular testing Now scheduled for R lung mass biopsy for tissue sample for tests Risks and Benefits discussed with the patient including, but not limited to bleeding, hemoptysis, respiratory failure requiring intubation, infection, pneumothorax requiring chest tube placement, stroke from air embolism or even death. All of the patient's questions were answered, patient is agreeable to proceed. Consent signed and in chart.   Thank you for this interesting consult.  I greatly enjoyed meeting Rose [name] and look forward to participating in their care.  A copy of this report was sent to the requesting provider on this date.  Electronically Signed: Raisa Ditto A 06/05/2015, 7:20 AM   I spent a total of  30 Minutes   in face to face in clinical consultation, greater than 50% of which was counseling/coordinating care for Right lung mass biopsy

## 2015-06-05 NOTE — Procedures (Signed)
Interventional Radiology Procedure Note  Procedure:  CT guided biopsy of RUL lung nodule  Complications:  Regional pulmonary hemorrhage  Estimated Blood Loss: 10 mL  Findings:  18 G core bx x 2 via 17 G needle of RUL nodule from posterior approach.  CT shows hemorrhage around nodule after biopsy. No PTX.  No hemoptysis currently.  VSS.  Plan:  3 hour bedrest.  Eulas Post T. Kathlene Cote, M.D Pager:  [phone]

## 2015-06-05 NOTE — Discharge Instructions (Signed)

## 2015-06-07 ENCOUNTER — Encounter: Payer: Self-pay | Admitting: Hematology

## 2015-06-07 DIAGNOSIS — C3491 Malignant neoplasm of unspecified part of right bronchus or lung: Secondary | ICD-10-CM | POA: Insufficient documentation

## 2015-06-07 NOTE — Progress Notes (Signed)
This encounter was created in error - please disregard.

## 2015-06-08 ENCOUNTER — Telehealth: Payer: Self-pay | Admitting: *Deleted

## 2015-06-08 ENCOUNTER — Encounter: Payer: BLUE CROSS/BLUE SHIELD | Admitting: Hematology

## 2015-06-08 NOTE — Telephone Encounter (Signed)
"  I am calling to cancel today's appointment.  I need a morning appointment.  Other people are scheduling appointments for me without talking to me.  I have other appointments and things going on."

## 2015-06-09 NOTE — Addendum Note (Signed)
Encounter addended by: Kyung Rudd, MD on: 06/09/2015  4:53 PM<BR>     Documentation filed: Follow-up Section, LOS Section

## 2015-06-10 ENCOUNTER — Encounter (HOSPITAL_COMMUNITY)
Admission: RE | Admit: 2015-06-10 | Discharge: 2015-06-10 | Disposition: A | Discharge status: Home / Self Care | Payer: BLUE CROSS/BLUE SHIELD | Source: Ambulatory Visit | Attending: Radiation Oncology | Admitting: Radiation Oncology

## 2015-06-10 DIAGNOSIS — C3491 Malignant neoplasm of unspecified part of right bronchus or lung: Secondary | ICD-10-CM | POA: Insufficient documentation

## 2015-06-10 LAB — GLUCOSE, CAPILLARY: GLUCOSE-CAPILLARY: 101 mg/dL — AB (ref 65–99)

## 2015-06-10 MED ORDER — FLUDEOXYGLUCOSE F - 18 (FDG) INJECTION: 5.0000 | Freq: Once | INTRAVENOUS | Status: DC | PRN

## 2015-06-11 ENCOUNTER — Other Ambulatory Visit (HOSPITAL_BASED_OUTPATIENT_CLINIC_OR_DEPARTMENT_OTHER): Payer: BLUE CROSS/BLUE SHIELD

## 2015-06-11 ENCOUNTER — Encounter: Payer: Self-pay | Admitting: Hematology

## 2015-06-11 ENCOUNTER — Telehealth: Payer: Self-pay | Admitting: Hematology

## 2015-06-11 ENCOUNTER — Ambulatory Visit (HOSPITAL_BASED_OUTPATIENT_CLINIC_OR_DEPARTMENT_OTHER): Payer: BLUE CROSS/BLUE SHIELD | Admitting: Hematology

## 2015-06-11 VITALS — BP 125/56 | HR 100 | Temp 98.4°F | Resp 20 | Ht 61.0 in | Wt 119.3 lb

## 2015-06-11 DIAGNOSIS — G4700 Insomnia, unspecified: Secondary | ICD-10-CM

## 2015-06-11 DIAGNOSIS — C7931 Secondary malignant neoplasm of brain: Secondary | ICD-10-CM | POA: Diagnosis not present

## 2015-06-11 DIAGNOSIS — D630 Anemia in neoplastic disease: Secondary | ICD-10-CM

## 2015-06-11 DIAGNOSIS — C3491 Malignant neoplasm of unspecified part of right bronchus or lung: Secondary | ICD-10-CM

## 2015-06-11 DIAGNOSIS — C3411 Malignant neoplasm of upper lobe, right bronchus or lung: Secondary | ICD-10-CM

## 2015-06-11 DIAGNOSIS — D649 Anemia, unspecified: Secondary | ICD-10-CM

## 2015-06-11 DIAGNOSIS — J449 Chronic obstructive pulmonary disease, unspecified: Secondary | ICD-10-CM | POA: Diagnosis not present

## 2015-06-11 DIAGNOSIS — I10 Essential (primary) hypertension: Secondary | ICD-10-CM

## 2015-06-11 LAB — CBC WITH DIFFERENTIAL/PLATELET
BASO%: 0 % (ref 0.0–2.0)
Basophils Absolute: 0 10*3/uL (ref 0.0–0.1)
EOS ABS: 0 10*3/uL (ref 0.0–0.5)
EOS%: 0 % (ref 0.0–7.0)
HCT: 31.1 % — ABNORMAL LOW (ref 34.8–46.6)
HEMOGLOBIN: 9.9 g/dL — AB (ref 11.6–15.9)
LYMPH%: 12.7 % — AB (ref 14.0–49.7)
MCH: 25.3 pg (ref 25.1–34.0)
MCHC: 31.8 g/dL (ref 31.5–36.0)
MCV: 79.3 fL — AB (ref 79.5–101.0)
MONO#: 0.5 10*3/uL (ref 0.1–0.9)
MONO%: 7.3 % (ref 0.0–14.0)
NEUT%: 80 % — ABNORMAL HIGH (ref 38.4–76.8)
NEUTROS ABS: 5.5 10*3/uL (ref 1.5–6.5)
Platelets: 617 10*3/uL — ABNORMAL HIGH (ref 145–400)
RBC: 3.92 10*6/uL (ref 3.70–5.45)
RDW: 19.2 % — AB (ref 11.2–14.5)
WBC: 6.9 10*3/uL (ref 3.9–10.3)
lymph#: 0.9 10*3/uL (ref 0.9–3.3)

## 2015-06-11 LAB — IRON AND TIBC
%SAT: 5 % — ABNORMAL LOW (ref 21–57)
Iron: 14 ug/dL — ABNORMAL LOW (ref 41–142)
TIBC: 263 ug/dL (ref 236–444)
UIBC: 248 ug/dL (ref 120–384)

## 2015-06-11 LAB — COMPREHENSIVE METABOLIC PANEL
ALBUMIN: 2.6 g/dL — AB (ref 3.5–5.0)
ALK PHOS: 89 U/L (ref 40–150)
ALT: 12 U/L (ref 0–55)
Anion Gap: 8 mEq/L (ref 3–11)
BILIRUBIN TOTAL: 0.39 mg/dL (ref 0.20–1.20)
BUN: 12.4 mg/dL (ref 7.0–26.0)
CO2: 25 meq/L (ref 22–29)
Calcium: 9.7 mg/dL (ref 8.4–10.4)
Chloride: 104 mEq/L (ref 98–109)
Creatinine: 0.7 mg/dL (ref 0.6–1.1)
GLUCOSE: 93 mg/dL (ref 70–140)
Potassium: 3.8 mEq/L (ref 3.5–5.1)
SODIUM: 137 meq/L (ref 136–145)
TOTAL PROTEIN: 7.5 g/dL (ref 6.4–8.3)

## 2015-06-11 LAB — FERRITIN: Ferritin: 242 ng/ml (ref 9–269)

## 2015-06-11 NOTE — Progress Notes (Signed)
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 Local edema without significant mass effect.   05/11/2015 Imaging CT chest, abdomen and pelvis with contrast showed right upper lobe nodule 1.7 cm, with right hilar, bilateral mediastinal, and the thoracic inlet adenopathy.   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 The biopsy or over the right upper lobe lung nodule was negative.   05/22/2015 - 05/27/2015 Radiation Therapy SRS to brain metastases    06/05/2015 Procedure IR RUL lung nodule biopsy    History of present illness (05/12/2015)  Patient is a 65 year old female with past medical history of hypertension, 30 year smoking history, remote history of breast cancer in 1992, presented with sudden onset episodes of difficulty speaking, drooling, and facial twitching on 09/09/2015. It lasted about 7-8 minutes. EMS was called and she was brought to the hospital. Brain scan showed multiple brain lesions, highly suspicious for metastatic disease. CT chest abdomen and pelvis revealed a 2.3 cm right upper lobe lung nodule, along was hilar and mediastinal adenopathy. Pulmonary was consulted for  biopsy. I saw her in the hospital, along with 2 of her granddaughters. She feels well now, denies any significant pain, dyspnea, or other symptoms. She does have mild chronic cough nonproductive. She states that her appetite has been low and she lost about 10-15 pounds in the past year. 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 She has 2 sisters and graduations in the area.  INTERVAL HISTORY:  Rose [name] returns for follow-up. I initially saw her in the hospital  Last month.  She has completed SRS  Brain radiation for his metastasis,  And underwent repeated right lung mass biopsy by interventional radiologist. She is here to discuss further treatment options. She has been doing well overall, denies any pain, cough, dyspnea, headaches, or other symptoms. Her only complains is insomnia, which is chronic for her.  She has good appetite and energy level. She noticed some taste change, but eats well. She lives alone, she works partime as a Quarry manager.  She is still on dexamethasone 8 mg twice daily.  REVIEW OF SYSTEMS:   Constitutional: Denies fevers, chills or abnormal weight loss Eyes: Denies blurriness of vision Ears, nose, mouth, throat, and face: Denies mucositis or sore throat Respiratory: Denies cough, dyspnea or wheezes Cardiovascular: Denies palpitation, chest discomfort or lower extremity swelling Gastrointestinal:  Denies nausea, heartburn or change in bowel habits Skin: Denies abnormal skin rashes Lymphatics: Denies new lymphadenopathy or easy bruising Neurological:Denies numbness, tingling or new weaknesses Behavioral/Psych: Mood is stable, no new changes  All other systems were reviewed with the patient and are negative.  MEDICAL HISTORY:  Past Medical History  Diagnosis Date  .  Hypertension   . Hyperlipidemia   . Family history of adverse reaction to anesthesia     "my son passed away I think related to the anesthesia"  . Asthmatic bronchitis   . Chronic  bronchitis ([place])   . Sinus headache   . Seizures ([place]) 05/10/2015    "it may have been related to me taking Cipro" (05/11/2015)  . Brain cancer ([place])   . Breast cancer [facility])     SURGICAL HISTORY: Past Surgical History  Procedure Laterality Date  . Appendectomy    . Abdominal hysterectomy    . Laparoscopic cholecystectomy    . Breast biopsy Right   . Breast lumpectomy Right   . Video bronchoscopy with endobronchial navigation N/A 05/14/2015    Procedure: VIDEO BRONCHOSCOPY WITH ENDOBRONCHIAL NAVIGATION;  Surgeon: Collene Gobble, MD;  Location: [place];  Service: Thoracic;  Laterality: N/A;  . Video bronchoscopy with endobronchial ultrasound N/A 05/14/2015    Procedure: VIDEO BRONCHOSCOPY WITH ENDOBRONCHIAL ULTRASOUND;  Surgeon: Collene Gobble, MD;  Location: [place];  Service: Thoracic;  Laterality: N/A;    ALLERGIES:  has No Known Allergies.  MEDICATIONS:  Current Outpatient Prescriptions  Medication Sig Dispense Refill  . aspirin EC 81 MG tablet Take 81 mg by mouth daily.    . cholecalciferol (VITAMIN D) 1000 units tablet Take 1,000 Units by mouth daily.    Marland Kitchen dexamethasone (DECADRON) 4 MG tablet Take 1 tablet (4 mg total) by mouth 4 (four) times daily. To be tapered down by Dr. Lisbeth Renshaw (Patient taking differently: Take 4 mg by mouth 4 (four) times daily. To be tapered down by Dr. Lisbeth Renshaw  Pt reports taking bid now 06/11/15) 60 tablet 0  . lactobacillus acidophilus (BACID) TABS tablet Take 2 tablets by mouth daily.    . RESTASIS 0.05 % ophthalmic emulsion Place 1 drop into both eyes 2 (two) times daily.  3  . albuterol (PROVENTIL HFA;VENTOLIN HFA) 108 (90 BASE) MCG/ACT inhaler Inhale 1-2 puffs into the lungs every 6 (six) hours as needed for wheezing. (Patient not taking: Reported on 05/27/2015) 1 Inhaler 0  . albuterol (PROVENTIL) (2.5 MG/3ML) 0.083% nebulizer solution Take 3 mLs (2.5 mg total) by nebulization every 6 (six) hours as needed for wheezing. (Patient not taking: Reported on 05/27/2015)  75 mL 12  . atenolol (TENORMIN) 50 MG tablet Take 50 mg by mouth daily. Reported on 06/11/2015    . levETIRAcetam (KEPPRA) 500 MG tablet Take 1 tablet (500 mg total) by mouth 2 (two) times daily. (Patient not taking: Reported on 05/27/2015) 60 tablet 0  . losartan-hydrochlorothiazide (HYZAAR) 50-12.5 MG tablet Take 1 tablet by mouth daily. Reported on 06/11/2015    . montelukast (SINGULAIR) 10 MG tablet Take 10 mg by mouth daily. Reported on 06/11/2015  0  . pravastatin (PRAVACHOL) 20 MG tablet Take 20 mg by mouth daily. Reported on 06/11/2015     No current facility-administered medications for this visit.   Facility-Administered Medications Ordered in Other Visits  Medication Dose Route Frequency Provider Last Rate Last Dose  . fludeoxyglucose F - 18 (FDG) injection 5 milli Curie  5 milli Curie Intravenous Once PRN Hayden Pedro, PA-C        PHYSICAL EXAMINATION: ECOG PERFORMANCE STATUS: 0 - Asymptomatic  Filed Vitals:   06/11/15 1006  BP: 125/56  Pulse: 100  Temp: 98.4 F (36.9 C)  Resp: 20   Filed Weights   06/11/15 1006  Weight: 119 lb 4.8 oz (54.114 kg)    GENERAL:alert,  no distress and comfortable SKIN: skin color, texture, turgor are normal, no rashes or significant lesions EYES: normal, Conjunctiva are pink and non-injected, sclera clear OROPHARYNX:no exudate, no erythema and lips, buccal mucosa, and tongue normal  NECK: supple, thyroid normal size, non-tender, without nodularity LYMPH:  no palpable lymphadenopathy in the cervical, axillary or inguinal LUNGS: clear to auscultation and percussion with normal breathing effort HEART: regular rate & rhythm and no murmurs and no lower extremity edema ABDOMEN:abdomen soft, non-tender and normal bowel sounds Musculoskeletal:no cyanosis of digits and no clubbing  NEURO: alert & oriented x 3 with fluent speech, no focal motor/sensory deficits  LABORATORY DATA:  I have reviewed the data as listed CBC Latest Ref Rng  06/05/2015 05/14/2015 05/13/2015  WBC 4.0 - 10.5 K/uL 2.7(L) 12.1(H) 12.9(H)  Hemoglobin 12.0 - 15.0 g/dL 9.3(L) 8.0(L) 8.3(L)  Hematocrit 36.0 - 46.0 % 29.5(L) 26.3(L) 25.3(L)  Platelets 150 - 400 K/uL 188 505(H) 503(H)     CMP Latest Ref Rng 05/14/2015 05/13/2015 05/12/2015  Glucose 65 - 99 mg/dL 132(H) 138(H) 146(H)  BUN 6 - 20 mg/dL 15 14 8   Creatinine 0.44 - 1.00 mg/dL 0.72 0.69 0.65  Sodium 135 - 145 mmol/L 138 139 136  Potassium 3.5 - 5.1 mmol/L 4.8 4.8 4.2  Chloride 101 - 111 mmol/L 103 104 102  CO2 22 - 32 mmol/L 26 25 23   Calcium 8.9 - 10.3 mg/dL 9.5 9.5 9.7  Total Protein 6.5 - 8.1 g/dL - - -  Total Bilirubin 0.3 - 1.2 mg/dL - - -  Alkaline Phos 38 - 126 U/L - - -  AST 15 - 41 U/L - - -  ALT 14 - 54 U/L - - -   PATHOLOGY REPORT Diagnosis 05/14/2015  Lung, biopsy, Right upper lobe - MILDLY INFLAMED LUNG PARENCHYMA. - THERE IS NO EVIDENCE OF MALIGNANCY.  Diagnosis 05/14/2015  FINE NEEDLE ASPIRATION: EBUS, 4R, C (SPECIMEN 3 OF , COLLECTED ON 05/14/15): MALIGNANT CELLS PRESENT, CONSISTENT WITH NON SMALL CELL CARCINOMA. SEE COMMENT. COMMENT: THERE IS INSUFFICIENT MATERIAL PRESENT FOR ADDITIONAL STUDIES.  Diagnosis 06/05/2015 Lung, needle/core biopsy(ies), right upper lobe - POORLY DIFFERENTIATED NON SMALL CELL CARCINOMA WITH ASSOCIATED NECROSIS. - SEE COMMENT. Microscopic Comment In lieu of immunohistochemical stains, tissue will be preserved for additional studies, if requested. Dr. Burr Medico was paged on 06/08/15. (JBK:gt, 06/08/15)  ADDITIONAL INFORMATION: The tumor cells are positive for cytokeratin 5/6, suggesting squamous cell carcinoma. Per clinician request, a block will be sent for PDL-1 testing and results reported separately. (JBK:gt, 06/09/15)   RADIOGRAPHIC STUDIES: I have personally reviewed the radiological images as listed and agreed with the findings in the report.  CT Chest, abdomen and pelvis with contrast 05/11/2015 IMPRESSION: 1. Right upper lobe nodule with  right hilar, bilateral mediastinal, and thoracic inlet adenopathy. 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 2. History of breast cancer with right axillary dissection. 3. Left nephrectomy for uncertain indication.  MRI brain w wo contrast 05/11/2015 IMPRESSION: At least 6 subcentimeter supratentorial predominately cortical based enhancing nodules compatible with nonhemorrhagic metastatic disease. Local edema without significant mass effect.  Moderate chronic small vessel ischemic disease without acute ischemia.  PET 06/10/2015 IMPRESSION: 1. Hypermetabolic 2.7 cm peripheral right upper lobe pulmonary nodule, consistent with primary bronchogenic carcinoma. 2. Ipsilateral peribronchial, ipsilateral mediastinal and right level 4 neck nodal metastases. 3. Asymmetric foci of hypermetabolism in the left inferior frontal lobe and right cerebellar hemisphere, cannot exclude intra-axial brain metastases in these locations in this patient with  known brain metastases. 4. Hypermetabolic thyroid isthmus nodule, for which a significant minority will represent thyroid malignancy. Correlate with thyroid ultrasound and ultrasound-guided fine-needle aspiration as clinically warranted. 5. No hypermetabolic metastatic disease in the abdomen, pelvis or skeleton. 6. Coronary atherosclerosis.  ASSESSMENT & PLAN:  A 65 year old female with past medical history of hypertension, 15 pack year history of smoking, presented with seizure like activity, images studies reviewed multiple (at least 6) brain lesions and a 2.3 cm right upper lobe lung nodule, with hilar and mediastinal adenopathy  1. 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  - her repeated [place] biopsy showed poor differentiated squamous cell carcinoma,  PET scan  showed hypermetabolic hilar, mediastinum, and  Right neck level IV nodal metastasis. - her brain metastasis has been treated with SRS , she is clinically doing very well, her dexamethasone will be tapered off gradually by Dr. Lisbeth Renshaw. - I reviewed the natural history  And incurable nature of her metastatic squamous cell lung cancer,  Her overall prognosis is very poor giving them metastatic disease. - The goal of therapy is palliative and prolong her life. - I discussed the option of systemic therapy. If her tumor has significant PDL 1 expression (>50%),  She would benefit from first line immunotherapy Keytruda.  Immunotherapy is certainly a great option as second line treatment regardless the PDL 1 expression. The potential benefit and side effects were discussed with patient -If PDL 1 expression less than 50%, I recommend her to start systemic chemotherapy with carboplatin and Abraxane on day 1, 8, 15 every 28 days cycle.  The potential benefit and side effects of chemotherapy were reviewed with her also. - patient expressed good understanding about the above, and wishes to start systemic therapy soon. I anticipate her PT L1 test results will be back middle of next week, I'll tentatively start her  Chemotherapy treatment next Friday if PD-L1 negative.   2. Anemia  -Iron study showed low serum iron and saturation, normal ferritin, normal TIBC, most consistent with anemia of chronic disease, likely secondary to her underlying malignancy. She probably has a component of iron deficient anemia also -she has moderate anemia, Hb 9.9 today, will try one dose Feraheme next week, and start her on oral iron.    3. HTN, COPD -she will continue follow up with her PCP   Plan -await for tumor PD-L1 test result, to determine her first line systemic therapy -If PD-L1<50%, will start her on chemo  Carboplatin and Abraxane next Friday - chemotherapy class - I'll see her back next Thursday.  All questions were  answered. The patient knows to call the clinic with any problems, questions or concerns.    Truitt Merle, MD 06/11/2015

## 2015-06-11 NOTE — Telephone Encounter (Signed)
Gave adn pritned appt sched and avs for May

## 2015-06-12 ENCOUNTER — Telehealth: Payer: Self-pay | Admitting: Radiation Therapy

## 2015-06-12 ENCOUNTER — Other Ambulatory Visit: Payer: Self-pay | Admitting: Radiation Oncology

## 2015-06-12 ENCOUNTER — Telehealth: Payer: Self-pay | Admitting: Hematology

## 2015-06-12 ENCOUNTER — Telehealth: Payer: Self-pay | Admitting: *Deleted

## 2015-06-12 MED ORDER — DEXAMETHASONE 4 MG PO TABS: ORAL_TABLET | ORAL | Status: DC

## 2015-06-12 NOTE — Telephone Encounter (Addendum)
Per Shona Simpson, PA, below is Dr. Ida Rogue steroid taper instructions to give Ms. Pedro.   4 mg bid x 2 weeks 4 mg once per day x 1 week  (advised to take in the a.m. when taking once) 2 mg once per day x 1 week 2 mg every other day x 1 week Stop   I gave these instructions to Lemon over the phone on 5/12 @ 3:30 pm. She was able to repeat these instructions back to me when we were done. Gweneth does not feel that she will have enough medication to complete this taper, so I have forwarded this message to Dr. Ida Rogue PA, Shona Simpson, requesting a refill be sent to [facility] at the corner of [address] and [address].     Mont Dutton R.T.(R)(T) Special Procedures Navigator  [phone]

## 2015-06-12 NOTE — Telephone Encounter (Signed)
Per staff message and POF I have scheduled appts. Advised scheduler of appts. JMW  

## 2015-06-12 NOTE — Telephone Encounter (Addendum)
I spoke with Rose [name] during a routine [place] Follow-up call. She is doing well, no complaints or issues since her SRS was delivered on 06/03/15 to 6 brain metastases.         Rose [name] did have a question about a couple of medications that she is supposed to be taking. She said that her drug store, [facility] on the corner of [address], did not receive an Rx for her Keppra from Dr. Donnella Bi office. I called and left a message on Rose [name]'s voicemail at [facility] requesting that the prescription be called in again since [facility] has no record of it per the patient.   Rose [name] also mentioned that when she saw Dr. Burr Medico yesterday, she was instructed to ask [place] about her steroid taper instructions. Currently she is taking 4 mg bid.    I will send this message to Dr. Lisbeth Renshaw and his nurse Thayer Headings for further instructions about this taper.   Mont Dutton R.T.(R)(T)  Special Procedures Navigator

## 2015-06-12 NOTE — Telephone Encounter (Signed)
per pof to sch pt appt-sent MW email to sch trmt-cld & spoke to pt about chemo edu class/time-pt aware pof fera

## 2015-06-16 LAB — FUNGUS CULTURE WITH STAIN

## 2015-06-16 LAB — FUNGUS CULTURE RESULT

## 2015-06-16 LAB — FUNGAL ORGANISM REFLEX

## 2015-06-17 ENCOUNTER — Encounter: Payer: Self-pay | Admitting: Neurology

## 2015-06-17 ENCOUNTER — Ambulatory Visit (INDEPENDENT_AMBULATORY_CARE_PROVIDER_SITE_OTHER): Payer: BLUE CROSS/BLUE SHIELD | Admitting: Neurology

## 2015-06-17 ENCOUNTER — Other Ambulatory Visit: Payer: Self-pay | Admitting: Hematology

## 2015-06-17 VITALS — BP 120/72 | HR 96 | Ht 61.0 in | Wt 120.5 lb

## 2015-06-17 DIAGNOSIS — C7931 Secondary malignant neoplasm of brain: Secondary | ICD-10-CM

## 2015-06-17 DIAGNOSIS — R569 Unspecified convulsions: Secondary | ICD-10-CM

## 2015-06-17 MED ORDER — VITAMIN D3 25 MCG (1000 UNIT) PO TABS: 1000.0000 [IU] | ORAL_TABLET | Freq: Every day | ORAL | Status: DC

## 2015-06-17 MED ORDER — LEVETIRACETAM 500 MG PO TABS: 500.0000 mg | ORAL_TABLET | Freq: Two times a day (BID) | ORAL | Status: DC

## 2015-06-17 NOTE — Addendum Note (Signed)
Addended by: Marcial Pacas on: 06/17/2015 10:02 AM   Modules accepted: Level of Service

## 2015-06-17 NOTE — Progress Notes (Signed)
PATIENT: Rose [name] DOB: 1950-04-29  Chief Complaint  Patient presents with  . Seizures    Reports having her first and only seizure on 05/14/15.  She was treated in the hospital for this event.  She was prescribed Keppra '500mg'$ , BID but has never started taking this medication.     HISTORICAL  Rose [name] Is a 65 years old right-handed female, follow-up for hospital discharge for new onset of partial seizure in Jun 17 2015.  She presented with sudden onset right arm facial twitching without loss of consciousness in May 11 2015, she was able to call 911, was brought to hospital  Evaluation has demonstrated metastatic brain cancer later with biopsy-proven right lung upper lobe squamous cell carcinoma, poorly differentiated, stage IV, CAT scan showed hypermetabolic hilar, mediastinum, andright neck level IV nodal metastasis. She was evaluated by oncologist, her overall prognosis is very poor. She was given palliative brain radiation therapy, also was treated with tapering dose of Decadron.  She drove to office today, she still works part-time as Retail banker, lives alone, per patient, there was no recurrent seizure. She was given prescription of Keppra 500 mg twice a day, but she has not refilled her prescription   I have personally reviewed MRI of the brain in April 2017 with without contrast: 6 subcentimeter supratentorial predominately cortical based enhancing nodules nonhemorrhagic metastatic disease involving right frontal, left temporal cortical region, Local edema without significant mass effect.  I reviewed laboratory evaluation, CBC showed anemia with hemoglobin of 9.9, normal CMP with creatinine 0.7  REVIEW OF SYSTEMS: Full 14 system review of systems performed and notable only for seizure   ALLERGIES: No Known Allergies  HOME MEDICATIONS: Current Outpatient Prescriptions  Medication Sig Dispense Refill  . aspirin EC 81 MG tablet Take 81 mg by mouth daily.     Marland Kitchen atenolol (TENORMIN) 50 MG tablet Take 50 mg by mouth daily. Reported on 06/11/2015    . cholecalciferol (VITAMIN D) 1000 units tablet Take 1,000 Units by mouth daily.    Marland Kitchen dexamethasone (DECADRON) 4 MG tablet To be tapered down by Dr. Lisbeth Renshaw 60 tablet 0  . lactobacillus acidophilus (BACID) TABS tablet Take 2 tablets by mouth daily.    Marland Kitchen levETIRAcetam (KEPPRA) 500 MG tablet Take 1 tablet (500 mg total) by mouth 2 (two) times daily. 60 tablet 0  . losartan-hydrochlorothiazide (HYZAAR) 50-12.5 MG tablet Take 1 tablet by mouth daily. Reported on 06/11/2015    . pravastatin (PRAVACHOL) 20 MG tablet Take 20 mg by mouth daily. Reported on 06/11/2015    . RESTASIS 0.05 % ophthalmic emulsion Place 1 drop into both eyes 2 (two) times daily.  3   No current facility-administered medications for this visit.    PAST MEDICAL HISTORY: Past Medical History  Diagnosis Date  . Hypertension   . Hyperlipidemia   . Family history of adverse reaction to anesthesia     "my son passed away I think related to the anesthesia"  . Asthmatic bronchitis   . Chronic bronchitis ([place])   . Sinus headache   . Seizures ([place]) 05/10/2015    "it may have been related to me taking Cipro" (05/11/2015)  . Brain cancer ([place])   . Breast cancer ([place])     PAST SURGICAL HISTORY: Past Surgical History  Procedure Laterality Date  . Appendectomy    . Abdominal hysterectomy    . Laparoscopic cholecystectomy    . Breast biopsy Right   . Breast lumpectomy Right   .  Video bronchoscopy with endobronchial navigation N/A 05/14/2015    Procedure: VIDEO BRONCHOSCOPY WITH ENDOBRONCHIAL NAVIGATION;  Surgeon: Collene Gobble, MD;  Location: [place];  Service: Thoracic;  Laterality: N/A;  . Video bronchoscopy with endobronchial ultrasound N/A 05/14/2015    Procedure: VIDEO BRONCHOSCOPY WITH ENDOBRONCHIAL ULTRASOUND;  Surgeon: Collene Gobble, MD;  Location: [place];  Service: Thoracic;  Laterality: N/A;    FAMILY HISTORY: Family History    Problem Relation Age of Onset  . Breast cancer Mother 69  . Gastric cancer Daughter 45  . Diabetes Sister   . Heart failure Father   . Hypertension Sister     SOCIAL HISTORY:  Social History   Social History  . Marital Status: Divorced    Spouse Name: N/A  . Number of Children: 2  . Years of Education: Associates   Occupational History  . [employer]   Social History Main Topics  . Smoking status: Former Smoker -- 0.50 packs/day for 36 years    Types: Cigarettes  . Smokeless tobacco: Never Used     Comment: quit 2 weeks ago 05/13/15  . Alcohol Use: No  . Drug Use: No  . Sexual Activity: Yes    Birth Control/ Protection: Surgical   Other Topics Concern  . Not on file   Social History Narrative   Lives at home alone.   Right-handed.   1 cup coffee per day.   Reports having two children that both passed away in their 41s from CHF.     PHYSICAL EXAM   Filed Vitals:   06/17/15 0834  BP: 120/72  Pulse: 96  Height: '5\' 1"'$  (1.549 m)  Weight: 120 lb 8 oz (54.658 kg)    Not recorded      Body mass index is 22.78 kg/(m^2).  PHYSICAL EXAMNIATION:  Gen: NAD, conversant, well nourised, obese, well groomed                     Cardiovascular: Regular rate rhythm, no peripheral edema, warm, nontender. Eyes: Conjunctivae clear without exudates or hemorrhage Neck: Supple, no carotid bruise. Pulmonary: Clear to auscultation bilaterally   NEUROLOGICAL EXAM:  MENTAL STATUS: Speech:    Speech is normal; fluent and spontaneous with normal comprehension.  Cognition:     Orientation to time, place and person     Normal recent and remote memory     Normal Attention span and concentration     Normal Language, naming, repeating,spontaneous speech     Fund of knowledge   CRANIAL NERVES: CN II: Visual fields are full to confrontation. Fundoscopic exam is normal with sharp discs and no vascular changes. Pupils are round equal and briskly  reactive to light. CN III, IV, VI: extraocular movement are normal. No ptosis. CN V: Facial sensation is intact to pinprick in all 3 divisions bilaterally. Corneal responses are intact.  CN VII: Face is symmetric with normal eye closure and smile. CN VIII: Hearing is normal to rubbing fingers CN IX, X: Palate elevates symmetrically. Phonation is normal. CN XI: Head turning and shoulder shrug are intact CN XII: Tongue is midline with normal movements and no atrophy.  MOTOR: There is no pronator drift of out-stretched arms. Muscle bulk and tone are normal. Muscle strength is normal.  REFLEXES: Reflexes are 2+ and symmetric at the biceps, triceps, knees, and ankles. Plantar responses are flexor.  SENSORY: Intact to light touch, pinprick, positional sensation and vibratory sensation are  intact in fingers and toes.  COORDINATION: Rapid alternating movements and fine finger movements are intact. There is no dysmetria on finger-to-nose and heel-knee-shin.    GAIT/STANCE: Posture is normal. Gait is steady with normal steps, base, arm swing, and turning. Heel and toe walking are normal. Tandem gait is normal.  Romberg is absent.   DIAGNOSTIC DATA (LABS, IMAGING, TESTING) - I reviewed patient records, labs, notes, testing and imaging myself where available.   ASSESSMENT AND PLAN  Mehar Sagen is a 65 y.o. female   Metastatic lung squamous cell carcinoma to brain and nodes Partial seizure  Keep Keppra 500 mg twice a day  Document or seizure event  Advise patient if she develops seizure with alteration of consciousness, she should stop driving until event free for 6 months  Vitamin D supplements    Marcial Pacas, M.D. Ph.D.  [facility] [address], [place], [place] [address] Ph: [phone] Fax: [phone]  CC: Referring Provider

## 2015-06-18 ENCOUNTER — Other Ambulatory Visit: Payer: Self-pay | Admitting: Hematology

## 2015-06-18 ENCOUNTER — Encounter (HOSPITAL_COMMUNITY): Payer: Self-pay

## 2015-06-18 ENCOUNTER — Other Ambulatory Visit: Payer: BLUE CROSS/BLUE SHIELD

## 2015-06-18 ENCOUNTER — Encounter: Payer: Self-pay | Admitting: *Deleted

## 2015-06-19 ENCOUNTER — Telehealth: Payer: Self-pay | Admitting: Hematology

## 2015-06-19 ENCOUNTER — Other Ambulatory Visit (HOSPITAL_BASED_OUTPATIENT_CLINIC_OR_DEPARTMENT_OTHER): Payer: BLUE CROSS/BLUE SHIELD

## 2015-06-19 ENCOUNTER — Ambulatory Visit (HOSPITAL_BASED_OUTPATIENT_CLINIC_OR_DEPARTMENT_OTHER): Payer: BLUE CROSS/BLUE SHIELD

## 2015-06-19 ENCOUNTER — Telehealth: Payer: Self-pay | Admitting: *Deleted

## 2015-06-19 ENCOUNTER — Ambulatory Visit (HOSPITAL_BASED_OUTPATIENT_CLINIC_OR_DEPARTMENT_OTHER): Payer: BLUE CROSS/BLUE SHIELD | Admitting: Nurse Practitioner

## 2015-06-19 VITALS — BP 122/61 | HR 93 | Temp 98.6°F | Resp 18 | Ht 61.0 in | Wt 120.7 lb

## 2015-06-19 DIAGNOSIS — D649 Anemia, unspecified: Secondary | ICD-10-CM | POA: Diagnosis not present

## 2015-06-19 DIAGNOSIS — C3411 Malignant neoplasm of upper lobe, right bronchus or lung: Secondary | ICD-10-CM

## 2015-06-19 DIAGNOSIS — C779 Secondary and unspecified malignant neoplasm of lymph node, unspecified: Secondary | ICD-10-CM

## 2015-06-19 DIAGNOSIS — I10 Essential (primary) hypertension: Secondary | ICD-10-CM

## 2015-06-19 DIAGNOSIS — C7931 Secondary malignant neoplasm of brain: Secondary | ICD-10-CM

## 2015-06-19 DIAGNOSIS — D630 Anemia in neoplastic disease: Secondary | ICD-10-CM

## 2015-06-19 DIAGNOSIS — Z5111 Encounter for antineoplastic chemotherapy: Secondary | ICD-10-CM | POA: Diagnosis not present

## 2015-06-19 DIAGNOSIS — C3491 Malignant neoplasm of unspecified part of right bronchus or lung: Secondary | ICD-10-CM

## 2015-06-19 DIAGNOSIS — J449 Chronic obstructive pulmonary disease, unspecified: Secondary | ICD-10-CM

## 2015-06-19 LAB — COMPREHENSIVE METABOLIC PANEL
ALT: 11 U/L (ref 0–55)
ANION GAP: 8 meq/L (ref 3–11)
AST: 8 U/L (ref 5–34)
Albumin: 2.5 g/dL — ABNORMAL LOW (ref 3.5–5.0)
Alkaline Phosphatase: 79 U/L (ref 40–150)
BUN: 14.7 mg/dL (ref 7.0–26.0)
CHLORIDE: 107 meq/L (ref 98–109)
CO2: 24 meq/L (ref 22–29)
Calcium: 9.8 mg/dL (ref 8.4–10.4)
Creatinine: 0.7 mg/dL (ref 0.6–1.1)
Glucose: 141 mg/dl — ABNORMAL HIGH (ref 70–140)
POTASSIUM: 3.6 meq/L (ref 3.5–5.1)
Sodium: 139 mEq/L (ref 136–145)
Total Protein: 6.9 g/dL (ref 6.4–8.3)

## 2015-06-19 LAB — CBC WITH DIFFERENTIAL/PLATELET
BASO%: 0.4 % (ref 0.0–2.0)
Basophils Absolute: 0.1 10*3/uL (ref 0.0–0.1)
EOS ABS: 0 10*3/uL (ref 0.0–0.5)
EOS%: 0 % (ref 0.0–7.0)
HCT: 29.1 % — ABNORMAL LOW (ref 34.8–46.6)
HGB: 9.1 g/dL — ABNORMAL LOW (ref 11.6–15.9)
LYMPH%: 12.8 % — AB (ref 14.0–49.7)
MCH: 24.6 pg — AB (ref 25.1–34.0)
MCHC: 31.5 g/dL (ref 31.5–36.0)
MCV: 78.1 fL — AB (ref 79.5–101.0)
MONO#: 1.2 10*3/uL — AB (ref 0.1–0.9)
MONO%: 8.7 % (ref 0.0–14.0)
NEUT#: 11.2 10*3/uL — ABNORMAL HIGH (ref 1.5–6.5)
NEUT%: 78.1 % — AB (ref 38.4–76.8)
PLATELETS: 508 10*3/uL — AB (ref 145–400)
RBC: 3.72 10*6/uL (ref 3.70–5.45)
RDW: 20.1 % — ABNORMAL HIGH (ref 11.2–14.5)
WBC: 14.3 10*3/uL — ABNORMAL HIGH (ref 3.9–10.3)
lymph#: 1.8 10*3/uL (ref 0.9–3.3)

## 2015-06-19 MED ORDER — PACLITAXEL PROTEIN-BOUND CHEMO INJECTION 100 MG
100.0000 mg/m2 | Freq: Once | INTRAVENOUS | Status: AC
  Administered 2015-06-19: 150 mg via INTRAVENOUS
  Filled 2015-06-19: qty 30

## 2015-06-19 MED ORDER — LORAZEPAM 0.5 MG PO TABS: 0.5000 mg | ORAL_TABLET | Freq: Four times a day (QID) | ORAL | Status: DC | PRN

## 2015-06-19 MED ORDER — PALONOSETRON HCL INJECTION 0.25 MG/5ML
INTRAVENOUS | Status: AC
  Filled 2015-06-19: qty 5

## 2015-06-19 MED ORDER — PROCHLORPERAZINE MALEATE 10 MG PO TABS: 10.0000 mg | ORAL_TABLET | Freq: Four times a day (QID) | ORAL | Status: DC | PRN

## 2015-06-19 MED ORDER — PALONOSETRON HCL INJECTION 0.25 MG/5ML
0.2500 mg | Freq: Once | INTRAVENOUS | Status: AC
  Administered 2015-06-19: 0.25 mg via INTRAVENOUS

## 2015-06-19 MED ORDER — ONDANSETRON HCL 8 MG PO TABS: 8.0000 mg | ORAL_TABLET | Freq: Two times a day (BID) | ORAL | Status: DC | PRN

## 2015-06-19 MED ORDER — SODIUM CHLORIDE 0.9 % IV SOLN
431.5000 mg | Freq: Once | INTRAVENOUS | Status: AC
  Administered 2015-06-19: 430 mg via INTRAVENOUS
  Filled 2015-06-19: qty 43

## 2015-06-19 MED ORDER — SODIUM CHLORIDE 0.9 % IV SOLN
10.0000 mg | Freq: Once | INTRAVENOUS | Status: AC
  Administered 2015-06-19: 10 mg via INTRAVENOUS
  Filled 2015-06-19: qty 1

## 2015-06-19 MED ORDER — SODIUM CHLORIDE 0.9 % IV SOLN
Freq: Once | INTRAVENOUS | Status: AC
  Administered 2015-06-19: 13:00:00 via INTRAVENOUS

## 2015-06-19 NOTE — Telephone Encounter (Signed)
Per patient request I have moved her appts to earlier

## 2015-06-19 NOTE — Telephone Encounter (Signed)
Gave and printed appt shced and avs fo rpt for May and June

## 2015-06-19 NOTE — Progress Notes (Signed)
  [place] OFFICE PROGRESS NOTE   Diagnosis:  Metastatic non-small cell lung cancer SUMMARY OF ONCOLOGIC HISTORY: Oncology History   Primary cancer of right lung metastatic to other site [facility])  Staging form: Lung, AJCC 7th Edition  Clinical stage from 05/14/2015: Stage IV (T1a, N3, M1b) - Signed by Truitt Merle, MD on 06/07/2015       Primary cancer of right lung metastatic to other site [facility])   05/11/2015 Imaging Brain MRI with and without contrast showed at least 6 subcentimeter supra tentorial cortical-based enhanced nodules compatible with nonhemorrhagic metastatic disease. Local edema without significant mass effect.   05/11/2015 Imaging CT chest, abdomen and pelvis with contrast showed right upper lobe nodule 1.7 cm, with right hilar, bilateral mediastinal, and the thoracic inlet adenopathy.   05/14/2015 Initial Diagnosis Primary cancer of right lung metastatic to other site [facility])   05/14/2015 Initial Biopsy Bronchoscopy and EBUS biopsy of 4R node showed non-small cell carcinoma, insufficient material for additional studies. The biopsy or over the right upper lobe lung nodule was negative.   05/22/2015 - 05/27/2015 Radiation Therapy SRS to brain metastases    06/05/2015 Procedure IR RUL lung nodule biopsy         INTERVAL HISTORY:   Ms. January returns as scheduled. She feels "wonderful". No nausea or vomiting. No mouth sores. No diarrhea. No shortness of breath or cough. No fever. No seizure activity. No unusual headaches. No visual disturbance.  Objective:  Vital signs in last 24 hours:  Blood pressure 122/61, pulse 93, temperature 98.6 F (37 C), temperature source Oral, resp. rate 18, height '5\' 1"'$  (1.549 m), weight 120 lb 11.2 oz (54.749 kg), SpO2 100 %.    HEENT: No thrush or ulcers. Lymphatics: No palpable cervical or supraclavicular lymph nodes. Resp: Lungs clear bilaterally. Cardio: Regular rate and rhythm. GI: Abdomen soft and  nontender. No hepatomegaly. Vascular: No leg edema. Calves soft and nontender. Neuro: Alert and oriented. Motor strength 5 over 5.  Skin: No rash.    Lab Results:  Lab Results  Component Value Date   WBC 14.3* 06/19/2015   HGB 9.1* 06/19/2015   HCT 29.1* 06/19/2015   MCV 78.1* 06/19/2015   PLT 508* 06/19/2015   NEUTROABS 11.2* 06/19/2015    Imaging:  No results found.  Medications: I have reviewed the patient's current medications.  Assessment/Plan: 1. 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 2. Anemia, likely anemia of chronic disease secondary to underlying malignancy, probable component of iron deficiency as well. 3. Hypertension, COPD. She will continue follow-up with PCP.   Disposition: Ms. Popovich returns as scheduled. The PD-L1 test result returned at less than 1%. The plan is to proceed with cycle 1 carboplatin/Abraxane (day 1, day 8, day 15 schedule of a 28 day cycle) today as scheduled. Potential toxicities again reviewed at today's appointment. She has attended the chemotherapy education class. Questions were answered. She will return for the day 8 treatment in one week. We will see her in follow-up prior to the day 15 treatment in 2 weeks. Also plan for Feraheme infusion in 2 weeks. She will contact the office in the interim with any problems.  Plan reviewed with Dr. Burr Medico.  Ned Card ANP/GNP-BC   06/19/2015  11:44 AM

## 2015-06-19 NOTE — Patient Instructions (Signed)
[facility] Discharge Instructions for Patients Receiving Chemotherapy  Today you received the following chemotherapy agents; Abraxene and Carboplatin.   To help prevent nausea and vomiting after your treatment, we encourage you to take your nausea medication as directed.    If you develop nausea and vomiting that is not controlled by your nausea medication, call the clinic.   BELOW ARE SYMPTOMS THAT SHOULD BE REPORTED IMMEDIATELY:  *FEVER GREATER THAN 100.5 F  *CHILLS WITH OR WITHOUT FEVER  NAUSEA AND VOMITING THAT IS NOT CONTROLLED WITH YOUR NAUSEA MEDICATION  *UNUSUAL SHORTNESS OF BREATH  *UNUSUAL BRUISING OR BLEEDING  TENDERNESS IN MOUTH AND THROAT WITH OR WITHOUT PRESENCE OF ULCERS  *URINARY PROBLEMS  *BOWEL PROBLEMS  UNUSUAL RASH Items with * indicate a potential emergency and should be followed up as soon as possible.  Feel free to call the clinic you have any questions or concerns. The clinic phone number is [phone].  Please show the [place] at check-in to the Emergency Department and triage nurse.

## 2015-06-19 NOTE — Telephone Encounter (Signed)
Gave and printed appt sched and avs for pt for May and JUNE

## 2015-06-20 NOTE — Addendum Note (Signed)
Encounter addended by: Kyung Rudd, MD on: 06/20/2015 10:52 AM<BR>     Documentation filed: Notes Section, Visit Diagnoses

## 2015-06-20 NOTE — Progress Notes (Signed)
  Radiation Oncology         [phone] ________________________________  Name: Rose [name] MRN: [id number]  Date: 06/03/2015  DOB: 1950-12-18   SPECIAL TREATMENT PROCEDURE   3D TREATMENT PLANNING AND DOSIMETRY: The patient's radiation plan was reviewed and approved by Dr. Sherwood Gambler from neurosurgery and radiation oncology prior to treatment. It showed 3-dimensional radiation distributions overlaid onto the planning CT/MRI image set. The [facility] for the target structures as well as the organs at risk were reviewed. The documentation of the 3D plan and dosimetry are filed in the radiation oncology EMR.   NARRATIVE: The patient was brought to the TrueBeam stereotactic radiation treatment machine and placed supine on the CT couch. The head frame was applied, and the patient was set up for stereotactic radiosurgery. Neurosurgery was present for the set-up and delivery   SIMULATION VERIFICATION: In the couch zero-angle position, the patient underwent Exactrac imaging using the Brainlab system with orthogonal KV images. These were carefully aligned and repeated to confirm treatment position for each of the isocenters. The Exactrac snap film verification was repeated at each couch angle.   SPECIAL TREATMENT PROCEDURE: The patient received stereotactic radiosurgery to the following target:  6 targets were treated using 21 Arcs in total to a prescription dose of 20 Gy. ExacTrac Snap verification was performed for each couch angle.   STEREOTACTIC TREATMENT MANAGEMENT: Following delivery, the patient was transported to nursing in stable condition and monitored for possible acute effects. Vital signs were recorded . The patient tolerated treatment without significant acute effects, and was discharged to home in stable condition.  PLAN: Follow-up in one month.   ------------------------------------------------  Jodelle Gross, MD, PhD

## 2015-06-20 NOTE — Progress Notes (Signed)
  Radiation Oncology         [phone] ________________________________  Name: Rose [name] MRN: [id number]  Date: 06/03/2015  DOB: July 20, 1950  End of Treatment Note  Diagnosis:      ICD-9-CM ICD-10-CM   1. 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 ExacTrac Snap verification was performed for each couch angle.   Narrative: The patient tolerated radiation treatment well.   There were no signs of acute toxicity after treatment.  Plan: The patient has completed radiation treatment. The patient will return to radiation oncology clinic for routine followup in one month. I advised the patient to call or return sooner if they have any questions or concerns related to their recovery or treatment. ________________________________  ------------------------------------------------  Jodelle Gross, MD, PhD

## 2015-06-20 NOTE — Progress Notes (Signed)
  Radiation Oncology         [phone] ________________________________  Name: Amit Meloy MRN: [id number]  Date: 05/27/2015  DOB: Jan 28, 1951  DIAGNOSIS:     ICD-9-CM ICD-10-CM   1. 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  Identity was confirmed.  All relevant records and images related to the planned course of therapy were reviewed.  The patient freely provided informed written consent to proceed with treatment after reviewing the details related to the planned course of therapy. The consent form was witnessed and verified by the simulation staff. Intravenous access was established for contrast administration. Then, the patient was set-up in a stable reproducible supine position for radiation therapy.  A relocatable thermoplastic stereotactic head frame was fabricated for precise immobilization.  CT images were obtained.  Surface markings were placed.  The CT images were loaded into the planning software and fused with the patient's targeting MRI scan.  Then the target and avoidance structures were contoured.  Treatment planning then occurred.  The radiation prescription was entered and confirmed.  I have requested 3D planning  I have requested a DVH of the following structures: Brain stem, brain, left eye, right eye, lenses, optic chiasm, target volumes, uninvolved brain, and normal tissue.    SPECIAL TREATMENT PROCEDURE:  The planned course of therapy using radiation constitutes a special treatment procedure. Special care is required in the management of this patient for the following reasons. This treatment constitutes a Special Treatment Procedure for the following reason: High dose per fraction requiring special monitoring for increased toxicities of treatment including daily imaging.  The special nature of the planned course of radiotherapy will require increased physician supervision and  oversight to ensure patient's safety with optimal treatment outcomes.  PLAN:  The patient will receive 20 Gy in 1 fraction to 6 new intracranial metastases.   ------------------------------------------------  Jodelle Gross, MD, PhD

## 2015-06-20 NOTE — Addendum Note (Signed)
Encounter addended by: Kyung Rudd, MD on: 06/20/2015 10:50 AM<BR>     Documentation filed: Notes Section, Problem List, Visit Diagnoses

## 2015-06-22 ENCOUNTER — Telehealth: Payer: Self-pay | Admitting: *Deleted

## 2015-06-22 ENCOUNTER — Encounter: Payer: Self-pay | Admitting: Hematology

## 2015-06-22 ENCOUNTER — Encounter: Payer: Self-pay | Admitting: *Deleted

## 2015-06-22 MED ORDER — LEVETIRACETAM 100 MG/ML PO SOLN: 500.0000 mg | Freq: Two times a day (BID) | ORAL | Status: DC

## 2015-06-22 NOTE — Telephone Encounter (Signed)
Spoke to patient - she is aware that Dr. Krista Blue has provided her with a prescription for Keppra in liquid form.

## 2015-06-22 NOTE — Telephone Encounter (Signed)
Message For: OFFICE               Taken 22-MAY-17 at  1:56PM by [facility] ------------------------------------------------------------ Griffin Dakin              CID [id number]  Patient SAME                 Pt's Dr Krista Blue          Area Code 336 Phone# [phone] * [phone]    HE KB TCY ELYH TMBPJPE MEDS, PILLS ARE TOO BIG,      NEED LIQUID OR GEL CAPS                              Disp:Y/N N If Y = C/B If No Response In 69mnutes ============================================================

## 2015-06-22 NOTE — Progress Notes (Signed)
Called patient via telephone to introduce myself as Estate manager/land agent and complete copay assistance application through [place] for Abraxane. Application completed and submitted successfully. Application is being processed and will be contacted for decision. Advised patient and she thanked me.

## 2015-06-24 ENCOUNTER — Encounter: Payer: Self-pay | Admitting: Hematology

## 2015-06-24 NOTE — Progress Notes (Signed)
Received approval from [place] for Abraxane co-pay as well as a follow up phone call and email from [place] with [place]. Forwarded this information to [employer] whom is Ms.Lovins's advocate.

## 2015-06-26 ENCOUNTER — Ambulatory Visit (HOSPITAL_BASED_OUTPATIENT_CLINIC_OR_DEPARTMENT_OTHER): Payer: BLUE CROSS/BLUE SHIELD

## 2015-06-26 ENCOUNTER — Other Ambulatory Visit: Payer: Self-pay | Admitting: Hematology

## 2015-06-26 ENCOUNTER — Other Ambulatory Visit (HOSPITAL_BASED_OUTPATIENT_CLINIC_OR_DEPARTMENT_OTHER): Payer: BLUE CROSS/BLUE SHIELD

## 2015-06-26 VITALS — BP 115/50 | HR 81 | Temp 98.2°F | Resp 17

## 2015-06-26 DIAGNOSIS — C3491 Malignant neoplasm of unspecified part of right bronchus or lung: Secondary | ICD-10-CM

## 2015-06-26 DIAGNOSIS — C7931 Secondary malignant neoplasm of brain: Secondary | ICD-10-CM | POA: Diagnosis not present

## 2015-06-26 DIAGNOSIS — C3411 Malignant neoplasm of upper lobe, right bronchus or lung: Secondary | ICD-10-CM

## 2015-06-26 DIAGNOSIS — Z5111 Encounter for antineoplastic chemotherapy: Secondary | ICD-10-CM | POA: Diagnosis not present

## 2015-06-26 DIAGNOSIS — D509 Iron deficiency anemia, unspecified: Secondary | ICD-10-CM

## 2015-06-26 DIAGNOSIS — D630 Anemia in neoplastic disease: Secondary | ICD-10-CM

## 2015-06-26 LAB — CBC WITH DIFFERENTIAL/PLATELET
BASO%: 0.3 % (ref 0.0–2.0)
Basophils Absolute: 0 10*3/uL (ref 0.0–0.1)
EOS%: 0 % (ref 0.0–7.0)
Eosinophils Absolute: 0 10*3/uL (ref 0.0–0.5)
HCT: 28.5 % — ABNORMAL LOW (ref 34.8–46.6)
HGB: 9.2 g/dL — ABNORMAL LOW (ref 11.6–15.9)
LYMPH%: 7.6 % — AB (ref 14.0–49.7)
MCH: 25 pg — ABNORMAL LOW (ref 25.1–34.0)
MCHC: 32.2 g/dL (ref 31.5–36.0)
MCV: 77.6 fL — ABNORMAL LOW (ref 79.5–101.0)
MONO#: 0.5 10*3/uL (ref 0.1–0.9)
MONO%: 5.4 % (ref 0.0–14.0)
NEUT%: 86.7 % — ABNORMAL HIGH (ref 38.4–76.8)
NEUTROS ABS: 7.9 10*3/uL — AB (ref 1.5–6.5)
PLATELETS: 329 10*3/uL (ref 145–400)
RBC: 3.67 10*6/uL — AB (ref 3.70–5.45)
RDW: 20 % — ABNORMAL HIGH (ref 11.2–14.5)
WBC: 9.1 10*3/uL (ref 3.9–10.3)
lymph#: 0.7 10*3/uL — ABNORMAL LOW (ref 0.9–3.3)

## 2015-06-26 LAB — COMPREHENSIVE METABOLIC PANEL
ALT: 10 U/L (ref 0–55)
AST: 8 U/L (ref 5–34)
Albumin: 2.8 g/dL — ABNORMAL LOW (ref 3.5–5.0)
Alkaline Phosphatase: 82 U/L (ref 40–150)
Anion Gap: 9 mEq/L (ref 3–11)
BUN: 17.6 mg/dL (ref 7.0–26.0)
CO2: 23 meq/L (ref 22–29)
CREATININE: 0.8 mg/dL (ref 0.6–1.1)
Calcium: 9.8 mg/dL (ref 8.4–10.4)
Chloride: 104 mEq/L (ref 98–109)
EGFR: 89 mL/min/{1.73_m2} — ABNORMAL LOW (ref >90)
GLUCOSE: 103 mg/dL (ref 70–140)
Potassium: 4.1 mEq/L (ref 3.5–5.1)
SODIUM: 136 meq/L (ref 136–145)
TOTAL PROTEIN: 7.2 g/dL (ref 6.4–8.3)

## 2015-06-26 MED ORDER — PROCHLORPERAZINE MALEATE 10 MG PO TABS
ORAL_TABLET | ORAL | Status: AC
Start: 2015-06-26 — End: 2015-06-26
  Filled 2015-06-26: qty 1

## 2015-06-26 MED ORDER — PACLITAXEL PROTEIN-BOUND CHEMO INJECTION 100 MG
100.0000 mg/m2 | Freq: Once | INTRAVENOUS | Status: AC
  Administered 2015-06-26: 150 mg via INTRAVENOUS
  Filled 2015-06-26: qty 30

## 2015-06-26 MED ORDER — SODIUM CHLORIDE 0.9 % IV SOLN
Freq: Once | INTRAVENOUS | Status: AC
  Administered 2015-06-26: 11:00:00 via INTRAVENOUS

## 2015-06-26 MED ORDER — SODIUM CHLORIDE 0.9 % IV SOLN: Freq: Once | INTRAVENOUS | Status: DC

## 2015-06-26 MED ORDER — PROCHLORPERAZINE MALEATE 10 MG PO TABS
10.0000 mg | ORAL_TABLET | Freq: Once | ORAL | Status: AC
  Administered 2015-06-26: 10 mg via ORAL

## 2015-06-26 NOTE — Progress Notes (Signed)
OK to treat today with chemo with BP. Pt reports feeling well. Will give IVF in addition to chemo today.

## 2015-06-26 NOTE — Patient Instructions (Signed)
[facility] Discharge Instructions for Patients Receiving Chemotherapy  Today you received the following chemotherapy agents: Abraxane   To help prevent nausea and vomiting after your treatment, we encourage you to take your nausea medication as directed.    If you develop nausea and vomiting that is not controlled by your nausea medication, call the clinic.   BELOW ARE SYMPTOMS THAT SHOULD BE REPORTED IMMEDIATELY:  *FEVER GREATER THAN 100.5 F  *CHILLS WITH OR WITHOUT FEVER  NAUSEA AND VOMITING THAT IS NOT CONTROLLED WITH YOUR NAUSEA MEDICATION  *UNUSUAL SHORTNESS OF BREATH  *UNUSUAL BRUISING OR BLEEDING  TENDERNESS IN MOUTH AND THROAT WITH OR WITHOUT PRESENCE OF ULCERS  *URINARY PROBLEMS  *BOWEL PROBLEMS  UNUSUAL RASH Items with * indicate a potential emergency and should be followed up as soon as possible.  Feel free to call the clinic you have any questions or concerns. The clinic phone number is [phone].  Please show the CHEMO ALERT CARD at check-in to the Emergency Department and triage nurse.   

## 2015-07-03 ENCOUNTER — Other Ambulatory Visit (HOSPITAL_BASED_OUTPATIENT_CLINIC_OR_DEPARTMENT_OTHER): Payer: BLUE CROSS/BLUE SHIELD

## 2015-07-03 ENCOUNTER — Telehealth: Payer: Self-pay | Admitting: Hematology

## 2015-07-03 ENCOUNTER — Ambulatory Visit (HOSPITAL_BASED_OUTPATIENT_CLINIC_OR_DEPARTMENT_OTHER): Payer: BLUE CROSS/BLUE SHIELD | Admitting: Hematology

## 2015-07-03 ENCOUNTER — Ambulatory Visit (HOSPITAL_BASED_OUTPATIENT_CLINIC_OR_DEPARTMENT_OTHER): Payer: BLUE CROSS/BLUE SHIELD

## 2015-07-03 ENCOUNTER — Encounter: Payer: Self-pay | Admitting: Hematology

## 2015-07-03 ENCOUNTER — Other Ambulatory Visit: Payer: BLUE CROSS/BLUE SHIELD

## 2015-07-03 ENCOUNTER — Other Ambulatory Visit: Payer: Self-pay | Admitting: *Deleted

## 2015-07-03 VITALS — BP 123/65 | HR 92 | Temp 98.1°F | Resp 18 | Ht 61.0 in | Wt 122.6 lb

## 2015-07-03 DIAGNOSIS — C3491 Malignant neoplasm of unspecified part of right bronchus or lung: Secondary | ICD-10-CM

## 2015-07-03 DIAGNOSIS — C3411 Malignant neoplasm of upper lobe, right bronchus or lung: Secondary | ICD-10-CM

## 2015-07-03 DIAGNOSIS — C778 Secondary and unspecified malignant neoplasm of lymph nodes of multiple regions: Secondary | ICD-10-CM

## 2015-07-03 DIAGNOSIS — D509 Iron deficiency anemia, unspecified: Secondary | ICD-10-CM

## 2015-07-03 DIAGNOSIS — Z5111 Encounter for antineoplastic chemotherapy: Secondary | ICD-10-CM

## 2015-07-03 DIAGNOSIS — C7931 Secondary malignant neoplasm of brain: Secondary | ICD-10-CM | POA: Diagnosis not present

## 2015-07-03 DIAGNOSIS — J449 Chronic obstructive pulmonary disease, unspecified: Secondary | ICD-10-CM

## 2015-07-03 DIAGNOSIS — D630 Anemia in neoplastic disease: Secondary | ICD-10-CM | POA: Diagnosis not present

## 2015-07-03 DIAGNOSIS — I10 Essential (primary) hypertension: Secondary | ICD-10-CM

## 2015-07-03 LAB — CBC WITH DIFFERENTIAL/PLATELET
BASO%: 1 % (ref 0.0–2.0)
BASOS ABS: 0.1 10*3/uL (ref 0.0–0.1)
EOS ABS: 0 10*3/uL (ref 0.0–0.5)
EOS%: 0.4 % (ref 0.0–7.0)
HEMATOCRIT: 28.2 % — AB (ref 34.8–46.6)
HEMOGLOBIN: 8.7 g/dL — AB (ref 11.6–15.9)
LYMPH#: 2.3 10*3/uL (ref 0.9–3.3)
LYMPH%: 41.7 % (ref 14.0–49.7)
MCH: 24.8 pg — AB (ref 25.1–34.0)
MCHC: 31 g/dL — ABNORMAL LOW (ref 31.5–36.0)
MCV: 80.1 fL (ref 79.5–101.0)
MONO#: 0.7 10*3/uL (ref 0.1–0.9)
MONO%: 12.7 % (ref 0.0–14.0)
NEUT#: 2.4 10*3/uL (ref 1.5–6.5)
NEUT%: 44.2 % (ref 38.4–76.8)
PLATELETS: 218 10*3/uL (ref 145–400)
RBC: 3.52 10*6/uL — ABNORMAL LOW (ref 3.70–5.45)
RDW: 21.4 % — AB (ref 11.2–14.5)
WBC: 5.4 10*3/uL (ref 3.9–10.3)

## 2015-07-03 LAB — COMPREHENSIVE METABOLIC PANEL
ALBUMIN: 2.9 g/dL — AB (ref 3.5–5.0)
ALK PHOS: 81 U/L (ref 40–150)
ALT: 13 U/L (ref 0–55)
AST: 10 U/L (ref 5–34)
Anion Gap: 9 mEq/L (ref 3–11)
BUN: 13 mg/dL (ref 7.0–26.0)
CALCIUM: 9.7 mg/dL (ref 8.4–10.4)
CO2: 25 mEq/L (ref 22–29)
Chloride: 104 mEq/L (ref 98–109)
Creatinine: 0.7 mg/dL (ref 0.6–1.1)
Glucose: 79 mg/dl (ref 70–140)
POTASSIUM: 3.9 meq/L (ref 3.5–5.1)
Sodium: 139 mEq/L (ref 136–145)
Total Bilirubin: 0.4 mg/dL (ref 0.20–1.20)
Total Protein: 6.9 g/dL (ref 6.4–8.3)

## 2015-07-03 MED ORDER — PROCHLORPERAZINE MALEATE 10 MG PO TABS
10.0000 mg | ORAL_TABLET | Freq: Once | ORAL | Status: AC
  Administered 2015-07-03: 10 mg via ORAL

## 2015-07-03 MED ORDER — PACLITAXEL PROTEIN-BOUND CHEMO INJECTION 100 MG
100.0000 mg/m2 | Freq: Once | INTRAVENOUS | Status: AC
  Administered 2015-07-03: 150 mg via INTRAVENOUS
  Filled 2015-07-03: qty 30

## 2015-07-03 MED ORDER — PROCHLORPERAZINE MALEATE 10 MG PO TABS
ORAL_TABLET | ORAL | Status: AC
  Filled 2015-07-03: qty 1

## 2015-07-03 MED ORDER — SODIUM CHLORIDE 0.9 % IV SOLN
Freq: Once | INTRAVENOUS | Status: AC
  Administered 2015-07-03: 12:00:00 via INTRAVENOUS

## 2015-07-03 NOTE — Progress Notes (Signed)
Patient required multiple IV sticks (5 total) to obtain IV access. Patient was seen by Dr. Burr Medico in the infusion room. Mr. Rose [name] was given information on Port-a-Cath placement and asked to consider it as an option for chemo infusions since her venous access is very limited. Patient states that she will think about it. Since she had time constraints today, she opted not to have feraheme infusion today and Dr. Burr Medico will order PO iron to try instead. She did receive her abraxane infusion today as planned. Patient verbalized understanding.   Tammi Klippel J Ezeriah Luty RN

## 2015-07-03 NOTE — Addendum Note (Signed)
Addended by: Truitt Merle on: 07/03/2015 12:13 PM   Modules accepted: Orders

## 2015-07-03 NOTE — Progress Notes (Addendum)
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 Local edema without significant mass effect.   05/11/2015 Imaging CT chest, abdomen and pelvis with contrast showed right upper lobe nodule 1.7 cm, with right hilar, bilateral mediastinal, and the thoracic inlet adenopathy.   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 The biopsy or over the right upper lobe lung nodule was negative.   05/22/2015 - 05/27/2015 Radiation Therapy SRS to brain metastases    06/05/2015 Miscellaneous PD-L1 IHC <1%    06/05/2015 Pathology Results Right upper lobe lung nodule core needle biopsy showed poorly differentiated non-small cell lung cancer with associated necrosis, tumor cells are positive for cytokeratin 5/6, suggesting squamous cell carcinoma.   06/05/2015 Procedure IR RUL lung nodule biopsy    06/19/2015 -  Chemotherapy Carboplatin AUC 5 on D1,  and abraxane 135m/m2 on Day 1, 8 and 15, every 28 days    History of present illness (05/12/2015)  Patient is a 65year old female with past medical history of hypertension, 30 year smoking history, remote history of breast cancer in 1992, presented with sudden  onset episodes of difficulty speaking, drooling, and facial twitching on 09/09/2015. It lasted about 7-8 minutes. EMS was called and she was brought to the hospital. Brain scan showed multiple brain lesions, highly suspicious for metastatic disease. CT chest abdomen and pelvis revealed a 2.3 cm right upper lobe lung nodule, along was hilar and mediastinal adenopathy. Pulmonary was consulted for biopsy. I saw her in the hospital, along with 2 of her granddaughters. She feels well now, denies any significant pain, dyspnea, or other symptoms. She does have mild chronic cough nonproductive. She states that her appetite has been low and she lost about 10-15 pounds in the past year. 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 She has 2 sisters and graduations in the area.  CURRENT THERAPY: Carboplatin AUC 5 on day 1, Abraxane 100 mg/m on day 1, 8 and 15, every 28 days, started on 06/19/2015  INTERVAL HISTORY:  Mrs. STawneyreturns for follow-up and cycle 1 day 15 treatment. She has tolerated the first 2 treatments very well, with moderate fatigue for a few days, no nausea, or other noticeable side effects. She still works part-time as usual, and able to function very well at home. She denies any significant pain, or other complaints. Her dexamethasone has been tapered down to 2 mg once daily this week, and she will take 2 mg every other day next week then stop.  REVIEW OF SYSTEMS:   Constitutional: Denies fevers, chills or abnormal weight loss Eyes: Denies blurriness of vision Ears, nose, mouth, throat, and face: Denies mucositis or sore throat Respiratory: Denies cough, dyspnea or  wheezes Cardiovascular: Denies palpitation, chest discomfort or lower extremity swelling Gastrointestinal:  Denies nausea, heartburn or change in bowel habits Skin: Denies abnormal skin rashes Lymphatics: Denies new lymphadenopathy or easy bruising Neurological:Denies numbness, tingling or new  weaknesses Behavioral/Psych: Mood is stable, no new changes  All other systems were reviewed with the patient and are negative.  MEDICAL HISTORY:  Past Medical History  Diagnosis Date  . Hypertension   . Hyperlipidemia   . Family history of adverse reaction to anesthesia     "my son passed away I think related to the anesthesia"  . Asthmatic bronchitis   . Chronic bronchitis ([place])   . Sinus headache   . Seizures ([place]) 05/10/2015    "it may have been related to me taking Cipro" (05/11/2015)  . Brain cancer ([place])   . Breast cancer [facility])     SURGICAL HISTORY: Past Surgical History  Procedure Laterality Date  . Appendectomy    . Abdominal hysterectomy    . Laparoscopic cholecystectomy    . Breast biopsy Right   . Breast lumpectomy Right   . Video bronchoscopy with endobronchial navigation N/A 05/14/2015    Procedure: VIDEO BRONCHOSCOPY WITH ENDOBRONCHIAL NAVIGATION;  Surgeon: Collene Gobble, MD;  Location: [place];  Service: Thoracic;  Laterality: N/A;  . Video bronchoscopy with endobronchial ultrasound N/A 05/14/2015    Procedure: VIDEO BRONCHOSCOPY WITH ENDOBRONCHIAL ULTRASOUND;  Surgeon: Collene Gobble, MD;  Location: [place];  Service: Thoracic;  Laterality: N/A;    ALLERGIES:  has No Known Allergies.  MEDICATIONS:  Current Outpatient Prescriptions  Medication Sig Dispense Refill  . aspirin EC 81 MG tablet Take 81 mg by mouth daily.    . cholecalciferol (VITAMIN D) 1000 units tablet Take 1 tablet (1,000 Units total) by mouth daily. 180 tablet 6  . dexamethasone (DECADRON) 4 MG tablet To be tapered down by Dr. Lisbeth Renshaw (Patient taking differently: Take 4 mg by mouth 2 (two) times daily. To be tapered down by Dr. Lisbeth Renshaw) 60 tablet 0  . lactobacillus acidophilus (BACID) TABS tablet Take 1 tablet by mouth daily.     Marland Kitchen levETIRAcetam (KEPPRA) 100 MG/ML solution Take 5 mLs (500 mg total) by mouth 2 (two) times daily. 473 mL 11  . LORazepam (ATIVAN) 0.5 MG tablet Take 1 tablet (0.5 mg total)  by mouth every 6 (six) hours as needed (Nausea or vomiting). 30 tablet 0  . losartan-hydrochlorothiazide (HYZAAR) 50-12.5 MG tablet Take 1 tablet by mouth daily. Reported on 06/11/2015    . ondansetron (ZOFRAN) 8 MG tablet Take 1 tablet (8 mg total) by mouth 2 (two) times daily as needed for refractory nausea / vomiting. Start on day 3 after carboplatin chemo. 30 tablet 1  . pravastatin (PRAVACHOL) 20 MG tablet Take 20 mg by mouth daily. Reported on 06/11/2015    . prochlorperazine (COMPAZINE) 10 MG tablet Take 1 tablet (10 mg total) by mouth every 6 (six) hours as needed (Nausea or vomiting). 30 tablet 1  . RESTASIS 0.05 % ophthalmic emulsion Place 1 drop into both eyes 2 (two) times daily.  3  . atenolol (TENORMIN) 50 MG tablet Take 50 mg by mouth daily. Reported on 07/03/2015     No current facility-administered medications for this visit.    PHYSICAL EXAMINATION: ECOG PERFORMANCE STATUS: 0 - Asymptomatic  Filed Vitals:   07/03/15 1005  BP: 123/65  Pulse: 92  Temp: 98.1 F (36.7 C)  Resp: 18   Filed Weights   07/03/15 1005  Weight: 122  lb 9.6 oz (55.611 kg)    GENERAL:alert, no distress and comfortable SKIN: skin color, texture, turgor are normal, no rashes or significant lesions EYES: normal, Conjunctiva are pink and non-injected, sclera clear OROPHARYNX:no exudate, no erythema and lips, buccal mucosa, and tongue normal  NECK: supple, thyroid normal size, non-tender, without nodularity LYMPH:  no palpable lymphadenopathy in the cervical, axillary or inguinal LUNGS: clear to auscultation and percussion with normal breathing effort HEART: regular rate & rhythm and no murmurs and no lower extremity edema ABDOMEN:abdomen soft, non-tender and normal bowel sounds Musculoskeletal:no cyanosis of digits and no clubbing  NEURO: alert & oriented x 3 with fluent speech, no focal motor/sensory deficits  LABORATORY DATA:  I have reviewed the data as listed CBC Latest Ref Rng 07/03/2015  06/26/2015 06/19/2015  WBC 3.9 - 10.3 10e3/uL 5.4 9.1 14.3(H)  Hemoglobin 11.6 - 15.9 g/dL 8.7(L) 9.2(L) 9.1(L)  Hematocrit 34.8 - 46.6 % 28.2(L) 28.5(L) 29.1(L)  Platelets 145 - 400 10e3/uL 218 329 508(H)     CMP Latest Ref Rng 07/03/2015 06/26/2015 06/19/2015  Glucose 70 - 140 mg/dl 79 103 141(H)  BUN 7.0 - 26.0 mg/dL 13.0 17.6 14.7  Creatinine 0.6 - 1.1 mg/dL 0.7 0.8 0.7  Sodium 136 - 145 mEq/L 139 136 139  Potassium 3.5 - 5.1 mEq/L 3.9 4.1 3.6  CO2 22 - 29 mEq/L 25 23 24   Calcium 8.4 - 10.4 mg/dL 9.7 9.8 9.8  Total Protein 6.4 - 8.3 g/dL 6.9 7.2 6.9  Total Bilirubin 0.20 - 1.20 mg/dL 0.40 <0.30 <0.30  Alkaline Phos 40 - 150 U/L 81 82 79  AST 5 - 34 U/L 10 8 8   ALT 0 - 55 U/L 13 10 11    PATHOLOGY REPORT Diagnosis 05/14/2015  Lung, biopsy, Right upper lobe - MILDLY INFLAMED LUNG PARENCHYMA. - THERE IS NO EVIDENCE OF MALIGNANCY.  Diagnosis 05/14/2015  FINE NEEDLE ASPIRATION: EBUS, 4R, C (SPECIMEN 3 OF , COLLECTED ON 05/14/15): MALIGNANT CELLS PRESENT, CONSISTENT WITH NON SMALL CELL CARCINOMA. SEE COMMENT. COMMENT: THERE IS INSUFFICIENT MATERIAL PRESENT FOR ADDITIONAL STUDIES.  Diagnosis 06/05/2015 Lung, needle/core biopsy(ies), right upper lobe - POORLY DIFFERENTIATED NON SMALL CELL CARCINOMA WITH ASSOCIATED NECROSIS. - SEE COMMENT. Microscopic Comment In lieu of immunohistochemical stains, tissue will be preserved for additional studies, if requested. Dr. Burr Medico was paged on 06/08/15. (JBK:gt, 06/08/15)  ADDITIONAL INFORMATION: The tumor cells are positive for cyto5/5/2017keratin 5/6, suggesting squamous cell carcinoma. Per clinician request, a block will be sent for PDL-1 testing and results reported separately. (JBK:gt, 06/09/15)   RADIOGRAPHIC STUDIES: I have personally reviewed the radiological images as listed and agreed with the findings in the report.  CT Chest, abdomen and pelvis with contrast 05/11/2015 IMPRESSION: 1. Right upper lobe nodule with right hilar, bilateral  mediastinal, and thoracic inlet adenopathy. 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 2. History of breast cancer with right axillary dissection. 3. Left nephrectomy for uncertain indication.  MRI brain w wo contrast 05/11/2015 IMPRESSION: At least 6 subcentimeter supratentorial predominately cortical based enhancing nodules compatible with nonhemorrhagic metastatic disease. Local edema without significant mass effect.  Moderate chronic small vessel ischemic disease without acute ischemia.  PET 06/10/2015 IMPRESSION: 1. Hypermetabolic 2.7 cm peripheral right upper lobe pulmonary nodule, consistent with primary bronchogenic carcinoma. 2. Ipsilateral peribronchial, ipsilateral mediastinal and right level 4 neck nodal metastases. 3. Asymmetric foci of hypermetabolism in the left inferior frontal lobe and right cerebellar hemisphere, cannot exclude intra-axial brain metastases in these locations in this patient with  known brain metastases. 4. Hypermetabolic thyroid isthmus nodule, for which a significant minority will represent thyroid malignancy. Correlate with thyroid ultrasound and ultrasound-guided fine-needle aspiration as clinically warranted. 5. No hypermetabolic metastatic disease in the abdomen, pelvis or skeleton. 6. Coronary atherosclerosis.  ASSESSMENT & PLAN:  A 65 year old female with past medical history of hypertension, 15 pack year history of smoking, presented with seizure like activity, images studies reviewed multiple (at least 6) brain lesions and a 2.3 cm right upper lobe lung nodule, with hilar and mediastinal adenopathy  1. 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  - her repeated [place] biopsy showed poor differentiated squamous cell carcinoma,  PET scan showed hypermetabolic  hilar, mediastinum, and  Right neck level IV nodal metastasis. - her brain metastasis has been treated with SRS , she is clinically doing very well, her dexamethasone will be tapered off gradually by Dr. Lisbeth Renshaw. - I reviewed the natural history  And incurable nature of her metastatic squamous cell lung cancer,  Her overall prognosis is very poor giving them metastatic disease. - The goal of therapy is palliative and prolong her life. -Her tumor PD-L1<1%, unfortunately first line immunotherapy is not indicated. -She is currently on first-line chemotherapy with couple and Abraxane, tolerating very well, we'll continue. -Plan to repeat staging scan after 2 cycles of treatment.   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 She probably has a component of iron deficient anemia also -I recommend her to start taking iron pill over-the-counter, ferrous sulfate, 1-2 tablets a day. Constipation and management were reviewed with her. She agrees. If her iron l level does not improve in a month, I'll consider IV Feraheme. -Her anemia slightly worse today, hemoglobin 8.7, she is not symptomatically, we'll continue monitoring. No need blood transfusion.   3. HTN, COPD -she will continue follow up with her PCP   Plan -Lab reviewed, adequate for treatment, we'll proceed cycle 1 day 15 Abraxane today, she will be off chemotherapy next week. If she tolerates well and lab adequate, will incrase carbo to AUC 5.5 next cycle  -Start oral ferrous sulfate 1-2 tablets a day -Return to clinic in 2 weeks for cycle 2 day 1 treatment -We'll order restaging CT chest, abdomen and pelvis with contrast on next visit, to be done before cycle 3 -She will see Lattie Haw in 2 weeks and see me back in 4 weeks.  All questions were answered. The patient knows to  call the clinic with any problems, questions or concerns.    Truitt Merle, MD 07/03/2015

## 2015-07-03 NOTE — Telephone Encounter (Signed)
Pt will get sched in tx room

## 2015-07-10 ENCOUNTER — Other Ambulatory Visit: Payer: BLUE CROSS/BLUE SHIELD

## 2015-07-10 ENCOUNTER — Ambulatory Visit: Payer: BLUE CROSS/BLUE SHIELD

## 2015-07-13 ENCOUNTER — Encounter: Payer: Self-pay | Admitting: Radiation Oncology

## 2015-07-13 ENCOUNTER — Ambulatory Visit
Admission: RE | Admit: 2015-07-13 | Discharge: 2015-07-13 | Disposition: A | Discharge status: Home / Self Care | Payer: BLUE CROSS/BLUE SHIELD | Source: Ambulatory Visit | Attending: Radiation Oncology | Admitting: Radiation Oncology

## 2015-07-13 VITALS — BP 117/57 | HR 104 | Temp 98.0°F | Ht 61.0 in | Wt 124.9 lb

## 2015-07-13 DIAGNOSIS — Z7982 Long term (current) use of aspirin: Secondary | ICD-10-CM | POA: Diagnosis not present

## 2015-07-13 DIAGNOSIS — C7931 Secondary malignant neoplasm of brain: Secondary | ICD-10-CM | POA: Diagnosis present

## 2015-07-13 DIAGNOSIS — C3491 Malignant neoplasm of unspecified part of right bronchus or lung: Secondary | ICD-10-CM | POA: Insufficient documentation

## 2015-07-13 NOTE — Progress Notes (Signed)
Rose [name] reports that she is not experiencing ataxia, nausea/vomiting, vision changes,but at times sees flashing lights.  Nor changes in fine motor movement.

## 2015-07-13 NOTE — Progress Notes (Addendum)
Radiation Oncology         [phone] ________________________________  Name: Rose [name] MRN: [id number]  Date: 07/13/2015  DOB: 05-11-1950  Follow-Up Visit Note  CC: Rogers Blocker, MD  Bonnielee Haff, MD  Diagnosis:  Stage IV NSCL, squamous cell carcinoma of the right lung with brain metastases    ICD-9-CM ICD-10-CM   1. 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 Primary cancer of right lung metastatic to other site ([place]) 162.9 C34.91     Interval Since Last Radiation:  5   weeks   06/03/15: SRS protocol 20 Gy to 6 intracranial metastases in 1 fraction  Narrative:  The patient returns today for routine follow-up.  Overall the patient reports that she is doing well, and reports that she is has no headaches or seizure activity. She has noticed flashing lights in the last exam. Visual fields bilaterally. She denies any loss of vision, and denies any difficulty with visual acuity and when reading or watching TV. She denies any fevers or chills, chest pain or shortness of breath. Review of systems is obtained and is otherwise negative.                              ALLERGIES:  has No Known Allergies.  Meds: Current Outpatient Prescriptions  Medication Sig Dispense Refill  . aspirin EC 81 MG tablet Take 81 mg by mouth daily.    . cholecalciferol (VITAMIN D) 1000 units tablet Take 1 tablet (1,000 Units total) by mouth daily. 180 tablet 6  . lactobacillus acidophilus (BACID) TABS tablet Take 1 tablet by mouth daily.     Marland Kitchen levETIRAcetam (KEPPRA) 100 MG/ML solution Take 5 mLs (500 mg total) by mouth 2 (two) times daily. 473 mL 11  . RESTASIS 0.05 % ophthalmic emulsion Place 1 drop into both eyes 2 (two) times daily.  3  . atenolol (TENORMIN) 50 MG tablet Take 50 mg by mouth daily. Reported on 07/13/2015    . LORazepam (ATIVAN) 0.5 MG tablet Take 1 tablet (0.5 mg total) by mouth every 6 (six) hours as needed (Nausea or vomiting).  (Patient not taking: Reported on 07/13/2015) 30 tablet 0  . losartan-hydrochlorothiazide (HYZAAR) 50-12.5 MG tablet Take 1 tablet by mouth daily. Reported on 07/13/2015    . pravastatin (PRAVACHOL) 20 MG tablet Take 20 mg by mouth daily. Reported on 07/13/2015    . prochlorperazine (COMPAZINE) 10 MG tablet Take 1 tablet (10 mg total) by mouth every 6 (six) hours as needed (Nausea or vomiting). (Patient not taking: Reported on 07/13/2015) 30 tablet 1   No current facility-administered medications for this encounter.    Physical Findings:  height is '5\' 1"'$  (1.549 m) and weight is 124 lb 14.4 oz (56.654 kg). Her temperature is 98 F (36.7 C). Her blood pressure is 117/57 and her pulse is 104.   In general this is a well appearing African-American female in no acute distress. She's alert and oriented x4 and appropriate throughout the examination. Cardiopulmonary assessment is negative for acute distress and she exhibits normal effort. HEENT reveals that she has normocephalic, atraumatic, EOMs are intact. Funduscopic examination does not reveal any focal abnormalities bilaterally. Oropharynx is intact without visible lesions. External auditory canals bilaterally are inspected. No evidence of cerumen impaction is present. The TMs are notable bilaterally without any local abnormalities.  Lab Findings: Lab Results  Component Value Date  WBC 5.4 07/03/2015   WBC 2.7* 06/05/2015   HGB 8.7* 07/03/2015   HGB 9.3* 06/05/2015   HCT 28.2* 07/03/2015   HCT 29.5* 06/05/2015   PLT 218 07/03/2015   PLT 188 06/05/2015    Lab Results  Component Value Date   NA 139 07/03/2015   NA 138 05/14/2015   K 3.9 07/03/2015   K 4.8 05/14/2015   CHLORIDE 104 07/03/2015   CO2 25 07/03/2015   CO2 26 05/14/2015   GLUCOSE 79 07/03/2015   GLUCOSE 132* 05/14/2015   BUN 13.0 07/03/2015   BUN 15 05/14/2015   CREATININE 0.7 07/03/2015   CREATININE 0.72 05/14/2015   BILITOT 0.40 07/03/2015   BILITOT 0.7 05/10/2015    ALKPHOS 81 07/03/2015   ALKPHOS 68 05/10/2015   AST 10 07/03/2015   AST 17 05/10/2015   ALT 13 07/03/2015   ALT 10* 05/10/2015   PROT 6.9 07/03/2015   PROT 7.5 05/10/2015   ALBUMIN 2.9* 07/03/2015   ALBUMIN 2.6* 05/10/2015   CALCIUM 9.7 07/03/2015   CALCIUM 9.5 05/14/2015   ANIONGAP 9 07/03/2015   ANIONGAP 9 05/14/2015    Radiographic Findings: No results found.  Impression/Plan: 1. Stage IV NSCL, squamous cell carcinoma of the right lung with brain metastases. The patient continues to follow up with Dr. Burr Medico, and  Is receiving chemotherapy with Carboplatin and Abraxane q 28 days. She has completed her Radiation under the care of Dr. Lisbeth Renshaw as well. We will plan to move forward with repeat imaging in 3 months time for continued evaluation of her brain metastases. She understands that we will follow her with MRI scans at this interval for at least a year, and stretch the interval out thereafter. She is encouraged to call prior to her 3 month visit if she has any questions or concerns prior to that visit.   2. Flashing lights in bilateral visual fields. The patient's symptoms are concerning, and although this could be due to recent radiation therapy, we would like to rule out other organic eye issues. I will ask her to be evaluated this week by opthalmology and appreciate their recommendations.    Carola Rhine, PAC

## 2015-07-14 ENCOUNTER — Other Ambulatory Visit: Payer: Self-pay | Admitting: Hematology

## 2015-07-14 NOTE — Addendum Note (Signed)
Encounter addended by: Hayden Pedro, PA-C on: 07/14/2015 12:18 PM<BR>     Documentation filed: Notes Section

## 2015-07-15 ENCOUNTER — Telehealth: Payer: Self-pay | Admitting: *Deleted

## 2015-07-15 NOTE — Telephone Encounter (Signed)
Called patient to inform that her appt. With Dr. Wyatt Portela has been moved from 07-15-15 @ 8:30 am to 07-16-15 @ 11 am, lvm for a return call

## 2015-07-16 ENCOUNTER — Telehealth: Payer: Self-pay | Admitting: *Deleted

## 2015-07-16 NOTE — Telephone Encounter (Signed)
Tiffany from [facility] called.  She was finally able to reach the patient re: scheduling PAC and patient stated she would call her when she was ready to schedule this.

## 2015-07-17 ENCOUNTER — Telehealth: Payer: Self-pay | Admitting: *Deleted

## 2015-07-17 ENCOUNTER — Ambulatory Visit (HOSPITAL_BASED_OUTPATIENT_CLINIC_OR_DEPARTMENT_OTHER): Payer: BLUE CROSS/BLUE SHIELD | Admitting: Nurse Practitioner

## 2015-07-17 ENCOUNTER — Ambulatory Visit (HOSPITAL_BASED_OUTPATIENT_CLINIC_OR_DEPARTMENT_OTHER): Payer: BLUE CROSS/BLUE SHIELD

## 2015-07-17 ENCOUNTER — Other Ambulatory Visit (HOSPITAL_BASED_OUTPATIENT_CLINIC_OR_DEPARTMENT_OTHER): Payer: BLUE CROSS/BLUE SHIELD

## 2015-07-17 VITALS — BP 128/51 | HR 96 | Temp 98.3°F | Resp 20 | Ht 61.0 in | Wt 123.4 lb

## 2015-07-17 DIAGNOSIS — D630 Anemia in neoplastic disease: Secondary | ICD-10-CM | POA: Diagnosis not present

## 2015-07-17 DIAGNOSIS — C7931 Secondary malignant neoplasm of brain: Secondary | ICD-10-CM

## 2015-07-17 DIAGNOSIS — C3411 Malignant neoplasm of upper lobe, right bronchus or lung: Secondary | ICD-10-CM

## 2015-07-17 DIAGNOSIS — C3491 Malignant neoplasm of unspecified part of right bronchus or lung: Secondary | ICD-10-CM

## 2015-07-17 DIAGNOSIS — I10 Essential (primary) hypertension: Secondary | ICD-10-CM

## 2015-07-17 DIAGNOSIS — Z5111 Encounter for antineoplastic chemotherapy: Secondary | ICD-10-CM

## 2015-07-17 DIAGNOSIS — D509 Iron deficiency anemia, unspecified: Secondary | ICD-10-CM

## 2015-07-17 DIAGNOSIS — J449 Chronic obstructive pulmonary disease, unspecified: Secondary | ICD-10-CM

## 2015-07-17 LAB — COMPREHENSIVE METABOLIC PANEL
ALBUMIN: 2.7 g/dL — AB (ref 3.5–5.0)
ALK PHOS: 93 U/L (ref 40–150)
ALT: 10 U/L (ref 0–55)
ANION GAP: 10 meq/L (ref 3–11)
AST: 8 U/L (ref 5–34)
BUN: 7.4 mg/dL (ref 7.0–26.0)
CO2: 23 meq/L (ref 22–29)
Calcium: 10 mg/dL (ref 8.4–10.4)
Chloride: 105 mEq/L (ref 98–109)
Creatinine: 0.7 mg/dL (ref 0.6–1.1)
Glucose: 103 mg/dl (ref 70–140)
POTASSIUM: 3.7 meq/L (ref 3.5–5.1)
SODIUM: 138 meq/L (ref 136–145)
TOTAL PROTEIN: 7.4 g/dL (ref 6.4–8.3)

## 2015-07-17 LAB — CBC WITH DIFFERENTIAL/PLATELET
BASO%: 0.7 % (ref 0.0–2.0)
BASOS ABS: 0 10*3/uL (ref 0.0–0.1)
EOS ABS: 0 10*3/uL (ref 0.0–0.5)
EOS%: 0.5 % (ref 0.0–7.0)
HCT: 26.1 % — ABNORMAL LOW (ref 34.8–46.6)
HGB: 8.2 g/dL — ABNORMAL LOW (ref 11.6–15.9)
LYMPH%: 41.2 % (ref 14.0–49.7)
MCH: 24.7 pg — AB (ref 25.1–34.0)
MCHC: 31.6 g/dL (ref 31.5–36.0)
MCV: 78.3 fL — AB (ref 79.5–101.0)
MONO#: 1.1 10*3/uL — AB (ref 0.1–0.9)
MONO%: 16.3 % — AB (ref 0.0–14.0)
NEUT%: 41.3 % (ref 38.4–76.8)
NEUTROS ABS: 2.7 10*3/uL (ref 1.5–6.5)
PLATELETS: 475 10*3/uL — AB (ref 145–400)
RBC: 3.34 10*6/uL — AB (ref 3.70–5.45)
RDW: 21.4 % — ABNORMAL HIGH (ref 11.2–14.5)
WBC: 6.5 10*3/uL (ref 3.9–10.3)
lymph#: 2.7 10*3/uL (ref 0.9–3.3)

## 2015-07-17 MED ORDER — PALONOSETRON HCL INJECTION 0.25 MG/5ML
0.2500 mg | Freq: Once | INTRAVENOUS | Status: AC
  Administered 2015-07-17: 0.25 mg via INTRAVENOUS

## 2015-07-17 MED ORDER — PACLITAXEL PROTEIN-BOUND CHEMO INJECTION 100 MG
100.0000 mg/m2 | Freq: Once | INTRAVENOUS | Status: AC
  Administered 2015-07-17: 150 mg via INTRAVENOUS
  Filled 2015-07-17: qty 30

## 2015-07-17 MED ORDER — SODIUM CHLORIDE 0.9 % IV SOLN
474.6500 mg | Freq: Once | INTRAVENOUS | Status: AC
  Administered 2015-07-17: 470 mg via INTRAVENOUS
  Filled 2015-07-17: qty 47

## 2015-07-17 MED ORDER — SODIUM CHLORIDE 0.9 % IV SOLN
10.0000 mg | Freq: Once | INTRAVENOUS | Status: AC
  Administered 2015-07-17: 10 mg via INTRAVENOUS
  Filled 2015-07-17: qty 1

## 2015-07-17 MED ORDER — SODIUM CHLORIDE 0.9 % IV SOLN
Freq: Once | INTRAVENOUS | Status: AC
  Administered 2015-07-17: 13:00:00 via INTRAVENOUS

## 2015-07-17 MED ORDER — PALONOSETRON HCL INJECTION 0.25 MG/5ML
INTRAVENOUS | Status: AC
  Filled 2015-07-17: qty 5

## 2015-07-17 NOTE — Patient Instructions (Signed)
[place] Discharge Instructions for Patients Receiving Chemotherapy  Today you received the following chemotherapy agents; Abraxene and Carboplatin.   To help prevent nausea and vomiting after your treatment, we encourage you to take your nausea medication as directed.    If you develop nausea and vomiting that is not controlled by your nausea medication, call the clinic.   BELOW ARE SYMPTOMS THAT SHOULD BE REPORTED IMMEDIATELY:  *FEVER GREATER THAN 100.5 F  *CHILLS WITH OR WITHOUT FEVER  NAUSEA AND VOMITING THAT IS NOT CONTROLLED WITH YOUR NAUSEA MEDICATION  *UNUSUAL SHORTNESS OF BREATH  *UNUSUAL BRUISING OR BLEEDING  TENDERNESS IN MOUTH AND THROAT WITH OR WITHOUT PRESENCE OF ULCERS  *URINARY PROBLEMS  *BOWEL PROBLEMS  UNUSUAL RASH Items with * indicate a potential emergency and should be followed up as soon as possible.  Feel free to call the clinic you have any questions or concerns. The clinic phone number is [phone].  Please show the [place] at check-in to the Emergency Department and triage nurse.

## 2015-07-17 NOTE — Progress Notes (Signed)
[place] OFFICE PROGRESS NOTE   Diagnosis:  Lung cancer SUMMARY OF ONCOLOGIC HISTORY: Oncology History   Primary cancer of right lung metastatic to other site [facility])  Staging form: Lung, AJCC 7th Edition  Clinical stage from 05/14/2015: Stage IV (T1a, N3, M1b) - Signed by Truitt Merle, MD on 06/07/2015       Primary cancer of right lung metastatic to other site [facility])   05/11/2015 Imaging Brain MRI with and without contrast showed at least 6 subcentimeter supra tentorial cortical-based enhanced nodules compatible with nonhemorrhagic metastatic disease. Local edema without significant mass effect.   05/11/2015 Imaging CT chest, abdomen and pelvis with contrast showed right upper lobe nodule 1.7 cm, with right hilar, bilateral mediastinal, and the thoracic inlet adenopathy.   05/14/2015 Initial Diagnosis Primary cancer of right lung metastatic to other site [facility])   05/14/2015 Initial Biopsy Bronchoscopy and EBUS biopsy of 4R node showed non-small cell carcinoma, insufficient material for additional studies. The biopsy or over the right upper lobe lung nodule was negative.   05/22/2015 - 05/27/2015 Radiation Therapy SRS to brain metastases    06/05/2015 Miscellaneous PD-L1 IHC <1%    06/05/2015 Pathology Results Right upper lobe lung nodule core needle biopsy showed poorly differentiated non-small cell lung cancer with associated necrosis, tumor cells are positive for cytokeratin 5/6, suggesting squamous cell carcinoma.   06/05/2015 Procedure IR RUL lung nodule biopsy    06/19/2015 -  Chemotherapy Carboplatin AUC 5 on D1, and abraxane 150m/m2 on Day 1, 8 and 15, every 28 days         INTERVAL HISTORY:   Ms. SNipperreturns as scheduled. She began cycle 1 carboplatin/Abraxane 06/19/2015. She completed day 15 Abraxane 07/03/2015. She denies nausea/vomiting. No mouth sores. No diarrhea or constipation. No rash. No numbness or tingling in her hands or  feet. No shortness of breath. No chest pain. No bleeding.  She has not started oral iron. She did not realize it was over-the-counter.  Objective:  Vital signs in last 24 hours:  Blood pressure 128/51, pulse 96, temperature 98.3 F (36.8 C), temperature source Oral, resp. rate 20, height 5' 1"  (1.549 m), weight 123 lb 6.4 oz (55.974 kg), SpO2 100 %.    HEENT: No thrush or ulcers. Resp: Lungs clear bilaterally. Cardio: Regular rate and rhythm. GI: Abdomen soft and nontender. No hepatomegaly. Vascular: No leg edema. Calves soft and nontender. Neuro: Alert and oriented.  Skin: No rash.    Lab Results:  Lab Results  Component Value Date   WBC 6.5 07/17/2015   HGB 8.2* 07/17/2015   HCT 26.1* 07/17/2015   MCV 78.3* 07/17/2015   PLT 475* 07/17/2015   NEUTROABS 2.7 07/17/2015    Imaging:  No results found.  Medications: I have reviewed the patient's current medications.  Assessment/Plan: 1. 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 Anemia in neoplastic disease, iron deficiency. Oral iron recommended. 3. Hypertension, COPD. Follow up with PCP.   Disposition:Ms. SNeroappears stable. She has completed 1 cycle of carboplatin/Abraxane. She seems to tolerate the first cycle well. Plan to proceed with cycle 2 today as scheduled.  She has progressive anemia. She is not symptomatic. She has not started oral iron. She thought it required a prescription. She will obtain ferrous sulfate and begin taking it twice daily.  We discussed that she may need a blood transfusion if her hemoglobin is lower next week. She understands to contact the office with signs/symptoms suggestive of progressive anemia. These were reviewed with her at today's visit.  She will return for  the day 8 Abraxane in one week. She will return for a follow-up visit and day 15 Abraxane in 2 weeks. She will contact the office in the interim as outlined above or with any other problems.    Ned Card ANP/GNP-BC   07/17/2015  12:47 PM

## 2015-07-17 NOTE — Telephone Encounter (Signed)
CALLED PATIENT TO INFORM THAT EYE DR. APPT. HAS BEEN MOVED TO 07-21-15 - ARRIVAL TIME 7:45 AM, SPOKE WITH PATIENT AND SHE AGREED TO THIS DAY AND TIME

## 2015-07-17 NOTE — Telephone Encounter (Signed)
CALLED PATIENT TO ASK QUESTION, LVM FOR A RETURN CALL 

## 2015-07-21 ENCOUNTER — Other Ambulatory Visit: Payer: Self-pay | Admitting: Radiation Therapy

## 2015-07-21 DIAGNOSIS — C7949 Secondary malignant neoplasm of other parts of nervous system: Principal | ICD-10-CM

## 2015-07-21 DIAGNOSIS — C7931 Secondary malignant neoplasm of brain: Secondary | ICD-10-CM

## 2015-07-23 ENCOUNTER — Telehealth: Payer: Self-pay | Admitting: *Deleted

## 2015-07-23 NOTE — Telephone Encounter (Signed)
Called pt to discuss PAC.  Pt states she did well with IV start last Friday & will just see how it goes & if they start having problems with her IV's will set up PAC.

## 2015-07-24 ENCOUNTER — Ambulatory Visit (HOSPITAL_BASED_OUTPATIENT_CLINIC_OR_DEPARTMENT_OTHER): Payer: BLUE CROSS/BLUE SHIELD

## 2015-07-24 ENCOUNTER — Other Ambulatory Visit: Payer: Self-pay | Admitting: *Deleted

## 2015-07-24 ENCOUNTER — Ambulatory Visit (HOSPITAL_COMMUNITY)
Admission: RE | Admit: 2015-07-24 | Discharge: 2015-07-24 | Disposition: A | Discharge status: Home / Self Care | Payer: BLUE CROSS/BLUE SHIELD | Source: Ambulatory Visit | Attending: Hematology | Admitting: Hematology

## 2015-07-24 ENCOUNTER — Other Ambulatory Visit (HOSPITAL_BASED_OUTPATIENT_CLINIC_OR_DEPARTMENT_OTHER): Payer: BLUE CROSS/BLUE SHIELD

## 2015-07-24 VITALS — BP 121/54 | HR 101 | Temp 98.4°F

## 2015-07-24 DIAGNOSIS — D630 Anemia in neoplastic disease: Secondary | ICD-10-CM

## 2015-07-24 DIAGNOSIS — C3491 Malignant neoplasm of unspecified part of right bronchus or lung: Secondary | ICD-10-CM | POA: Diagnosis not present

## 2015-07-24 DIAGNOSIS — Z5111 Encounter for antineoplastic chemotherapy: Secondary | ICD-10-CM | POA: Diagnosis not present

## 2015-07-24 DIAGNOSIS — D509 Iron deficiency anemia, unspecified: Secondary | ICD-10-CM

## 2015-07-24 DIAGNOSIS — C7931 Secondary malignant neoplasm of brain: Secondary | ICD-10-CM

## 2015-07-24 LAB — COMPREHENSIVE METABOLIC PANEL
ALBUMIN: 2.9 g/dL — AB (ref 3.5–5.0)
ALT: 9 U/L (ref 0–55)
ANION GAP: 12 meq/L — AB (ref 3–11)
AST: 11 U/L (ref 5–34)
Alkaline Phosphatase: 98 U/L (ref 40–150)
BUN: 10.5 mg/dL (ref 7.0–26.0)
CO2: 23 mEq/L (ref 22–29)
CREATININE: 0.8 mg/dL (ref 0.6–1.1)
Calcium: 10.2 mg/dL (ref 8.4–10.4)
Chloride: 102 mEq/L (ref 98–109)
GLUCOSE: 96 mg/dL (ref 70–140)
POTASSIUM: 3.7 meq/L (ref 3.5–5.1)
SODIUM: 137 meq/L (ref 136–145)
Total Protein: 7.9 g/dL (ref 6.4–8.3)

## 2015-07-24 LAB — CBC WITH DIFFERENTIAL/PLATELET
BASO%: 1.2 % (ref 0.0–2.0)
BASOS ABS: 0.1 10*3/uL (ref 0.0–0.1)
EOS ABS: 0 10*3/uL (ref 0.0–0.5)
EOS%: 0.3 % (ref 0.0–7.0)
HCT: 23.3 % — ABNORMAL LOW (ref 34.8–46.6)
HEMOGLOBIN: 7.5 g/dL — AB (ref 11.6–15.9)
LYMPH%: 25.8 % (ref 14.0–49.7)
MCH: 24.8 pg — AB (ref 25.1–34.0)
MCHC: 32.2 g/dL (ref 31.5–36.0)
MCV: 77.2 fL — AB (ref 79.5–101.0)
MONO#: 0.8 10*3/uL (ref 0.1–0.9)
MONO%: 11.1 % (ref 0.0–14.0)
NEUT#: 4.2 10*3/uL (ref 1.5–6.5)
NEUT%: 61.6 % (ref 38.4–76.8)
Platelets: 489 10*3/uL — ABNORMAL HIGH (ref 145–400)
RBC: 3.01 10*6/uL — AB (ref 3.70–5.45)
RDW: 20.9 % — ABNORMAL HIGH (ref 11.2–14.5)
WBC: 6.8 10*3/uL (ref 3.9–10.3)
lymph#: 1.8 10*3/uL (ref 0.9–3.3)

## 2015-07-24 MED ORDER — SODIUM CHLORIDE 0.9 % IV SOLN
Freq: Once | INTRAVENOUS | Status: AC
Start: 2015-07-24 — End: 2015-07-24
  Administered 2015-07-24: 09:00:00 via INTRAVENOUS

## 2015-07-24 MED ORDER — PROCHLORPERAZINE MALEATE 10 MG PO TABS
10.0000 mg | ORAL_TABLET | Freq: Once | ORAL | Status: AC
  Administered 2015-07-24: 10 mg via ORAL

## 2015-07-24 MED ORDER — PROCHLORPERAZINE MALEATE 10 MG PO TABS
ORAL_TABLET | ORAL | Status: AC
  Filled 2015-07-24: qty 1

## 2015-07-24 MED ORDER — PACLITAXEL PROTEIN-BOUND CHEMO INJECTION 100 MG
100.0000 mg/m2 | Freq: Once | INTRAVENOUS | Status: AC
  Administered 2015-07-24: 150 mg via INTRAVENOUS
  Filled 2015-07-24: qty 30

## 2015-07-24 NOTE — Progress Notes (Signed)
Ok to treat per Dr. Lindi Adie. Add iron studies with next lab draw.

## 2015-07-24 NOTE — Patient Instructions (Signed)
[facility] Discharge Instructions for Patients Receiving Chemotherapy  Today you received the following chemotherapy agents Abraxane To help prevent nausea and vomiting after your treatment, we encourage you to take your nausea medication as prescribed.   If you develop nausea and vomiting that is not controlled by your nausea medication, call the clinic.   BELOW ARE SYMPTOMS THAT SHOULD BE REPORTED IMMEDIATELY:  *FEVER GREATER THAN 100.5 F  *CHILLS WITH OR WITHOUT FEVER  NAUSEA AND VOMITING THAT IS NOT CONTROLLED WITH YOUR NAUSEA MEDICATION  *UNUSUAL SHORTNESS OF BREATH  *UNUSUAL BRUISING OR BLEEDING  TENDERNESS IN MOUTH AND THROAT WITH OR WITHOUT PRESENCE OF ULCERS  *URINARY PROBLEMS  *BOWEL PROBLEMS  UNUSUAL RASH Items with * indicate a potential emergency and should be followed up as soon as possible.  Feel free to call the clinic you have any questions or concerns. The clinic phone number is [phone].  Please show the CHEMO ALERT CARD at check-in to the Emergency Department and triage nurse.   

## 2015-07-31 ENCOUNTER — Telehealth: Payer: Self-pay | Admitting: *Deleted

## 2015-07-31 ENCOUNTER — Other Ambulatory Visit (HOSPITAL_BASED_OUTPATIENT_CLINIC_OR_DEPARTMENT_OTHER): Payer: BLUE CROSS/BLUE SHIELD

## 2015-07-31 ENCOUNTER — Telehealth: Payer: Self-pay | Admitting: Hematology

## 2015-07-31 ENCOUNTER — Ambulatory Visit (HOSPITAL_BASED_OUTPATIENT_CLINIC_OR_DEPARTMENT_OTHER): Payer: BLUE CROSS/BLUE SHIELD | Admitting: Hematology

## 2015-07-31 ENCOUNTER — Encounter: Payer: Self-pay | Admitting: Hematology

## 2015-07-31 ENCOUNTER — Ambulatory Visit (HOSPITAL_BASED_OUTPATIENT_CLINIC_OR_DEPARTMENT_OTHER): Payer: BLUE CROSS/BLUE SHIELD

## 2015-07-31 VITALS — BP 115/54 | HR 107 | Temp 98.6°F | Resp 17 | Ht 61.0 in | Wt 122.4 lb

## 2015-07-31 DIAGNOSIS — C3411 Malignant neoplasm of upper lobe, right bronchus or lung: Secondary | ICD-10-CM

## 2015-07-31 DIAGNOSIS — C7931 Secondary malignant neoplasm of brain: Secondary | ICD-10-CM

## 2015-07-31 DIAGNOSIS — C3491 Malignant neoplasm of unspecified part of right bronchus or lung: Secondary | ICD-10-CM

## 2015-07-31 DIAGNOSIS — D630 Anemia in neoplastic disease: Secondary | ICD-10-CM | POA: Diagnosis not present

## 2015-07-31 DIAGNOSIS — D509 Iron deficiency anemia, unspecified: Secondary | ICD-10-CM | POA: Diagnosis not present

## 2015-07-31 LAB — IRON AND TIBC
%SAT: 7 % — ABNORMAL LOW (ref 21–57)
Iron: 18 ug/dL — ABNORMAL LOW (ref 41–142)
TIBC: 253 ug/dL (ref 236–444)
UIBC: 234 ug/dL (ref 120–384)

## 2015-07-31 LAB — COMPREHENSIVE METABOLIC PANEL
ALK PHOS: 124 U/L (ref 40–150)
ALT: <9 U/L (ref 0–55)
ANION GAP: 12 meq/L — AB (ref 3–11)
AST: 11 U/L (ref 5–34)
Albumin: 3 g/dL — ABNORMAL LOW (ref 3.5–5.0)
BUN: 6.8 mg/dL — ABNORMAL LOW (ref 7.0–26.0)
CHLORIDE: 104 meq/L (ref 98–109)
CO2: 22 meq/L (ref 22–29)
Calcium: 10.6 mg/dL — ABNORMAL HIGH (ref 8.4–10.4)
Creatinine: 0.8 mg/dL (ref 0.6–1.1)
GLUCOSE: 82 mg/dL (ref 70–140)
Potassium: 3.8 mEq/L (ref 3.5–5.1)
SODIUM: 138 meq/L (ref 136–145)
Total Protein: 8 g/dL (ref 6.4–8.3)

## 2015-07-31 LAB — CBC WITH DIFFERENTIAL/PLATELET
BASO%: 1.2 % (ref 0.0–2.0)
Basophils Absolute: 0.1 10*3/uL (ref 0.0–0.1)
EOS ABS: 0.1 10*3/uL (ref 0.0–0.5)
EOS%: 1.2 % (ref 0.0–7.0)
HCT: 24.7 % — ABNORMAL LOW (ref 34.8–46.6)
HGB: 7.6 g/dL — ABNORMAL LOW (ref 11.6–15.9)
LYMPH%: 34.3 % (ref 14.0–49.7)
MCH: 24.9 pg — ABNORMAL LOW (ref 25.1–34.0)
MCHC: 30.8 g/dL — AB (ref 31.5–36.0)
MCV: 81 fL (ref 79.5–101.0)
MONO#: 0.8 10*3/uL (ref 0.1–0.9)
MONO%: 17.8 % — AB (ref 0.0–14.0)
NEUT#: 2 10*3/uL (ref 1.5–6.5)
NEUT%: 45.5 % (ref 38.4–76.8)
PLATELETS: 342 10*3/uL (ref 145–400)
RBC: 3.05 10*6/uL — AB (ref 3.70–5.45)
RDW: 19.7 % — ABNORMAL HIGH (ref 11.2–14.5)
WBC: 4.3 10*3/uL (ref 3.9–10.3)
lymph#: 1.5 10*3/uL (ref 0.9–3.3)

## 2015-07-31 LAB — FERRITIN: Ferritin: 336 ng/ml — ABNORMAL HIGH (ref 9–269)

## 2015-07-31 MED ORDER — PROCHLORPERAZINE MALEATE 10 MG PO TABS
10.0000 mg | ORAL_TABLET | Freq: Once | ORAL | Status: AC
  Administered 2015-07-31: 10 mg via ORAL

## 2015-07-31 MED ORDER — PACLITAXEL PROTEIN-BOUND CHEMO INJECTION 100 MG
75.0000 mg/m2 | Freq: Once | INTRAVENOUS | Status: AC
  Administered 2015-07-31: 125 mg via INTRAVENOUS
  Filled 2015-07-31: qty 25

## 2015-07-31 MED ORDER — SODIUM CHLORIDE 0.9 % IV SOLN
Freq: Once | INTRAVENOUS | Status: AC
  Administered 2015-07-31: 10:00:00 via INTRAVENOUS

## 2015-07-31 MED ORDER — PROCHLORPERAZINE MALEATE 10 MG PO TABS
ORAL_TABLET | ORAL | Status: AC
  Filled 2015-07-31: qty 1

## 2015-07-31 NOTE — Telephone Encounter (Signed)
per pof to sch pt appt-MW sch trmt-pt to get updated copy b4 leaving

## 2015-07-31 NOTE — Telephone Encounter (Signed)
Per staff message and POF I have scheduled appts. Advised scheduler of appts. JMW  

## 2015-07-31 NOTE — Progress Notes (Signed)
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 Local edema without significant mass effect.   05/11/2015 Imaging CT chest, abdomen and pelvis with contrast showed right upper lobe nodule 1.7 cm, with right hilar, bilateral mediastinal, and the thoracic inlet adenopathy.   05/14/2015 Initial Diagnosis Primary cancer of right lung metastatic to other site [facility])   05/14/2015 Initial Biopsy Bronchoscopy and EBUS biopsy of 4R node showed non-small cell carcinoma, insufficient material for additional studies. The biopsy or over the right upper lobe lung nodule was negative.   05/22/2015 - 05/27/2015 Radiation Therapy SRS to brain metastases    06/05/2015 Miscellaneous PD-L1 IHC <1%    06/05/2015 Pathology Results Right upper lobe lung nodule core needle biopsy showed poorly differentiated non-small cell lung cancer with associated necrosis, tumor cells are positive for cytokeratin 5/6, suggesting squamous cell carcinoma.   06/05/2015 Procedure IR RUL lung nodule biopsy    06/19/2015 -  Chemotherapy Carboplatin AUC 5 on D1,  and abraxane 141m/m2 on Day 1, 8 and 15, every 28 days    History of present illness (05/12/2015)  Patient is a 65year old female with past medical history of hypertension, 30 year smoking history, remote history of breast cancer in 1992, presented with sudden  onset episodes of difficulty speaking, drooling, and facial twitching on 09/09/2015. It lasted about 7-8 minutes. EMS was called and she was brought to the hospital. Brain scan showed multiple brain lesions, highly suspicious for metastatic disease. CT chest abdomen and pelvis revealed a 2.3 cm right upper lobe lung nodule, along was hilar and mediastinal adenopathy. Pulmonary was consulted for biopsy. I saw her in the hospital, along with 2 of her granddaughters. She feels well now, denies any significant pain, dyspnea, or other symptoms. She does have mild chronic cough nonproductive. She states that her appetite has been low and she lost about 10-15 pounds in the past year. 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 She has 2 sisters and graduations in the area.  CURRENT THERAPY: Carboplatin AUC 5 on day 1, Abraxane 100 mg/m on day 1, 8 and 15, every 28 days, started on 06/19/2015  INTERVAL HISTORY:  Mrs. SSlomskireturns for follow-up and cycle 2 day 15 treatment. She has tolerated the chemotherapy well overall, slightly more fatigued lately, able to tolerate her routine activities and her part-time job, no chest pain, or dyspnea on exertion. She function well at home. She denies significant pain, cough, or other symptoms. She has a mild numbness of her fingers, no tingling or pain. No neuropathy on her feet. No other neurological symptoms.  REVIEW OF SYSTEMS:   Constitutional: Denies fevers, chills or abnormal weight loss Eyes: Denies blurriness of vision Ears, nose, mouth, throat, and face: Denies mucositis or sore throat Respiratory: Denies cough, dyspnea or wheezes Cardiovascular: Denies palpitation, chest discomfort or lower extremity  swelling Gastrointestinal:  Denies nausea, heartburn or change in bowel habits Skin: Denies abnormal skin rashes Lymphatics: Denies new lymphadenopathy or easy bruising Neurological:Denies numbness, tingling or new  weaknesses Behavioral/Psych: Mood is stable, no new changes  All other systems were reviewed with the patient and are negative.  MEDICAL HISTORY:  Past Medical History  Diagnosis Date  . Hypertension   . Hyperlipidemia   . Family history of adverse reaction to anesthesia     "my son passed away I think related to the anesthesia"  . Asthmatic bronchitis   . Chronic bronchitis ([place])   . Sinus headache   . Seizures ([place]) 05/10/2015    "it may have been related to me taking Cipro" (05/11/2015)  . Brain cancer ([place])   . Breast cancer [facility])     SURGICAL HISTORY: Past Surgical History  Procedure Laterality Date  . Appendectomy    . Abdominal hysterectomy    . Laparoscopic cholecystectomy    . Breast biopsy Right   . Breast lumpectomy Right   . Video bronchoscopy with endobronchial navigation N/A 05/14/2015    Procedure: VIDEO BRONCHOSCOPY WITH ENDOBRONCHIAL NAVIGATION;  Surgeon: Collene Gobble, MD;  Location: [place];  Service: Thoracic;  Laterality: N/A;  . Video bronchoscopy with endobronchial ultrasound N/A 05/14/2015    Procedure: VIDEO BRONCHOSCOPY WITH ENDOBRONCHIAL ULTRASOUND;  Surgeon: Collene Gobble, MD;  Location: [place];  Service: Thoracic;  Laterality: N/A;    ALLERGIES:  has No Known Allergies.  MEDICATIONS:  Current Outpatient Prescriptions  Medication Sig Dispense Refill  . aspirin EC 81 MG tablet Take 81 mg by mouth daily.    Marland Kitchen atenolol (TENORMIN) 50 MG tablet Take 50 mg by mouth daily. Reported on 07/13/2015    . cholecalciferol (VITAMIN D) 1000 units tablet Take 1 tablet (1,000 Units total) by mouth daily. 180 tablet 6  . lactobacillus acidophilus (BACID) TABS tablet Take 1 tablet by mouth daily.     Marland Kitchen levETIRAcetam (KEPPRA) 100 MG/ML solution Take 5 mLs (500 mg total) by mouth 2 (two) times daily. 473 mL 11  . LORazepam (ATIVAN) 0.5 MG tablet Take 1 tablet (0.5 mg total) by mouth every 6 (six) hours as needed (Nausea or vomiting). 30 tablet 0  .  losartan-hydrochlorothiazide (HYZAAR) 50-12.5 MG tablet Take 1 tablet by mouth daily. Reported on 07/17/2015    . pravastatin (PRAVACHOL) 20 MG tablet Take 20 mg by mouth daily. Reported on 07/17/2015    . prochlorperazine (COMPAZINE) 10 MG tablet Take 1 tablet (10 mg total) by mouth every 6 (six) hours as needed (Nausea or vomiting). 30 tablet 1  . RESTASIS 0.05 % ophthalmic emulsion Place 1 drop into both eyes 2 (two) times daily.  3   No current facility-administered medications for this visit.   Facility-Administered Medications Ordered in Other Visits  Medication Dose Route Frequency Provider Last Rate Last Dose  . 0.9 %  sodium chloride infusion   Intravenous Once Truitt Merle, MD      . PACLitaxel-protein bound (ABRAXANE) chemo infusion 125 mg  75 mg/m2 (Treatment Plan Actual) Intravenous Once Truitt Merle, MD      . prochlorperazine (COMPAZINE) tablet 10 mg  10 mg Oral Once Truitt Merle, MD        PHYSICAL EXAMINATION: ECOG PERFORMANCE STATUS: 0 - Asymptomatic  Filed Vitals:   07/31/15 0825  BP: 115/54  Pulse: 107  Temp: 98.6 F (37 C)  Resp: 17   Filed Weights   07/31/15 0825  Weight: 122 lb  6.4 oz (55.52 kg)    GENERAL:alert, no distress and comfortable SKIN: skin color, texture, turgor are normal, no rashes or significant lesions EYES: normal, Conjunctiva are pink and non-injected, sclera clear OROPHARYNX:no exudate, no erythema and lips, buccal mucosa, and tongue normal  NECK: supple, thyroid normal size, non-tender, without nodularity LYMPH:  no palpable lymphadenopathy in the cervical, axillary or inguinal LUNGS: clear to auscultation and percussion with normal breathing effort HEART: regular rate & rhythm and no murmurs and no lower extremity edema ABDOMEN:abdomen soft, non-tender and normal bowel sounds Musculoskeletal:no cyanosis of digits and no clubbing  NEURO: alert & oriented x 3 with fluent speech, no focal motor/sensory deficits  LABORATORY DATA:  I have reviewed  the data as listed CBC Latest Ref Rng 07/31/2015 07/24/2015 07/17/2015  WBC 3.9 - 10.3 10e3/uL 4.3 6.8 6.5  Hemoglobin 11.6 - 15.9 g/dL 7.6(L) 7.5(L) 8.2(L)  Hematocrit 34.8 - 46.6 % 24.7(L) 23.3(L) 26.1(L)  Platelets 145 - 400 10e3/uL 342 489(H) 475(H)     CMP Latest Ref Rng 07/31/2015 07/24/2015 07/17/2015  Glucose 70 - 140 mg/dl 82 96 103  BUN 7.0 - 26.0 mg/dL 6.8(L) 10.5 7.4  Creatinine 0.6 - 1.1 mg/dL 0.8 0.8 0.7  Sodium 136 - 145 mEq/L 138 137 138  Potassium 3.5 - 5.1 mEq/L 3.8 3.7 3.7  CO2 22 - 29 mEq/L 22 23 23   Calcium 8.4 - 10.4 mg/dL 10.6(H) 10.2 10.0  Total Protein 6.4 - 8.3 g/dL 8.0 7.9 7.4  Total Bilirubin 0.20 - 1.20 mg/dL <0.30 <0.30 <0.30  Alkaline Phos 40 - 150 U/L 124 98 93  AST 5 - 34 U/L 11 11 8   ALT 0 - 55 U/L <9 9 10    PATHOLOGY REPORT Diagnosis 05/14/2015  Lung, biopsy, Right upper lobe - MILDLY INFLAMED LUNG PARENCHYMA. - THERE IS NO EVIDENCE OF MALIGNANCY.  Diagnosis 05/14/2015  FINE NEEDLE ASPIRATION: EBUS, 4R, C (SPECIMEN 3 OF , COLLECTED ON 05/14/15): MALIGNANT CELLS PRESENT, CONSISTENT WITH NON SMALL CELL CARCINOMA. SEE COMMENT. COMMENT: THERE IS INSUFFICIENT MATERIAL PRESENT FOR ADDITIONAL STUDIES.  Diagnosis 06/05/2015 Lung, needle/core biopsy(ies), right upper lobe - POORLY DIFFERENTIATED NON SMALL CELL CARCINOMA WITH ASSOCIATED NECROSIS. - SEE COMMENT. Microscopic Comment In lieu of immunohistochemical stains, tissue will be preserved for additional studies, if requested. Dr. Burr Medico was paged on 06/08/15. (JBK:gt, 06/08/15)  ADDITIONAL INFORMATION: The tumor cells are positive for cyto5/5/2017keratin 5/6, suggesting squamous cell carcinoma. Per clinician request, a block will be sent for PDL-1 testing and results reported separately. (JBK:gt, 06/09/15)   RADIOGRAPHIC STUDIES: I have personally reviewed the radiological images as listed and agreed with the findings in the report.  CT Chest, abdomen and pelvis with contrast 05/11/2015 IMPRESSION: 1.  Right upper lobe nodule with right hilar, bilateral mediastinal, and thoracic inlet adenopathy. 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 2. History of breast cancer with right axillary dissection. 3. Left nephrectomy for uncertain indication.  MRI brain w wo contrast 05/11/2015 IMPRESSION: At least 6 subcentimeter supratentorial predominately cortical based enhancing nodules compatible with nonhemorrhagic metastatic disease. Local edema without significant mass effect.  Moderate chronic small vessel ischemic disease without acute ischemia.  PET 06/10/2015 IMPRESSION: 1. Hypermetabolic 2.7 cm peripheral right upper lobe pulmonary nodule, consistent with primary bronchogenic carcinoma. 2. Ipsilateral peribronchial, ipsilateral mediastinal and right level 4 neck nodal metastases. 3. Asymmetric foci of hypermetabolism in the left inferior frontal lobe and right cerebellar hemisphere, cannot exclude intra-axial brain metastases in these locations in this patient with known  brain metastases. 4. Hypermetabolic thyroid isthmus nodule, for which a significant minority will represent thyroid malignancy. Correlate with thyroid ultrasound and ultrasound-guided fine-needle aspiration as clinically warranted. 5. No hypermetabolic metastatic disease in the abdomen, pelvis or skeleton. 6. Coronary atherosclerosis.  ASSESSMENT & PLAN:  A 65 year old female with past medical history of hypertension, 15 pack year history of smoking, presented with seizure like activity, images studies reviewed multiple (at least 6) brain lesions and a 2.3 cm right upper lobe lung nodule, with hilar and mediastinal adenopathy  1. 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  - her repeated [place] biopsy showed poor differentiated  squamous cell carcinoma,  PET scan showed hypermetabolic hilar, mediastinum, and  Right neck level IV nodal metastasis. - her brain metastasis has been treated with SRS , she is clinically doing very well, her dexamethasone will be tapered off gradually by Dr. Lisbeth Renshaw. - I reviewed the natural history and incurable nature of her metastatic squamous cell lung cancer,  Her overall prognosis is very poor giving the metastatic disease. - The goal of therapy is palliative and prolong her life. -Her tumor PD-L1<1%, unfortunately first line immunotherapy is not indicated. -She is currently on first-line chemotherapy with carbo and Abraxane, tolerating well, we'll continue. -She has developed worsening anemia, although not very somatic, she also has moderate fatigue especially on week 3 of chemotherapy, I'll decrease her Abraxane dose to 75 mg/m today (for D15 only)  -Plan to repeat staging scan in 1-2 weeks    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 She probably has a component of iron deficient anemia also -Giving her moderate anemia, and she does not want a blood transfusion, I recommend IV Feraheme in the next few weeks. Benefit and the potential side effects, especially infusion reactions including anaphylactic reaction, discussed with patient and she agrees to proceed. -She will continue oral iron supplement  3. HTN, COPD -she will continue follow up with her PCP   Plan -Lab reviewed, adequate for treatment, due to the moderate anemia and fatigue, I'll decrease Abraxane to 75 mg/m today -IV Feraheme 510 mg weekly 2, in the next 2 weeks -Restaging CT chest, abdomen and pelvis with contrast in the next 1-2 weeks -I'll see her back in 2 weeks before cycle 3 chemo   All questions were answered. The patient knows to  call the clinic with any problems, questions or concerns.    Truitt Merle, MD 07/31/2015

## 2015-07-31 NOTE — Patient Instructions (Signed)
[facility] Discharge Instructions for Patients Receiving Chemotherapy  Today you received the following chemotherapy agents Abraxane To help prevent nausea and vomiting after your treatment, we encourage you to take your nausea medication as prescribed.   If you develop nausea and vomiting that is not controlled by your nausea medication, call the clinic.   BELOW ARE SYMPTOMS THAT SHOULD BE REPORTED IMMEDIATELY:  *FEVER GREATER THAN 100.5 F  *CHILLS WITH OR WITHOUT FEVER  NAUSEA AND VOMITING THAT IS NOT CONTROLLED WITH YOUR NAUSEA MEDICATION  *UNUSUAL SHORTNESS OF BREATH  *UNUSUAL BRUISING OR BLEEDING  TENDERNESS IN MOUTH AND THROAT WITH OR WITHOUT PRESENCE OF ULCERS  *URINARY PROBLEMS  *BOWEL PROBLEMS  UNUSUAL RASH Items with * indicate a potential emergency and should be followed up as soon as possible.  Feel free to call the clinic you have any questions or concerns. The clinic phone number is [phone].  Please show the CHEMO ALERT CARD at check-in to the Emergency Department and triage nurse.   

## 2015-07-31 NOTE — Progress Notes (Signed)
Ok to treat with pt current cbc today per Dr. Burr Medico. Will dose reduce abraxane today.

## 2015-08-01 LAB — VITAMIN B12: Vitamin B12: 263 pg/mL (ref 211–946)

## 2015-08-03 LAB — ACID FAST CULTURE WITH REFLEXED SENSITIVITIES: ACID FAST CULTURE - AFSCU3: NEGATIVE

## 2015-08-06 ENCOUNTER — Ambulatory Visit (HOSPITAL_BASED_OUTPATIENT_CLINIC_OR_DEPARTMENT_OTHER): Payer: BLUE CROSS/BLUE SHIELD

## 2015-08-06 VITALS — BP 115/68 | HR 99 | Temp 98.1°F | Resp 16

## 2015-08-06 DIAGNOSIS — D509 Iron deficiency anemia, unspecified: Secondary | ICD-10-CM

## 2015-08-06 MED ORDER — SODIUM CHLORIDE 0.9 % IV SOLN
Freq: Once | INTRAVENOUS | Status: AC
  Administered 2015-08-06: 08:00:00 via INTRAVENOUS

## 2015-08-06 MED ORDER — SODIUM CHLORIDE 0.9 % IV SOLN
510.0000 mg | Freq: Once | INTRAVENOUS | Status: AC
  Administered 2015-08-06: 510 mg via INTRAVENOUS
  Filled 2015-08-06: qty 17

## 2015-08-06 NOTE — Patient Instructions (Signed)

## 2015-08-10 ENCOUNTER — Encounter (HOSPITAL_COMMUNITY): Payer: Self-pay

## 2015-08-10 ENCOUNTER — Ambulatory Visit (HOSPITAL_COMMUNITY)
Admission: RE | Admit: 2015-08-10 | Discharge: 2015-08-10 | Disposition: A | Discharge status: Home / Self Care | Payer: BLUE CROSS/BLUE SHIELD | Source: Ambulatory Visit | Attending: Hematology | Admitting: Hematology

## 2015-08-10 DIAGNOSIS — I700 Atherosclerosis of aorta: Secondary | ICD-10-CM | POA: Diagnosis not present

## 2015-08-10 DIAGNOSIS — R590 Localized enlarged lymph nodes: Secondary | ICD-10-CM | POA: Diagnosis not present

## 2015-08-10 DIAGNOSIS — I2510 Atherosclerotic heart disease of native coronary artery without angina pectoris: Secondary | ICD-10-CM | POA: Diagnosis not present

## 2015-08-10 DIAGNOSIS — C3491 Malignant neoplasm of unspecified part of right bronchus or lung: Secondary | ICD-10-CM | POA: Insufficient documentation

## 2015-08-10 MED ORDER — IOPAMIDOL (ISOVUE-300) INJECTION 61%
100.0000 mL | Freq: Once | INTRAVENOUS | Status: AC | PRN
  Administered 2015-08-10: 100 mL via INTRAVENOUS

## 2015-08-12 ENCOUNTER — Ambulatory Visit (HOSPITAL_BASED_OUTPATIENT_CLINIC_OR_DEPARTMENT_OTHER): Payer: BLUE CROSS/BLUE SHIELD

## 2015-08-12 ENCOUNTER — Encounter: Payer: Self-pay | Admitting: Hematology

## 2015-08-12 ENCOUNTER — Other Ambulatory Visit (HOSPITAL_BASED_OUTPATIENT_CLINIC_OR_DEPARTMENT_OTHER): Payer: BLUE CROSS/BLUE SHIELD

## 2015-08-12 ENCOUNTER — Ambulatory Visit (HOSPITAL_BASED_OUTPATIENT_CLINIC_OR_DEPARTMENT_OTHER): Payer: BLUE CROSS/BLUE SHIELD | Admitting: Hematology

## 2015-08-12 VITALS — BP 118/65 | HR 95 | Temp 99.0°F | Resp 16

## 2015-08-12 VITALS — BP 151/72 | HR 106 | Temp 98.0°F | Resp 18 | Ht 61.0 in | Wt 123.0 lb

## 2015-08-12 DIAGNOSIS — D509 Iron deficiency anemia, unspecified: Secondary | ICD-10-CM

## 2015-08-12 DIAGNOSIS — C3411 Malignant neoplasm of upper lobe, right bronchus or lung: Secondary | ICD-10-CM

## 2015-08-12 DIAGNOSIS — C7931 Secondary malignant neoplasm of brain: Secondary | ICD-10-CM | POA: Diagnosis not present

## 2015-08-12 DIAGNOSIS — G622 Polyneuropathy due to other toxic agents: Secondary | ICD-10-CM

## 2015-08-12 DIAGNOSIS — C779 Secondary and unspecified malignant neoplasm of lymph node, unspecified: Secondary | ICD-10-CM

## 2015-08-12 DIAGNOSIS — I10 Essential (primary) hypertension: Secondary | ICD-10-CM

## 2015-08-12 DIAGNOSIS — C3491 Malignant neoplasm of unspecified part of right bronchus or lung: Secondary | ICD-10-CM

## 2015-08-12 DIAGNOSIS — Z5111 Encounter for antineoplastic chemotherapy: Secondary | ICD-10-CM

## 2015-08-12 DIAGNOSIS — J449 Chronic obstructive pulmonary disease, unspecified: Secondary | ICD-10-CM

## 2015-08-12 DIAGNOSIS — D630 Anemia in neoplastic disease: Secondary | ICD-10-CM

## 2015-08-12 LAB — COMPREHENSIVE METABOLIC PANEL
ALT: <9 U/L (ref 0–55)
ANION GAP: 11 meq/L (ref 3–11)
AST: 10 U/L (ref 5–34)
Albumin: 3.1 g/dL — ABNORMAL LOW (ref 3.5–5.0)
Alkaline Phosphatase: 120 U/L (ref 40–150)
BUN: 5.7 mg/dL — ABNORMAL LOW (ref 7.0–26.0)
CALCIUM: 10.5 mg/dL — AB (ref 8.4–10.4)
CHLORIDE: 102 meq/L (ref 98–109)
CO2: 25 mEq/L (ref 22–29)
CREATININE: 0.7 mg/dL (ref 0.6–1.1)
Glucose: 89 mg/dl (ref 70–140)
POTASSIUM: 3.5 meq/L (ref 3.5–5.1)
Sodium: 138 mEq/L (ref 136–145)
Total Bilirubin: <0.3 mg/dL (ref 0.20–1.20)
Total Protein: 8.2 g/dL (ref 6.4–8.3)

## 2015-08-12 LAB — CBC WITH DIFFERENTIAL/PLATELET
BASO%: 0.6 % (ref 0.0–2.0)
BASOS ABS: 0 10*3/uL (ref 0.0–0.1)
EOS%: 0.5 % (ref 0.0–7.0)
Eosinophils Absolute: 0 10*3/uL (ref 0.0–0.5)
HEMATOCRIT: 25.5 % — AB (ref 34.8–46.6)
HGB: 8.1 g/dL — ABNORMAL LOW (ref 11.6–15.9)
LYMPH#: 1.4 10*3/uL (ref 0.9–3.3)
LYMPH%: 26.7 % (ref 14.0–49.7)
MCH: 26.2 pg (ref 25.1–34.0)
MCHC: 31.8 g/dL (ref 31.5–36.0)
MCV: 82.4 fL (ref 79.5–101.0)
MONO#: 0.6 10*3/uL (ref 0.1–0.9)
MONO%: 11.4 % (ref 0.0–14.0)
NEUT#: 3.1 10*3/uL (ref 1.5–6.5)
NEUT%: 60.8 % (ref 38.4–76.8)
PLATELETS: 270 10*3/uL (ref 145–400)
RBC: 3.1 10*6/uL — ABNORMAL LOW (ref 3.70–5.45)
RDW: 22.6 % — ABNORMAL HIGH (ref 11.2–14.5)
WBC: 5.2 10*3/uL (ref 3.9–10.3)

## 2015-08-12 MED ORDER — SODIUM CHLORIDE 0.9 % IV SOLN
Freq: Once | INTRAVENOUS | Status: AC
  Administered 2015-08-12: 13:00:00 via INTRAVENOUS

## 2015-08-12 MED ORDER — PALONOSETRON HCL INJECTION 0.25 MG/5ML
0.2500 mg | Freq: Once | INTRAVENOUS | Status: AC
  Administered 2015-08-12: 0.25 mg via INTRAVENOUS

## 2015-08-12 MED ORDER — SODIUM CHLORIDE 0.9 % IV SOLN
431.5000 mg | Freq: Once | INTRAVENOUS | Status: AC
  Administered 2015-08-12: 430 mg via INTRAVENOUS
  Filled 2015-08-12: qty 43

## 2015-08-12 MED ORDER — PALONOSETRON HCL INJECTION 0.25 MG/5ML
INTRAVENOUS | Status: AC
  Filled 2015-08-12: qty 5

## 2015-08-12 MED ORDER — PACLITAXEL PROTEIN-BOUND CHEMO INJECTION 100 MG
75.0000 mg/m2 | Freq: Once | INTRAVENOUS | Status: AC
  Administered 2015-08-12: 125 mg via INTRAVENOUS
  Filled 2015-08-12: qty 25

## 2015-08-12 MED ORDER — SODIUM CHLORIDE 0.9 % IV SOLN
10.0000 mg | Freq: Once | INTRAVENOUS | Status: AC
  Administered 2015-08-12: 10 mg via INTRAVENOUS
  Filled 2015-08-12: qty 1

## 2015-08-12 MED ORDER — SODIUM CHLORIDE 0.9 % IV SOLN
510.0000 mg | Freq: Once | INTRAVENOUS | Status: AC
  Administered 2015-08-12: 510 mg via INTRAVENOUS
  Filled 2015-08-12: qty 17

## 2015-08-12 NOTE — Patient Instructions (Addendum)
[facility] Discharge Instructions for Patients Receiving Chemotherapy  Today you received the following chemotherapy agents Abraxane & Carboplatin  To help prevent nausea and vomiting after your treatment, we encourage you to take your nausea medication. No Zofran for 3 days. Take Compazine for nausea instead.   If you develop nausea and vomiting that is not controlled by your nausea medication, call the clinic.   BELOW ARE SYMPTOMS THAT SHOULD BE REPORTED IMMEDIATELY:  *FEVER GREATER THAN 100.5 F  *CHILLS WITH OR WITHOUT FEVER  NAUSEA AND VOMITING THAT IS NOT CONTROLLED WITH YOUR NAUSEA MEDICATION  *UNUSUAL SHORTNESS OF BREATH  *UNUSUAL BRUISING OR BLEEDING  TENDERNESS IN MOUTH AND THROAT WITH OR WITHOUT PRESENCE OF ULCERS  *URINARY PROBLEMS  *BOWEL PROBLEMS  UNUSUAL RASH Items with * indicate a potential emergency and should be followed up as soon as possible.  Feel free to call the clinic you have any questions or concerns. The clinic phone number is [phone].  Please show the [facility] at check-in to the Emergency Department and triage nurse. Ferumoxytol injection What is this medicine? FERUMOXYTOL is an iron complex. Iron is used to make healthy red blood cells, which carry oxygen and nutrients throughout the body. This medicine is used to treat iron deficiency anemia in people with chronic kidney disease. This medicine may be used for other purposes; ask your health care provider or pharmacist if you have questions. What should I tell my health care provider before I take this medicine? They need to know if you have any of these conditions: -anemia not caused by low iron levels -high levels of iron in the blood -magnetic resonance imaging (MRI) test scheduled -an unusual or allergic reaction to iron, other medicines, foods, dyes, or preservatives -pregnant or trying to get pregnant -breast-feeding How should I use this medicine? This  medicine is for injection into a vein. It is given by a health care professional in a hospital or clinic setting. Talk to your pediatrician regarding the use of this medicine in children. Special care may be needed. Overdosage: If you think you have taken too much of this medicine contact a poison control center or emergency room at once. NOTE: This medicine is only for you. Do not share this medicine with others. What if I miss a dose? It is important not to miss your dose. Call your doctor or health care professional if you are unable to keep an appointment. What may interact with this medicine? This medicine may interact with the following medications: -other iron products This list may not describe all possible interactions. Give your health care provider a list of all the medicines, herbs, non-prescription drugs, or dietary supplements you use. Also tell them if you smoke, drink alcohol, or use illegal drugs. Some items may interact with your medicine. What should I watch for while using this medicine? Visit your doctor or healthcare professional regularly. Tell your doctor or healthcare professional if your symptoms do not start to get better or if they get worse. You may need blood work done while you are taking this medicine. You may need to follow a special diet. Talk to your doctor. Foods that contain iron include: whole grains/cereals, dried fruits, beans, or peas, leafy green vegetables, and organ meats (liver, kidney). What side effects may I notice from receiving this medicine? Side effects that you should report to your doctor or health care professional as soon as possible: -allergic reactions like skin rash, itching or hives,  swelling of the face, lips, or tongue -breathing problems -changes in blood pressure -feeling faint or lightheaded, falls -fever or chills -flushing, sweating, or hot feelings -swelling of the ankles or feet Side effects that usually do not require medical  attention (Report these to your doctor or health care professional if they continue or are bothersome.): -diarrhea -headache -nausea, vomiting -stomach pain This list may not describe all possible side effects. Call your doctor for medical advice about side effects. You may report side effects to FDA at 1-800-FDA-1088. Where should I keep my medicine? This drug is given in a hospital or clinic and will not be stored at home. NOTE: This sheet is a summary. It may not cover all possible information. If you have questions about this medicine, talk to your doctor, pharmacist, or health care provider.    2016, Elsevier/Gold Standard. (2011-09-02 15:23:36)

## 2015-08-12 NOTE — Progress Notes (Signed)
OK to infuse Feraheme before or after chemo per Quita Skye in pharmacy.

## 2015-08-12 NOTE — Progress Notes (Signed)
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 Local edema without significant mass effect.   05/11/2015 Imaging CT chest, abdomen and pelvis with contrast showed right upper lobe nodule 1.7 cm, with right hilar, bilateral mediastinal, and the thoracic inlet adenopathy.   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 The biopsy or over the right upper lobe lung nodule was negative.   05/22/2015 - 05/27/2015 Radiation Therapy SRS to brain metastases    06/05/2015 Miscellaneous PD-L1 IHC <1%    06/05/2015 Pathology Results Right upper lobe lung nodule core needle biopsy showed poorly differentiated non-small cell lung cancer with associated necrosis, tumor cells are positive for cytokeratin 5/6, suggesting squamous cell carcinoma.   06/05/2015 Procedure IR RUL lung nodule biopsy    06/19/2015 -  Chemotherapy Carboplatin AUC 5 on D1,  and abraxane 184m/m2 on Day 1, 8 and 15, every 28 days    History of present illness (05/12/2015)  Patient is a 65 year old female with past medical history of hypertension, 30 year smoking history, remote history of breast cancer in 1992, presented with sudden  onset episodes of difficulty speaking, drooling, and facial twitching on 09/09/2015. It lasted about 7-8 minutes. EMS was called and she was brought to the hospital. Brain scan showed multiple brain lesions, highly suspicious for metastatic disease. CT chest abdomen and pelvis revealed a 2.3 cm right upper lobe lung nodule, along was hilar and mediastinal adenopathy. Pulmonary was consulted for biopsy. I saw her in the hospital, along with 2 of her granddaughters. She feels well now, denies any significant pain, dyspnea, or other symptoms. She does have mild chronic cough nonproductive. She states that her appetite has been low and she lost about 10-15 pounds in the past year. 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 She has 2 sisters and graduations in the area.  CURRENT THERAPY: Carboplatin AUC 5 on day 1, Abraxane 100 mg/m on day 1, 8 and 15, every 28 days, started on 06/19/2015, changed to carbo AUC 5 and abraxane 737mm2 on day 1 and 8, every 21 days since cycle 3 due to cytopenia   INTERVAL HISTORY:  Mrs. SmVetranoeturns for follow-up and cycle 3 chemo. She has been tolerating chemotherapy well, denies any significant nausea, diarrhea. She does notice slightly more fatigued lately, also able to tolerate routine activity in her work as usual. She also has developed mild numbness and tingling of her fingers and toes, no problems with her and her functions. Her gait is stable, no fall. She has been eating well, weight is stable.  REVIEW OF SYSTEMS:   Constitutional: Denies fevers, chills or abnormal weight loss Eyes: Denies blurriness of vision Ears, nose, mouth,  throat, and face: Denies mucositis or sore throat Respiratory: Denies cough, dyspnea or wheezes Cardiovascular: Denies palpitation, chest discomfort or lower extremity swelling Gastrointestinal:  Denies nausea, heartburn or change in bowel habits Skin: Denies abnormal skin rashes Lymphatics: Denies new  lymphadenopathy or easy bruising Neurological:Denies numbness, tingling or new weaknesses Behavioral/Psych: Mood is stable, no new changes  All other systems were reviewed with the patient and are negative.  MEDICAL HISTORY:  Past Medical History  Diagnosis Date  . Hypertension   . Hyperlipidemia   . Family history of adverse reaction to anesthesia     "my son passed away I think related to the anesthesia"  . Asthmatic bronchitis   . Chronic bronchitis ([place])   . Sinus headache   . Seizures ([place]) 05/10/2015    "it may have been related to me taking Cipro" (05/11/2015)  . Brain cancer ([place])   . Breast cancer [facility])     SURGICAL HISTORY: Past Surgical History  Procedure Laterality Date  . Appendectomy    . Abdominal hysterectomy    . Laparoscopic cholecystectomy    . Breast biopsy Right   . Breast lumpectomy Right   . Video bronchoscopy with endobronchial navigation N/A 05/14/2015    Procedure: VIDEO BRONCHOSCOPY WITH ENDOBRONCHIAL NAVIGATION;  Surgeon: Collene Gobble, MD;  Location: [facility];  Service: Thoracic;  Laterality: N/A;  . Video bronchoscopy with endobronchial ultrasound N/A 05/14/2015    Procedure: VIDEO BRONCHOSCOPY WITH ENDOBRONCHIAL ULTRASOUND;  Surgeon: Collene Gobble, MD;  Location: [facility];  Service: Thoracic;  Laterality: N/A;    ALLERGIES:  has No Known Allergies.  MEDICATIONS:  Current Outpatient Prescriptions  Medication Sig Dispense Refill  . aspirin EC 81 MG tablet Take 81 mg by mouth daily.    Marland Kitchen atenolol (TENORMIN) 50 MG tablet Take 50 mg by mouth daily. Reported on 07/13/2015    . cholecalciferol (VITAMIN D) 1000 units tablet Take 1 tablet (1,000 Units total) by mouth daily. 180 tablet 6  . lactobacillus acidophilus (BACID) TABS tablet Take 1 tablet by mouth daily.     Marland Kitchen levETIRAcetam (KEPPRA) 100 MG/ML solution Take 5 mLs (500 mg total) by mouth 2 (two) times daily. 473 mL 11  . LORazepam (ATIVAN) 0.5 MG tablet Take 1 tablet (0.5 mg total) by mouth every 6  (six) hours as needed (Nausea or vomiting). 30 tablet 0  . losartan-hydrochlorothiazide (HYZAAR) 50-12.5 MG tablet Take 1 tablet by mouth daily. Reported on 07/17/2015    . pravastatin (PRAVACHOL) 20 MG tablet Take 20 mg by mouth daily. Reported on 07/17/2015    . prochlorperazine (COMPAZINE) 10 MG tablet Take 1 tablet (10 mg total) by mouth every 6 (six) hours as needed (Nausea or vomiting). 30 tablet 1  . RESTASIS 0.05 % ophthalmic emulsion Place 1 drop into both eyes 2 (two) times daily.  3   No current facility-administered medications for this visit.    PHYSICAL EXAMINATION: ECOG PERFORMANCE STATUS: 1  Filed Vitals:   08/12/15 1207  BP: 151/72  Pulse: 106  Temp: 98 F (36.7 C)  Resp: 18   Filed Weights   08/12/15 1207  Weight: 123 lb (55.792 kg)    GENERAL:alert, no distress and comfortable SKIN: skin color, texture, turgor are normal, no rashes or significant lesions EYES: normal, Conjunctiva are pink and non-injected, sclera clear OROPHARYNX:no exudate, no erythema and lips, buccal mucosa, and tongue normal  NECK: supple, thyroid normal size, non-tender, without nodularity LYMPH:  no palpable lymphadenopathy in the cervical, axillary  or inguinal LUNGS: clear to auscultation and percussion with normal breathing effort HEART: regular rate & rhythm and no murmurs and no lower extremity edema ABDOMEN:abdomen soft, non-tender and normal bowel sounds Musculoskeletal:no cyanosis of digits and no clubbing  NEURO: alert & oriented x 3 with fluent speech, no focal motor/sensory deficits  LABORATORY DATA:  I have reviewed the data as listed CBC Latest Ref Rng 08/12/2015 07/31/2015 07/24/2015  WBC 3.9 - 10.3 10e3/uL 5.2 4.3 6.8  Hemoglobin 11.6 - 15.9 g/dL 8.1(L) 7.6(L) 7.5(L)  Hematocrit 34.8 - 46.6 % 25.5(L) 24.7(L) 23.3(L)  Platelets 145 - 400 10e3/uL 270 342 489(H)     CMP Latest Ref Rng 08/12/2015 07/31/2015 07/24/2015  Glucose 70 - 140 mg/dl 89 82 96  BUN 7.0 - 26.0 mg/dL  5.7(L) 6.8(L) 10.5  Creatinine 0.6 - 1.1 mg/dL 0.7 0.8 0.8  Sodium 136 - 145 mEq/L 138 138 137  Potassium 3.5 - 5.1 mEq/L 3.5 3.8 3.7  CO2 22 - 29 mEq/L 25 22 23   Calcium 8.4 - 10.4 mg/dL 10.5(H) 10.6(H) 10.2  Total Protein 6.4 - 8.3 g/dL 8.2 8.0 7.9  Total Bilirubin 0.20 - 1.20 mg/dL <0.30 <0.30 <0.30  Alkaline Phos 40 - 150 U/L 120 124 98  AST 5 - 34 U/L 10 11 11   ALT 0 - 55 U/L <9 <9 9   PATHOLOGY REPORT Diagnosis 05/14/2015  Lung, biopsy, Right upper lobe - MILDLY INFLAMED LUNG PARENCHYMA. - THERE IS NO EVIDENCE OF MALIGNANCY.  Diagnosis 05/14/2015  FINE NEEDLE ASPIRATION: EBUS, 4R, C (SPECIMEN 3 OF , COLLECTED ON 05/14/15): MALIGNANT CELLS PRESENT, CONSISTENT WITH NON SMALL CELL CARCINOMA. SEE COMMENT. COMMENT: THERE IS INSUFFICIENT MATERIAL PRESENT FOR ADDITIONAL STUDIES.  Diagnosis 06/05/2015 Lung, needle/core biopsy(ies), right upper lobe - POORLY DIFFERENTIATED NON SMALL CELL CARCINOMA WITH ASSOCIATED NECROSIS. - SEE COMMENT. Microscopic Comment In lieu of immunohistochemical stains, tissue will be preserved for additional studies, if requested. Dr. Burr Medico was paged on 06/08/15. (JBK:gt, 06/08/15)  ADDITIONAL INFORMATION: The tumor cells are positive for cyto5/5/2017keratin 5/6, suggesting squamous cell carcinoma. Per clinician request, a block will be sent for PDL-1 testing and results reported separately. (JBK:gt, 06/09/15)   RADIOGRAPHIC STUDIES: I have personally reviewed the radiological images as listed and agreed with the findings in the report.  CT Chest, abdomen and pelvis with contrast 05/11/2015 IMPRESSION: 1. Right upper lobe nodule with right hilar, bilateral mediastinal, and thoracic inlet adenopathy. 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 2. History of breast cancer with right axillary dissection. 3. Left nephrectomy for uncertain indication.  MRI brain w wo contrast 05/11/2015 IMPRESSION: At least 6 subcentimeter  supratentorial predominately cortical based enhancing nodules compatible with nonhemorrhagic metastatic disease. Local edema without significant mass effect.  Moderate chronic small vessel ischemic disease without acute ischemia.  PET 06/10/2015 IMPRESSION: 1. Hypermetabolic 2.7 cm peripheral right upper lobe pulmonary nodule, consistent with primary bronchogenic carcinoma. 2. Ipsilateral peribronchial, ipsilateral mediastinal and right level 4 neck nodal metastases. 3. Asymmetric foci of hypermetabolism in the left inferior frontal lobe and right cerebellar hemisphere, cannot exclude intra-axial brain metastases in these locations in this patient with known brain metastases. 4. Hypermetabolic thyroid isthmus nodule, for which a significant minority will represent thyroid malignancy. Correlate with thyroid ultrasound and ultrasound-guided fine-needle aspiration as clinically warranted. 5. No hypermetabolic metastatic disease in the abdomen, pelvis or skeleton. 6. Coronary atherosclerosis.  CT chest, abdomen and pelvis w contrast 08/10/2015 IMPRESSION: 1. Interval mixed response to therapy. 2. The primary lesion within  the right upper lobe has decreased in size from previous exam. There has been interval increase in size of right supraclavicular and right paratracheal adenopathy. 3. Aortic atherosclerosis and coronary artery calcification.   ASSESSMENT & PLAN:  A 65 year old female with past medical history of hypertension, 15 pack year history of smoking, presented with seizure like activity, images studies reviewed multiple (at least 6) brain lesions and a 2.3 cm right upper lobe lung nodule, with hilar and mediastinal adenopathy  1. 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  - her repeated Lung biopsy showed poor differentiated  squamous cell carcinoma,  PET scan showed hypermetabolic hilar, mediastinum, and  Right neck level IV nodal metastasis. - her brain metastasis has been treated with SRS , she is clinically doing very well, her dexamethasone will be tapered off gradually by Dr. Lisbeth Renshaw. - I reviewed the natural history and incurable nature of her metastatic squamous cell lung cancer,  Her overall prognosis is very poor giving the metastatic disease. - The goal of therapy is palliative and prolong her life. -Her tumor PD-L1<1%, unfortunately first line immunotherapy is not indicated. -She is currently on first-line chemotherapy with carbo and Abraxane, tolerating well. -I reviewed her restaging CT scans, which showed mixed response. Her primary right lung lesion has improved, mediastinal adenopathy is stable, right supraclavicular node slightly increased. Overall I seek stable disease. She is tolerating chemotherapy well, we'll continue. -She has developed worsening anemia, although not very somatic, she also has moderate fatigue especially on week 3 of chemotherapy, I'll decrease her Abraxane dose to 75 mg/m and change to day 1, 8 every 21 days from this cycle   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 She probably has a component of iron deficient anemia also -Giving her moderate anemia, and she does not want a blood transfusion, I recommended IV Feraheme.  She tolerated the first infusion well, her anemia has study improved. She is scheduled for second dose today.  3. HTN, COPD -she will continue follow up with her PCP   4. Peripheral neuropathy, G1 -Secondary to chemotherapy, mild -We'll continue monitoring  Plan -Lab reviewed, adequate for treatment, due to the moderate anemia and neuropathy, I'll decrease carbo to AUC 5.0 on  day1, and Abraxane 75 mg/m on day 1 and 8, every 21 days -IV Feraheme 510 mg today (second dose) -I'll see her back in 3 weeks before cycle 4 chemo   All questions were answered. The patient knows to call the clinic with any problems, questions or concerns.    Truitt Merle, MD 08/12/2015

## 2015-08-13 ENCOUNTER — Ambulatory Visit: Payer: BLUE CROSS/BLUE SHIELD

## 2015-08-14 ENCOUNTER — Telehealth: Payer: Self-pay | Admitting: Hematology

## 2015-08-14 NOTE — Telephone Encounter (Signed)
Spoke with pt to confirm 7/19 appt date/time per pof

## 2015-08-19 ENCOUNTER — Ambulatory Visit (HOSPITAL_BASED_OUTPATIENT_CLINIC_OR_DEPARTMENT_OTHER): Payer: BLUE CROSS/BLUE SHIELD

## 2015-08-19 ENCOUNTER — Other Ambulatory Visit (HOSPITAL_BASED_OUTPATIENT_CLINIC_OR_DEPARTMENT_OTHER): Payer: BLUE CROSS/BLUE SHIELD

## 2015-08-19 VITALS — BP 137/61 | HR 102 | Temp 98.0°F | Resp 18

## 2015-08-19 DIAGNOSIS — C7931 Secondary malignant neoplasm of brain: Secondary | ICD-10-CM

## 2015-08-19 DIAGNOSIS — C3491 Malignant neoplasm of unspecified part of right bronchus or lung: Secondary | ICD-10-CM

## 2015-08-19 DIAGNOSIS — C3411 Malignant neoplasm of upper lobe, right bronchus or lung: Secondary | ICD-10-CM | POA: Diagnosis not present

## 2015-08-19 DIAGNOSIS — D630 Anemia in neoplastic disease: Secondary | ICD-10-CM

## 2015-08-19 DIAGNOSIS — Z5111 Encounter for antineoplastic chemotherapy: Secondary | ICD-10-CM | POA: Diagnosis not present

## 2015-08-19 LAB — COMPREHENSIVE METABOLIC PANEL
ALT: <9 U/L (ref 0–55)
AST: 10 U/L (ref 5–34)
Albumin: 3.3 g/dL — ABNORMAL LOW (ref 3.5–5.0)
Alkaline Phosphatase: 118 U/L (ref 40–150)
Anion Gap: 15 mEq/L — ABNORMAL HIGH (ref 3–11)
BUN: 9.6 mg/dL (ref 7.0–26.0)
CHLORIDE: 102 meq/L (ref 98–109)
CO2: 23 meq/L (ref 22–29)
Calcium: 10.9 mg/dL — ABNORMAL HIGH (ref 8.4–10.4)
Creatinine: 0.9 mg/dL (ref 0.6–1.1)
EGFR: 80 mL/min/{1.73_m2} — AB (ref >90)
GLUCOSE: 94 mg/dL (ref 70–140)
POTASSIUM: 3.6 meq/L (ref 3.5–5.1)
SODIUM: 140 meq/L (ref 136–145)
Total Bilirubin: 0.38 mg/dL (ref 0.20–1.20)
Total Protein: 8.6 g/dL — ABNORMAL HIGH (ref 6.4–8.3)

## 2015-08-19 LAB — CBC WITH DIFFERENTIAL/PLATELET
BASO%: 0.6 % (ref 0.0–2.0)
BASOS ABS: 0 10*3/uL (ref 0.0–0.1)
EOS ABS: 0 10*3/uL (ref 0.0–0.5)
EOS%: 0.7 % (ref 0.0–7.0)
HCT: 27.6 % — ABNORMAL LOW (ref 34.8–46.6)
HGB: 8.7 g/dL — ABNORMAL LOW (ref 11.6–15.9)
LYMPH%: 29.2 % (ref 14.0–49.7)
MCH: 26.1 pg (ref 25.1–34.0)
MCHC: 31.5 g/dL (ref 31.5–36.0)
MCV: 82.8 fL (ref 79.5–101.0)
MONO#: 0.6 10*3/uL (ref 0.1–0.9)
MONO%: 13.8 % (ref 0.0–14.0)
NEUT#: 2.3 10*3/uL (ref 1.5–6.5)
NEUT%: 55.7 % (ref 38.4–76.8)
Platelets: 460 10*3/uL — ABNORMAL HIGH (ref 145–400)
RBC: 3.33 10*6/uL — AB (ref 3.70–5.45)
RDW: 20.8 % — ABNORMAL HIGH (ref 11.2–14.5)
WBC: 4.2 10*3/uL (ref 3.9–10.3)
lymph#: 1.2 10*3/uL (ref 0.9–3.3)

## 2015-08-19 MED ORDER — PROCHLORPERAZINE MALEATE 10 MG PO TABS
10.0000 mg | ORAL_TABLET | Freq: Once | ORAL | Status: AC
  Administered 2015-08-19: 10 mg via ORAL

## 2015-08-19 MED ORDER — PACLITAXEL PROTEIN-BOUND CHEMO INJECTION 100 MG
75.0000 mg/m2 | Freq: Once | INTRAVENOUS | Status: AC
  Administered 2015-08-19: 125 mg via INTRAVENOUS
  Filled 2015-08-19: qty 25

## 2015-08-19 MED ORDER — SODIUM CHLORIDE 0.9 % IV SOLN
Freq: Once | INTRAVENOUS | Status: AC
  Administered 2015-08-19: 09:00:00 via INTRAVENOUS

## 2015-08-19 MED ORDER — PROCHLORPERAZINE MALEATE 10 MG PO TABS
ORAL_TABLET | ORAL | Status: AC
  Filled 2015-08-19: qty 1

## 2015-08-19 NOTE — Patient Instructions (Signed)
[facility] Discharge Instructions for Patients Receiving Chemotherapy  Today you received the following chemotherapy agents Abraxane To help prevent nausea and vomiting after your treatment, we encourage you to take your nausea medication as prescribed.   If you develop nausea and vomiting that is not controlled by your nausea medication, call the clinic.   BELOW ARE SYMPTOMS THAT SHOULD BE REPORTED IMMEDIATELY:  *FEVER GREATER THAN 100.5 F  *CHILLS WITH OR WITHOUT FEVER  NAUSEA AND VOMITING THAT IS NOT CONTROLLED WITH YOUR NAUSEA MEDICATION  *UNUSUAL SHORTNESS OF BREATH  *UNUSUAL BRUISING OR BLEEDING  TENDERNESS IN MOUTH AND THROAT WITH OR WITHOUT PRESENCE OF ULCERS  *URINARY PROBLEMS  *BOWEL PROBLEMS  UNUSUAL RASH Items with * indicate a potential emergency and should be followed up as soon as possible.  Feel free to call the clinic you have any questions or concerns. The clinic phone number is [phone].  Please show the CHEMO ALERT CARD at check-in to the Emergency Department and triage nurse.   

## 2015-08-25 ENCOUNTER — Ambulatory Visit
Admission: RE | Admit: 2015-08-25 | Discharge: 2015-08-25 | Disposition: A | Discharge status: Home / Self Care | Payer: BLUE CROSS/BLUE SHIELD | Source: Ambulatory Visit | Attending: Radiation Oncology | Admitting: Radiation Oncology

## 2015-08-25 DIAGNOSIS — C7949 Secondary malignant neoplasm of other parts of nervous system: Principal | ICD-10-CM

## 2015-08-25 DIAGNOSIS — C7931 Secondary malignant neoplasm of brain: Secondary | ICD-10-CM

## 2015-08-25 MED ORDER — GADOBENATE DIMEGLUMINE 529 MG/ML IV SOLN
11.0000 mL | Freq: Once | INTRAVENOUS | Status: AC | PRN
  Administered 2015-08-25: 11 mL via INTRAVENOUS

## 2015-08-26 ENCOUNTER — Ambulatory Visit: Payer: Self-pay | Admitting: Radiation Oncology

## 2015-08-26 ENCOUNTER — Ambulatory Visit
Admission: RE | Admit: 2015-08-26 | Discharge: 2015-08-26 | Disposition: A | Discharge status: Home / Self Care | Payer: BLUE CROSS/BLUE SHIELD | Source: Ambulatory Visit | Attending: Radiation Oncology | Admitting: Radiation Oncology

## 2015-08-26 HISTORY — PX: CATARACT EXTRACTION: SUR2

## 2015-08-28 ENCOUNTER — Other Ambulatory Visit: Payer: Self-pay | Admitting: Hematology

## 2015-09-02 ENCOUNTER — Other Ambulatory Visit: Payer: BLUE CROSS/BLUE SHIELD

## 2015-09-02 ENCOUNTER — Ambulatory Visit: Payer: BLUE CROSS/BLUE SHIELD

## 2015-09-03 NOTE — Progress Notes (Signed)
Does patient have an allergy to IV contrast dye?: No.   Has patient ever received premedication for IV contrast dye?: No.   Does patient take metformin?: No.  If patient does take metformin when was the last dose: not diabetic  Date of lab work: 08/19/2015 BUN: 9.6 CR: 0.9  IV site:  locations: Patient to have an IV site from Infusion

## 2015-09-04 ENCOUNTER — Ambulatory Visit
Admission: RE | Admit: 2015-09-04 | Discharge: 2015-09-04 | Disposition: A | Discharge status: Home / Self Care | Payer: BLUE CROSS/BLUE SHIELD | Source: Ambulatory Visit | Attending: Radiation Oncology | Admitting: Radiation Oncology

## 2015-09-04 ENCOUNTER — Ambulatory Visit (HOSPITAL_BASED_OUTPATIENT_CLINIC_OR_DEPARTMENT_OTHER): Payer: BLUE CROSS/BLUE SHIELD | Admitting: Nurse Practitioner

## 2015-09-04 ENCOUNTER — Ambulatory Visit: Payer: BLUE CROSS/BLUE SHIELD

## 2015-09-04 ENCOUNTER — Other Ambulatory Visit (HOSPITAL_BASED_OUTPATIENT_CLINIC_OR_DEPARTMENT_OTHER): Payer: BLUE CROSS/BLUE SHIELD

## 2015-09-04 VITALS — BP 129/66 | HR 102 | Temp 97.8°F | Resp 17 | Ht 61.0 in | Wt 121.4 lb

## 2015-09-04 DIAGNOSIS — D509 Iron deficiency anemia, unspecified: Secondary | ICD-10-CM

## 2015-09-04 DIAGNOSIS — C7931 Secondary malignant neoplasm of brain: Secondary | ICD-10-CM | POA: Insufficient documentation

## 2015-09-04 DIAGNOSIS — C801 Malignant (primary) neoplasm, unspecified: Secondary | ICD-10-CM | POA: Diagnosis not present

## 2015-09-04 DIAGNOSIS — C3491 Malignant neoplasm of unspecified part of right bronchus or lung: Secondary | ICD-10-CM

## 2015-09-04 DIAGNOSIS — D630 Anemia in neoplastic disease: Secondary | ICD-10-CM | POA: Diagnosis not present

## 2015-09-04 DIAGNOSIS — C3411 Malignant neoplasm of upper lobe, right bronchus or lung: Secondary | ICD-10-CM | POA: Diagnosis not present

## 2015-09-04 DIAGNOSIS — Z510 Encounter for antineoplastic radiation therapy: Secondary | ICD-10-CM | POA: Diagnosis present

## 2015-09-04 DIAGNOSIS — J449 Chronic obstructive pulmonary disease, unspecified: Secondary | ICD-10-CM

## 2015-09-04 DIAGNOSIS — I10 Essential (primary) hypertension: Secondary | ICD-10-CM

## 2015-09-04 LAB — CBC WITH DIFFERENTIAL/PLATELET
BASO%: 0.9 % (ref 0.0–2.0)
BASOS ABS: 0 10*3/uL (ref 0.0–0.1)
EOS%: 0.2 % (ref 0.0–7.0)
Eosinophils Absolute: 0 10*3/uL (ref 0.0–0.5)
HCT: 26 % — ABNORMAL LOW (ref 34.8–46.6)
HEMOGLOBIN: 8.2 g/dL — AB (ref 11.6–15.9)
LYMPH%: 26.2 % (ref 14.0–49.7)
MCH: 26.4 pg (ref 25.1–34.0)
MCHC: 31.7 g/dL (ref 31.5–36.0)
MCV: 83.1 fL (ref 79.5–101.0)
MONO#: 0.7 10*3/uL (ref 0.1–0.9)
MONO%: 14.3 % — AB (ref 0.0–14.0)
NEUT%: 58.4 % (ref 38.4–76.8)
NEUTROS ABS: 2.7 10*3/uL (ref 1.5–6.5)
Platelets: 201 10*3/uL (ref 145–400)
RBC: 3.13 10*6/uL — ABNORMAL LOW (ref 3.70–5.45)
RDW: 21.2 % — AB (ref 11.2–14.5)
WBC: 4.6 10*3/uL (ref 3.9–10.3)
lymph#: 1.2 10*3/uL (ref 0.9–3.3)

## 2015-09-04 LAB — COMPREHENSIVE METABOLIC PANEL
ALBUMIN: 3 g/dL — AB (ref 3.5–5.0)
ALK PHOS: 98 U/L (ref 40–150)
ALT: <9 U/L (ref 0–55)
AST: 9 U/L (ref 5–34)
Anion Gap: 14 mEq/L — ABNORMAL HIGH (ref 3–11)
BUN: 6.6 mg/dL — AB (ref 7.0–26.0)
CHLORIDE: 103 meq/L (ref 98–109)
CO2: 22 mEq/L (ref 22–29)
CREATININE: 0.7 mg/dL (ref 0.6–1.1)
Calcium: 10.7 mg/dL — ABNORMAL HIGH (ref 8.4–10.4)
EGFR: >90 mL/min/{1.73_m2} (ref >90)
GLUCOSE: 107 mg/dL (ref 70–140)
POTASSIUM: 3.4 meq/L — AB (ref 3.5–5.1)
SODIUM: 138 meq/L (ref 136–145)
Total Bilirubin: 0.3 mg/dL (ref 0.20–1.20)
Total Protein: 7.8 g/dL (ref 6.4–8.3)

## 2015-09-04 NOTE — Progress Notes (Signed)
Does patient have an allergy to IV contrast dye?: NO   Has patient ever received premedication for IV contrast dye?: No  Does patient take metformin?: NO  If patient does take metformin when was the last dose: not diabetic  Date of lab work: 09/04/15: BUN : 6.6 CR: 0.7  IV site: Left forearm, started by MyrtleHardin, RN, 2 attempts, after  I tried 1 attempt and Patric Dykes RN attempted x 1, we had good blood return on each attempt, but wouldn't flush, patient was very tolerant,  3:18 PM

## 2015-09-04 NOTE — Progress Notes (Signed)
error 

## 2015-09-04 NOTE — Progress Notes (Signed)
[place] OFFICE PROGRESS NOTE   Diagnosis:  Metastatic lung cancer SUMMARY OF ONCOLOGIC HISTORY:      Oncology History   Primary cancer of right lung metastatic to other site [facility])  Staging form: Lung, AJCC 7th Edition  Clinical stage from 05/14/2015: Stage IV (T1a, N3, M1b) - Signed by Truitt Merle, MD on 06/07/2015       Primary cancer of right lung metastatic to other site [facility])   05/11/2015 Imaging Brain MRI with and without contrast showed at least 6 subcentimeter supra tentorial cortical-based enhanced nodules compatible with nonhemorrhagic metastatic disease. Local edema without significant mass effect.   05/11/2015 Imaging CT chest, abdomen and pelvis with contrast showed right upper lobe nodule 1.7 cm, with right hilar, bilateral mediastinal, and the thoracic inlet adenopathy.   05/14/2015 Initial Diagnosis Primary cancer of right lung metastatic to other site [facility])   05/14/2015 Initial Biopsy Bronchoscopy and EBUS biopsy of 4R node showed non-small cell carcinoma, insufficient material for additional studies. The biopsy or over the right upper lobe lung nodule was negative.   05/22/2015 - 05/27/2015 Radiation Therapy SRS to brain metastases    06/05/2015 Miscellaneous PD-L1 IHC <1%    06/05/2015 Pathology Results Right upper lobe lung nodule core needle biopsy showed poorly differentiated non-small cell lung cancer with associated necrosis, tumor cells are positive for cytokeratin 5/6, suggesting squamous cell carcinoma.   06/05/2015 Procedure IR RUL lung nodule biopsy    06/19/2015 -  Chemotherapy Carboplatin AUC 5 on D1, and abraxane 155m/m2 on Day 1, 8 and 15, every 28 days     INTERVAL HISTORY:   Ms. SDemoreturns as scheduled. She completed cycle 3 day 1 carboplatin/Abraxane 08/12/2015. She completed the day 8 Abraxane on 08/19/2015.  Follow-up MRI of the brain on 08/25/2015 showed new lesions in the right cerebellum, medial  left frontal lobe, right frontal lobe and 2 foci in the right parietal lobe. Other small lesions scattered throughout the brain smaller or stable. SRS is planned.  She denies nausea/vomiting. No mouth sores. No diarrhea. No numbness or tingling in her hands or feet. She denies shortness of breath. No cough. No fever. Appetite is unchanged.  Objective:  Vital signs in last 24 hours:  Blood pressure 129/66, pulse (!) 102, temperature 97.8 F (36.6 C), temperature source Oral, resp. rate 17, height 5' 1"  (1.549 m), weight 121 lb 6.4 oz (55.1 kg), SpO2 100 %.    HEENT: No thrush or ulcers. Resp: Lungs clear bilaterally. Cardio: Regular rate and rhythm. GI: Abdomen soft and nontender. No hepatomegaly. Vascular: No leg edema. Calves soft and nontender. Neuro: Alert and oriented. Gait normal.   Lab Results:  Lab Results  Component Value Date   WBC 4.6 09/04/2015   HGB 8.2 (L) 09/04/2015   HCT 26.0 (L) 09/04/2015   MCV 83.1 09/04/2015   PLT 201 09/04/2015   NEUTROABS 2.7 09/04/2015    Imaging:  No results found.  Medications: I have reviewed the patient's current medications.  Assessment/Plan: 1. 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 Primary lesion within the right upper lobe decreased in size and interval increase in size of right supra clavicular and right peritracheal adenopathy; cycle 3 initiated 08/12/2015. Follow-up MRI brain 08/25/2015 with  new lesions noted. SRS planned. 2. Anemia in neoplastic disease, iron deficiency. She continues oral iron. 3. Hypertension, COPD. Follow up with PCP.   Disposition: Ms. Hartzell appears stable. She has completed 3 cycles of  carboplatin/Abraxane. Recent follow-up MRI brain showed new lesions. SRS is planned 09/09/2015. Per Dr. Burr Medico chemotherapy will be held until one week after [facility].  Ms. Gemmill will return for a follow-up visit as scheduled on 09/09/2015. She will contact the office in the interim with any problems.    Ned Card ANP/GNP-BC   09/04/2015  9:52 AM

## 2015-09-07 DIAGNOSIS — Z510 Encounter for antineoplastic radiation therapy: Secondary | ICD-10-CM | POA: Diagnosis not present

## 2015-09-08 ENCOUNTER — Telehealth: Payer: Self-pay | Admitting: *Deleted

## 2015-09-08 ENCOUNTER — Other Ambulatory Visit: Payer: Self-pay | Admitting: *Deleted

## 2015-09-08 NOTE — Telephone Encounter (Signed)
Spoke with pt and instructed pt that appts with Dr. Burr Medico will be rescheduled to Friday  09/18/15.  Reinforced that pt needs to keep appt with Dr. Lisbeth Renshaw on 09/09/15 as scheduled.  POF sent to scheduler today.  Pt voiced understanding.

## 2015-09-09 ENCOUNTER — Ambulatory Visit: Payer: BLUE CROSS/BLUE SHIELD | Admitting: Hematology

## 2015-09-09 ENCOUNTER — Ambulatory Visit
Admission: RE | Admit: 2015-09-09 | Discharge: 2015-09-09 | Disposition: A | Discharge status: Home / Self Care | Payer: BLUE CROSS/BLUE SHIELD | Source: Ambulatory Visit | Attending: Radiation Oncology | Admitting: Radiation Oncology

## 2015-09-09 ENCOUNTER — Ambulatory Visit: Payer: BLUE CROSS/BLUE SHIELD

## 2015-09-09 ENCOUNTER — Other Ambulatory Visit: Payer: BLUE CROSS/BLUE SHIELD

## 2015-09-09 VITALS — BP 129/59 | HR 100 | Temp 97.8°F

## 2015-09-09 DIAGNOSIS — Z510 Encounter for antineoplastic radiation therapy: Secondary | ICD-10-CM | POA: Diagnosis not present

## 2015-09-09 DIAGNOSIS — C7931 Secondary malignant neoplasm of brain: Secondary | ICD-10-CM

## 2015-09-09 NOTE — Progress Notes (Addendum)
  Radiation Oncology         [phone]) [phone] ________________________________  Name: Rose [name] MRN: [id number]  Date: 09/09/2015  DOB: 04/17/1950  End of Treatment Note  Diagnosis:      ICD-9-CM ICD-10-CM   1. Metastatic squamous cell carcinoma to brain ([place]) 198.3 C79.31         Indication for treatment:  palliative       Radiation treatment dates:   09/09/2015  Site/dose:    1.  PTVs 7-11 were treated using 11 Arcs to a prescription dose of 20 Gy using a single isocenter technique.. ExacTrac Snap verification was performed for each couch angle.   Narrative: The patient tolerated radiation treatment well.   There were no signs of acute toxicity after treatment.  Plan: The patient has completed radiation treatment. The patient will return to radiation oncology clinic for routine followup in one month. I advised the patient to call or return sooner if they have any questions or concerns related to their recovery or treatment. ________________________________  ------------------------------------------------  Jodelle Gross, MD, PhD

## 2015-09-09 NOTE — Progress Notes (Signed)
Patient voices no c/o pain, nausea, dizzy ness or vision changes,no pain, gave instructions to rest when she gets home, to eat and to call for any unusual symptoms if occurs, vomiring, fever, not normal for her, asked is she wanted a w/c, patient declined, ambulated down hall steady gait, voiced verbal understanding, not on Decadron 1:49 PM  1:48 PM'

## 2015-09-09 NOTE — Progress Notes (Addendum)
  Radiation Oncology         [phone] ________________________________  Name: Rose [name] MRN: [id number]  Date: 09/09/2015  DOB: [birth year]   SPECIAL TREATMENT PROCEDURE   3D TREATMENT PLANNING AND DOSIMETRY: The patient's radiation plan was reviewed and approved by Dr. Sherwood Gambler from neurosurgery and radiation oncology prior to treatment. It showed 3-dimensional radiation distributions overlaid onto the planning CT/MRI image set. The [facility] for the target structures as well as the organs at risk were reviewed. The documentation of the 3D plan and dosimetry are filed in the radiation oncology EMR.   NARRATIVE: The patient was brought to the TrueBeam stereotactic radiation treatment machine and placed supine on the CT couch. The head frame was applied, and the patient was set up for stereotactic radiosurgery. Neurosurgery was present for the set-up and delivery   SIMULATION VERIFICATION: In the couch zero-angle position, the patient underwent Exactrac imaging using the Brainlab system with orthogonal KV images. These were carefully aligned and repeated to confirm treatment position for each of the isocenters. The Exactrac snap film verification was repeated at each couch angle.   SPECIAL TREATMENT PROCEDURE: The patient received stereotactic radiosurgery to the following targets:  1.  PTVs 7-11 were treated using 11 Arcs to a prescription dose of 20 Gy using a single isocenter technique.. ExacTrac Snap verification was performed for each couch angle.    STEREOTACTIC TREATMENT MANAGEMENT: Following delivery, the patient was transported to nursing in stable condition and monitored for possible acute effects. Vital signs were recorded . The patient tolerated treatment without significant acute effects, and was discharged to home in stable condition.  PLAN: Follow-up in one month.   ------------------------------------------------  Jodelle Gross, MD, PhD

## 2015-09-09 NOTE — Progress Notes (Signed)
  Radiation Oncology         [phone] ________________________________  Name: Rose [name] MRN: [id number]  Date: 09/04/2015  DOB: Jul 24, 1950  DIAGNOSIS:     ICD-9-CM ICD-10-CM   1. Metastatic squamous cell carcinoma to brain ([facility]) 198.3 C79.31     NARRATIVE:  The patient was brought to the [facility].  Identity was confirmed.  All relevant records and images related to the planned course of therapy were reviewed.  The patient freely provided informed written consent to proceed with treatment after reviewing the details related to the planned course of therapy. The consent form was witnessed and verified by the simulation staff. Intravenous access was established for contrast administration. Then, the patient was set-up in a stable reproducible supine position for radiation therapy.  A relocatable thermoplastic stereotactic head frame was fabricated for precise immobilization.  CT images were obtained.  Surface markings were placed.  The CT images were loaded into the planning software and fused with the patient's targeting MRI scan.  Then the target and avoidance structures were contoured.  Treatment planning then occurred.  The radiation prescription was entered and confirmed.  I have requested 3D planning  I have requested a DVH of the following structures: Brain stem, brain, left eye, right eye, lenses, optic chiasm, target volumes, uninvolved brain, and normal tissue.    SPECIAL TREATMENT PROCEDURE:  The planned course of therapy using radiation constitutes a special treatment procedure. Special care is required in the management of this patient for the following reasons. This treatment constitutes a Special Treatment Procedure for the following reason: High dose per fraction requiring special monitoring for increased toxicities of treatment including daily imaging.  The special nature of the planned course of radiotherapy will require increased physician supervision and  oversight to ensure patient's safety with optimal treatment outcomes.  PLAN:  The patient will receive 20 Gy in 1 fraction to 5 new/progressive lesions using a single isocenter technique.   ------------------------------------------------  Jodelle Gross, MD, PhD

## 2015-09-09 NOTE — Progress Notes (Signed)
Rose [name] here for post [facility] monitoring for 30 minutes.  Vitals stable.  She denies having pain, headache, nausea, dizziness or vision changes.  Call light in reach and family present in the room.

## 2015-09-09 NOTE — Addendum Note (Signed)
Encounter addended by: Kyung Rudd, MD on: 09/09/2015  2:45 PM<BR>    Actions taken: Sign clinical note

## 2015-09-09 NOTE — Op Note (Signed)
Stereotactic Radiosurgery Operative Note  Name: Rose [name] MRN: [id number]  Date: 09/09/2015  DOB: [birth year]  Op Note  Pre Operative Diagnosis:  Lung cancer with multiple (5 subcentimeter) brain metastases  Post Operative Diagnois:  Lung cancer with multiple (5 subcentimeter) brain metastases  3D TREATMENT PLANNING AND DOSIMETRY:  The patient's radiation plan was reviewed and approved by myself (neurosurgery) and Dr. Kyung Rudd (radiation oncology) prior to treatment.  It showed 3-dimensional radiation distributions overlaid onto the planning CT/MRI image set.  The [facility] for the target structures as well as the organs at risk were reviewed. The documentation of the 3D plan and dosimetry are filed in the radiation oncology EMR.  NARRATIVE:  Rose [name] was brought to the TrueBeam stereotactic radiation treatment machine and placed supine on the CT couch. The head frame was applied, and the patient was set up for stereotactic radiosurgery.  I was present for the set-up and delivery.  SIMULATION VERIFICATION:  In the couch zero-angle position, the patient underwent Exactrac imaging using the Brainlab system with orthogonal KV images.  These were carefully aligned and repeated to confirm treatment position for each of the isocenters.  The Exactrac snap film verification was repeated at each couch angle.  SPECIAL TREATMENT PROCEDURE: Rose [name] received stereotactic radiosurgery to the following targets: 5 brain metastases (all subcentimeter in diameter) targets were treated using 17 segmented Dynamic Conformal Arcs using a single isocenter, multitarget treatment plan to a prescription dose of 20 Gy for each metastasis. ExacTrac registration was performed for each couch angle. The 100% isodose line was prescribed.   STEREOTACTIC TREATMENT MANAGEMENT:  Following delivery, the patient was transported to nursing in stable condition and monitored for possible acute effects.  Vital signs were  recorded There were no vitals taken for this visit.. The patient tolerated treatment without significant acute effects, and was discharged to home in stable condition.    PLAN: Follow-up in one month.

## 2015-09-11 ENCOUNTER — Telehealth: Payer: Self-pay | Admitting: Hematology

## 2015-09-11 NOTE — Telephone Encounter (Signed)
left msg confirming 8/18 apt times

## 2015-09-14 ENCOUNTER — Telehealth: Payer: Self-pay | Admitting: Radiation Therapy

## 2015-09-14 NOTE — Telephone Encounter (Signed)
Rose [name] returned my [place] follow-up call Friday afternoon, 8/11 and left me a voicemail.   She is feeling fine in regards to the brain [place] treatment that was delivered on 8/9. She is however having an issue with only getting a couple of hours of sleep each night and feeling very tired. She has requested that someone give her something to help her go to sleep. She also mentioned that her food tastes different than before. I do not see steroids on her med list at this time.   I have forwarded these complaints to her Radiation Oncologist, Dr. Lisbeth Renshaw, his PA, Shona Simpson and their nurse, Gaspar Garbe requesting advisement.   Mont Dutton R.T.(R)(T) Special Procedures Navigator  Radiation Oncology

## 2015-09-15 ENCOUNTER — Telehealth: Payer: Self-pay | Admitting: *Deleted

## 2015-09-15 NOTE — Telephone Encounter (Signed)
Returned call to the patient, per Shona Simpson, patient to try either Melantonin over the counter or tylenol pm to help with sleep , if you want rx to call your primary MD, patient thnked [place] calling back 1:56 PM

## 2015-09-16 ENCOUNTER — Ambulatory Visit (INDEPENDENT_AMBULATORY_CARE_PROVIDER_SITE_OTHER): Payer: BLUE CROSS/BLUE SHIELD | Admitting: Nurse Practitioner

## 2015-09-16 ENCOUNTER — Encounter: Payer: Self-pay | Admitting: Nurse Practitioner

## 2015-09-16 VITALS — BP 120/65 | HR 114 | Ht 61.0 in | Wt 117.4 lb

## 2015-09-16 DIAGNOSIS — R569 Unspecified convulsions: Secondary | ICD-10-CM

## 2015-09-16 DIAGNOSIS — C7931 Secondary malignant neoplasm of brain: Secondary | ICD-10-CM | POA: Diagnosis not present

## 2015-09-16 NOTE — Progress Notes (Signed)
I have reviewed and agreed above plan. 

## 2015-09-16 NOTE — Progress Notes (Signed)
[facility]  PATIENT: Rose [name] DOB: 07/06/1950   REASON FOR VISIT: Follow-up for partial seizure disorder HISTORY FROM: Patient    HISTORY OF PRESENT ILLNESS:UPDATE 08/16/2017CM. Rose [name], 65 year old female returns for follow-up. She was initially evaluated by Rose [name] in May . She has history of seizure event presenting with right arm facial twitching no loss of consciousness evaluation demonstrated metastatic brain cancer. She is currently on Keppra 500 mg twice daily without further seizure events. She has just completed her radiation therapy.. She has no new neurologic complaints she returns for reevaluation  HISTORY 06/17/15 Rose [name] Is a 65 years old right-handed female, follow-up for hospital discharge for new onset of partial seizure in Jun 17 2015.  She presented with sudden onset right arm facial twitching without loss of consciousness in May 11 2015, she was able to call 911, was brought to hospital  Evaluation has demonstrated metastatic brain cancer later with biopsy-proven right lung upper lobe squamous cell carcinoma, poorly differentiated, stage IV, CAT scan showed hypermetabolic hilar, mediastinum, andright neck level IV nodal metastasis. She was evaluated by oncologist, her overall prognosis is very poor. She was given palliative brain radiation therapy, also was treated with tapering dose of Decadron.  She drove to office today, she still works part-time as Retail banker, lives alone, per patient, there was no recurrent seizure. She was given prescription of Keppra 500 mg twice a day, but she has not refilled her prescription   I have personally reviewed MRI of the brain in April 2017 with without contrast: 6 subcentimeter supratentorial predominately cortical based enhancing nodules nonhemorrhagic metastatic disease involving right frontal, left temporal cortical region, Local edema without significant mass effect.  I  reviewed laboratory evaluation, CBC showed anemia with hemoglobin of 9.9, normal CMP with creatinine 0.7  REVIEW OF SYSTEMS: Full 14 system review of systems performed and notable only for those listed, all others are neg:  Constitutional: neg  Cardiovascular: neg Ear/Nose/Throat: neg  Skin: neg Eyes: neg Respiratory: neg Gastroitestinal: neg  Hematology/Lymphatic: neg  Endocrine: neg Musculoskeletal:neg Allergy/Immunology: neg Neurological: Partial seizure disorder Psychiatric: neg Sleep : neg   ALLERGIES: No Known Allergies  HOME MEDICATIONS: Outpatient Medications Prior to Visit  Medication Sig Dispense Refill  . aspirin EC 81 MG tablet Take 81 mg by mouth daily.    Marland Kitchen atenolol (TENORMIN) 50 MG tablet Take 50 mg by mouth daily. Reported on 08/12/2015    . cholecalciferol (VITAMIN D) 1000 units tablet Take 1 tablet (1,000 Units total) by mouth daily. 180 tablet 6  . ketorolac (ACULAR) 0.4 % SOLN   1  . lactobacillus acidophilus (BACID) TABS tablet Take 1 tablet by mouth daily.     Marland Kitchen levETIRAcetam (KEPPRA) 100 MG/ML solution Take 5 mLs (500 mg total) by mouth 2 (two) times daily. 473 mL 11  . LORazepam (ATIVAN) 0.5 MG tablet Take 1 tablet (0.5 mg total) by mouth every 6 (six) hours as needed (Nausea or vomiting). 30 tablet 0  . losartan-hydrochlorothiazide (HYZAAR) 50-12.5 MG tablet Take 1 tablet by mouth daily. Reported on 08/12/2015    . pravastatin (PRAVACHOL) 20 MG tablet Take 20 mg by mouth daily. Reported on 08/12/2015    . prednisoLONE acetate (PRED FORTE) 1 % ophthalmic suspension INSTILL 1 DROP INTO SURGICAL EYE QID AFTER SURGERY  1  . prochlorperazine (COMPAZINE) 10 MG tablet Take 1 tablet (10 mg total) by mouth every 6 (six) hours as needed (Nausea or vomiting). 30 tablet 1  No facility-administered medications prior to visit.     PAST MEDICAL HISTORY: Past Medical History:  Diagnosis Date  . Asthmatic bronchitis   . Brain cancer ([place])   . Breast cancer ([place])     . Chronic bronchitis ([place])   . Family history of adverse reaction to anesthesia    "my son passed away I think related to the anesthesia"  . Hyperlipidemia   . Hypertension   . Seizures ([place]) 05/10/2015   "it may have been related to me taking Cipro" (05/11/2015)  . Sinus headache     PAST SURGICAL HISTORY: Past Surgical History:  Procedure Laterality Date  . ABDOMINAL HYSTERECTOMY    . APPENDECTOMY    . BREAST BIOPSY Right   . BREAST LUMPECTOMY Right   . CATARACT EXTRACTION Right 08/26/2015  . LAPAROSCOPIC CHOLECYSTECTOMY    . VIDEO BRONCHOSCOPY WITH ENDOBRONCHIAL NAVIGATION N/A 05/14/2015   Procedure: VIDEO BRONCHOSCOPY WITH ENDOBRONCHIAL NAVIGATION;  Surgeon: Collene Gobble, MD;  Location: [facility];  Service: Thoracic;  Laterality: N/A;  . VIDEO BRONCHOSCOPY WITH ENDOBRONCHIAL ULTRASOUND N/A 05/14/2015   Procedure: VIDEO BRONCHOSCOPY WITH ENDOBRONCHIAL ULTRASOUND;  Surgeon: Collene Gobble, MD;  Location: [facility];  Service: Thoracic;  Laterality: N/A;    FAMILY HISTORY: Family History  Problem Relation Age of Onset  . Heart failure Father   . Breast cancer Mother 23  . Gastric cancer Daughter 19  . Diabetes Sister   . Hypertension Sister     SOCIAL HISTORY: Social History   Social History  . Marital status: Divorced    Spouse name: N/A  . Number of children: 2  . Years of education: Associates   Occupational History  . [employer]   Social History Main Topics  . Smoking status: Former Smoker    Packs/day: 0.50    Years: 36.00    Types: Cigarettes  . Smokeless tobacco: Never Used     Comment: quit 2 weeks ago 05/13/15  . Alcohol use No  . Drug use: No  . Sexual activity: Yes    Birth control/ protection: Surgical   Other Topics Concern  . Not on file   Social History Narrative   Lives at home alone.   Right-handed.   1 cup coffee per day.   Reports having two children that both passed away in their 29s from CHF.     PHYSICAL  EXAM  Vitals:   09/16/15 0847  BP: 120/65  Pulse: (!) 114  Weight: 117 lb 6.4 oz (53.3 kg)  Height: '5\' 1"'$  (1.549 m)   Body mass index is 22.18 kg/m. Gen: NAD, conversant, well nourised,  well groomed                     Cardiovascular: Regular rate rhythm, no peripheral edema, warm, nontender. Neck: Supple, no carotid bruit.  NEUROLOGICAL EXAM:  MENTAL STATUS: Speech:    Speech is normal; fluent and spontaneous with normal comprehension.  Cognition:     Orientation to time, place and person     Normal recent and remote memory     Normal Attention span and concentration     Normal Language, naming, repeating,spontaneous speech     Fund of knowledge   CRANIAL NERVES: CN II: Visual fields are full to confrontation. Fundoscopic exam is normal with sharp discs and no vascular changes. Pupils are round equal and briskly reactive to light. CN III, IV, VI: extraocular movement are normal.  No ptosis. CN V: Facial sensation is intact to pinprick in all 3 divisions bilaterally.  CN VII: Face is symmetric with normal eye closure and smile. CN VIII: Hearing is normal to rubbing fingers CN IX, X: Palate elevates symmetrically. Phonation is normal. CN XI: Head turning and shoulder shrug are intact CN XII: Tongue is midline with normal movements and no atrophy.  MOTOR:There is no pronator drift of out-stretched arms. Muscle bulk and tone are normal. Muscle strength is normal. REFLEXES:Reflexes are 2+ and symmetric at the biceps, triceps, knees, and ankles. Plantar responses are flexor. SENSORY:Intact to light touch, pinprick, positional sensation and vibratory sensation are intact in fingers and toes. COORDINATION:Rapid alternating movements and fine finger movements are intact. There is no dysmetria on finger-to-nose and heel-knee-shin.   GAIT/STANCE:Posture is normal. Gait is steady with normal steps, base, arm swing, and turning. Heel and toe walking are normal. Tandem gait is  normal.  Romberg is absent.    DIAGNOSTIC DATA (LABS, IMAGING, TESTING) - I reviewed patient records, labs, notes, testing and imaging myself where available.  Lab Results  Component Value Date   WBC 4.6 09/04/2015   HGB 8.2 (L) 09/04/2015   HCT 26.0 (L) 09/04/2015   MCV 83.1 09/04/2015   PLT 201 09/04/2015      Component Value Date/Time   NA 138 09/04/2015 0859   K 3.4 (L) 09/04/2015 0859   CL 103 05/14/2015 0359   CO2 22 09/04/2015 0859   GLUCOSE 107 09/04/2015 0859   BUN 6.6 (L) 09/04/2015 0859   CREATININE 0.7 09/04/2015 0859   CALCIUM 10.7 (H) 09/04/2015 0859   PROT 7.8 09/04/2015 0859   ALBUMIN 3.0 (L) 09/04/2015 0859   AST 9 09/04/2015 0859   ALT <9 09/04/2015 0859   ALKPHOS 98 09/04/2015 0859   BILITOT 0.30 09/04/2015 0859   GFRNONAA >60 05/14/2015 0359   GFRAA >60 05/14/2015 0359    Lab Results  Component Value Date   VITAMINB12 263 07/31/2015    ASSESSMENT AND PLAN  65 y.o. year old female  has a past medical history of  Brain cancer ([place]); Breast cancer ([place]); Chronic bronchitis ([place]); Seizures ([place]) (05/10/2015);  here to follow-up for her seizure disorder. She is currently on Keppra without further seizure events.   PLAN: Continue Keppra at current dose Call for any seizure activity She was given patient information and reviewed patient information on epilepsy Follow-up in 6 months Dennie Bible, [facility], [facility], APRN  [facility] [address] [place], [place] [address] [phone]

## 2015-09-16 NOTE — Patient Instructions (Signed)
Continue Keppra at current dose Call for any seizure activity Follow-up in 6 months

## 2015-09-18 ENCOUNTER — Other Ambulatory Visit: Payer: Self-pay | Admitting: *Deleted

## 2015-09-18 ENCOUNTER — Ambulatory Visit (HOSPITAL_BASED_OUTPATIENT_CLINIC_OR_DEPARTMENT_OTHER): Payer: BLUE CROSS/BLUE SHIELD

## 2015-09-18 ENCOUNTER — Ambulatory Visit (HOSPITAL_COMMUNITY)
Admission: RE | Admit: 2015-09-18 | Discharge: 2015-09-18 | Disposition: A | Discharge status: Home / Self Care | Payer: BLUE CROSS/BLUE SHIELD | Source: Ambulatory Visit | Attending: Hematology | Admitting: Hematology

## 2015-09-18 ENCOUNTER — Other Ambulatory Visit (HOSPITAL_BASED_OUTPATIENT_CLINIC_OR_DEPARTMENT_OTHER): Payer: BLUE CROSS/BLUE SHIELD

## 2015-09-18 ENCOUNTER — Telehealth: Payer: Self-pay | Admitting: Hematology

## 2015-09-18 ENCOUNTER — Ambulatory Visit: Payer: BLUE CROSS/BLUE SHIELD

## 2015-09-18 ENCOUNTER — Encounter: Payer: Self-pay | Admitting: Hematology

## 2015-09-18 ENCOUNTER — Ambulatory Visit (HOSPITAL_BASED_OUTPATIENT_CLINIC_OR_DEPARTMENT_OTHER): Payer: BLUE CROSS/BLUE SHIELD | Admitting: Hematology

## 2015-09-18 VITALS — BP 121/57 | HR 109 | Temp 98.7°F | Resp 17 | Ht 61.0 in | Wt 119.2 lb

## 2015-09-18 VITALS — HR 106

## 2015-09-18 DIAGNOSIS — D509 Iron deficiency anemia, unspecified: Secondary | ICD-10-CM

## 2015-09-18 DIAGNOSIS — D630 Anemia in neoplastic disease: Secondary | ICD-10-CM | POA: Insufficient documentation

## 2015-09-18 DIAGNOSIS — R630 Anorexia: Secondary | ICD-10-CM

## 2015-09-18 DIAGNOSIS — C7931 Secondary malignant neoplasm of brain: Secondary | ICD-10-CM | POA: Diagnosis not present

## 2015-09-18 DIAGNOSIS — G4700 Insomnia, unspecified: Secondary | ICD-10-CM

## 2015-09-18 DIAGNOSIS — Z5111 Encounter for antineoplastic chemotherapy: Secondary | ICD-10-CM | POA: Diagnosis not present

## 2015-09-18 DIAGNOSIS — F329 Major depressive disorder, single episode, unspecified: Secondary | ICD-10-CM

## 2015-09-18 DIAGNOSIS — C3411 Malignant neoplasm of upper lobe, right bronchus or lung: Secondary | ICD-10-CM

## 2015-09-18 DIAGNOSIS — G622 Polyneuropathy due to other toxic agents: Secondary | ICD-10-CM

## 2015-09-18 DIAGNOSIS — I10 Essential (primary) hypertension: Secondary | ICD-10-CM

## 2015-09-18 DIAGNOSIS — J449 Chronic obstructive pulmonary disease, unspecified: Secondary | ICD-10-CM

## 2015-09-18 DIAGNOSIS — C3491 Malignant neoplasm of unspecified part of right bronchus or lung: Secondary | ICD-10-CM

## 2015-09-18 LAB — COMPREHENSIVE METABOLIC PANEL
ALT: <9 U/L (ref 0–55)
ANION GAP: 11 meq/L (ref 3–11)
AST: 10 U/L (ref 5–34)
Albumin: 2.8 g/dL — ABNORMAL LOW (ref 3.5–5.0)
Alkaline Phosphatase: 100 U/L (ref 40–150)
BUN: 8.7 mg/dL (ref 7.0–26.0)
CHLORIDE: 101 meq/L (ref 98–109)
CO2: 24 meq/L (ref 22–29)
Calcium: 11 mg/dL — ABNORMAL HIGH (ref 8.4–10.4)
Creatinine: 0.7 mg/dL (ref 0.6–1.1)
Glucose: 111 mg/dl (ref 70–140)
Potassium: 3.6 mEq/L (ref 3.5–5.1)
Sodium: 137 mEq/L (ref 136–145)
Total Protein: 8.4 g/dL — ABNORMAL HIGH (ref 6.4–8.3)

## 2015-09-18 LAB — CBC WITH DIFFERENTIAL/PLATELET
BASO%: 0.5 % (ref 0.0–2.0)
BASOS ABS: 0 10*3/uL (ref 0.0–0.1)
EOS ABS: 0 10*3/uL (ref 0.0–0.5)
EOS%: 0.8 % (ref 0.0–7.0)
HCT: 26.1 % — ABNORMAL LOW (ref 34.8–46.6)
HGB: 8.2 g/dL — ABNORMAL LOW (ref 11.6–15.9)
LYMPH%: 31.3 % (ref 14.0–49.7)
MCH: 26.3 pg (ref 25.1–34.0)
MCHC: 31.4 g/dL — AB (ref 31.5–36.0)
MCV: 83.7 fL (ref 79.5–101.0)
MONO#: 0.7 10*3/uL (ref 0.1–0.9)
MONO%: 16.4 % — ABNORMAL HIGH (ref 0.0–14.0)
NEUT#: 2.3 10*3/uL (ref 1.5–6.5)
NEUT%: 51 % (ref 38.4–76.8)
PLATELETS: 569 10*3/uL — AB (ref 145–400)
RBC: 3.12 10*6/uL — AB (ref 3.70–5.45)
RDW: 20.2 % — ABNORMAL HIGH (ref 11.2–14.5)
WBC: 4.5 10*3/uL (ref 3.9–10.3)
lymph#: 1.4 10*3/uL (ref 0.9–3.3)

## 2015-09-18 LAB — ABO/RH: ABO/RH(D): A POS

## 2015-09-18 MED ORDER — PROCHLORPERAZINE MALEATE 10 MG PO TABS: 10.0000 mg | ORAL_TABLET | Freq: Four times a day (QID) | ORAL | 1 refills | Status: DC | PRN

## 2015-09-18 MED ORDER — PALONOSETRON HCL INJECTION 0.25 MG/5ML
INTRAVENOUS | Status: AC
  Filled 2015-09-18: qty 5

## 2015-09-18 MED ORDER — ONDANSETRON HCL 8 MG PO TABS: 8.0000 mg | ORAL_TABLET | Freq: Three times a day (TID) | ORAL | 1 refills | Status: DC | PRN

## 2015-09-18 MED ORDER — SODIUM CHLORIDE 0.9 % IV SOLN
Freq: Once | INTRAVENOUS | Status: AC
  Administered 2015-09-18: 16:00:00 via INTRAVENOUS

## 2015-09-18 MED ORDER — SODIUM CHLORIDE 0.9 % IV SOLN
10.0000 mg | Freq: Once | INTRAVENOUS | Status: AC
  Administered 2015-09-18: 10 mg via INTRAVENOUS
  Filled 2015-09-18: qty 1

## 2015-09-18 MED ORDER — SODIUM CHLORIDE 0.9 % IV SOLN
345.2000 mg | Freq: Once | INTRAVENOUS | Status: AC
  Administered 2015-09-18: 350 mg via INTRAVENOUS
  Filled 2015-09-18: qty 35

## 2015-09-18 MED ORDER — SODIUM CHLORIDE 0.9 % IJ SOLN
10.0000 mL | INTRAMUSCULAR | Status: DC | PRN
  Filled 2015-09-18: qty 10

## 2015-09-18 MED ORDER — PACLITAXEL PROTEIN-BOUND CHEMO INJECTION 100 MG
75.0000 mg/m2 | Freq: Once | INTRAVENOUS | Status: AC
  Administered 2015-09-18: 125 mg via INTRAVENOUS
  Filled 2015-09-18: qty 25

## 2015-09-18 MED ORDER — PALONOSETRON HCL INJECTION 0.25 MG/5ML
0.2500 mg | Freq: Once | INTRAVENOUS | Status: AC
  Administered 2015-09-18: 0.25 mg via INTRAVENOUS

## 2015-09-18 MED ORDER — MIRTAZAPINE 15 MG PO TABS: 15.0000 mg | ORAL_TABLET | Freq: Every day | ORAL | 2 refills | Status: DC

## 2015-09-18 NOTE — Patient Instructions (Signed)
[facility] Discharge Instructions for Patients Receiving Chemotherapy  Today you received the following chemotherapy agents Abraxane and Carboplatin  To help prevent nausea and vomiting after your treatment, we encourage you to take your nausea medication as directed. No Zofran for 3 days. Take Compazine instead.    If you develop nausea and vomiting that is not controlled by your nausea medication, call the clinic.   BELOW ARE SYMPTOMS THAT SHOULD BE REPORTED IMMEDIATELY:  *FEVER GREATER THAN 100.5 F  *CHILLS WITH OR WITHOUT FEVER  NAUSEA AND VOMITING THAT IS NOT CONTROLLED WITH YOUR NAUSEA MEDICATION  *UNUSUAL SHORTNESS OF BREATH  *UNUSUAL BRUISING OR BLEEDING  TENDERNESS IN MOUTH AND THROAT WITH OR WITHOUT PRESENCE OF ULCERS  *URINARY PROBLEMS  *BOWEL PROBLEMS  UNUSUAL RASH Items with * indicate a potential emergency and should be followed up as soon as possible.  Feel free to call the clinic you have any questions or concerns. The clinic phone number is [phone].  Please show the [facility] at check-in to the Emergency Department and triage nurse.

## 2015-09-18 NOTE — Progress Notes (Signed)
Ok to treat with HR of 106 per MD Burr Medico.

## 2015-09-18 NOTE — Telephone Encounter (Signed)
Pt will p/u sched in tx room

## 2015-09-18 NOTE — Progress Notes (Unsigned)
Pt does not have a port, labs will be drawn peripherally

## 2015-09-18 NOTE — Progress Notes (Signed)
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 Local edema without significant mass effect.      05/11/2015 Imaging    CT chest, abdomen and pelvis with contrast showed right upper lobe nodule 1.7 cm, with right hilar, bilateral mediastinal, and the thoracic inlet adenopathy.      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 The biopsy or over the right upper lobe lung nodule was negative.      05/22/2015 - 05/27/2015 Radiation Therapy    SRS to brain metastases       06/05/2015 Miscellaneous    PD-L1 IHC <1%       06/05/2015 Pathology Results    Right upper lobe lung nodule core needle biopsy showed poorly differentiated non-small cell lung cancer with associated necrosis, tumor cells are positive for cytokeratin 5/6, suggesting squamous cell carcinoma.      06/05/2015 Procedure    IR RUL lung nodule biopsy       06/19/2015 -  Chemotherapy    Carboplatin AUC 5 on D1,  and abraxane 15m/m2 on Day 1, 8 and 15, every 28 days       09/09/2015 -  Radiation Therapy    Pt received SBRT to her new brain mets       History of present illness  (05/12/2015)  Patient is a 65year old female with past medical history of hypertension, 30 year smoking history, remote history of breast cancer in 1992, presented with sudden onset episodes of difficulty speaking, drooling, and facial twitching on 09/09/2015. It lasted about 7-8 minutes. EMS was called and she was brought to the [name]. Brain scan showed multiple brain lesions, highly suspicious for metastatic disease. CT chest abdomen and pelvis revealed a 2.3 cm right upper lobe lung nodule, along was hilar and mediastinal adenopathy. Pulmonary was consulted for biopsy. I saw her in the [name], along with 2 of her granddaughters. She feels well now, denies any significant pain, dyspnea, or other symptoms. She does have mild chronic cough nonproductive. She states that her appetite has been low and she lost about 10-15 pounds in the past year. 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 She has 2 sisters and graduations in the area.  CURRENT THERAPY: Carboplatin AUC 5 on day 1, Abraxane 100 mg/m on day 1, 8 and 15, every 28 days, started on 06/19/2015, changed to carbo AUC 5 and abraxane 751mm2 on day 1 and 8, every 21 days since cycle 3 due to cytopenia   INTERVAL HISTORY:  [name] for follow[facility]up. She has been feeling down, depressed, poor sleep with early wake up in  the morning, low appetite. She broke into tears when I asked her about her depression. She was found to have new pulmonary metastases a few weeks ago, and underwent brain SBRT on 8/9 for which she tolerated very well. She is very scared and anxious that her cancer has progressed. She has had low appetite and energy level, her speech and reaction to questions has been slower than before, she acknowledges some memory loss. No suicidal ideas. She is accompanied by her friend Sam to clinic today.   REVIEW OF SYSTEMS:   Constitutional: Denies fevers, chills or abnormal weight loss Eyes: Denies  blurriness of vision Ears, nose, mouth, throat, and face: Denies mucositis or sore throat Respiratory: Denies cough, dyspnea or wheezes Cardiovascular: Denies palpitation, chest discomfort or lower extremity swelling Gastrointestinal:  Denies nausea, heartburn or change in bowel habits Skin: Denies abnormal skin rashes Lymphatics: Denies new lymphadenopathy or easy bruising Neurological:Denies numbness, tingling or new weaknesses Behavioral/Psych: Mood is stable, no new changes  All other systems were reviewed with the patient and are negative.  MEDICAL HISTORY:  Past Medical History:  Diagnosis Date  . Asthmatic bronchitis   . Brain cancer ([name])   . Breast cancer ([name])   . Chronic bronchitis ([name])   . Family history of adverse reaction to anesthesia    "my son passed away I think related to the anesthesia"  . Hyperlipidemia   . Hypertension   . Seizures ([place]) 05/10/2015   "it may have been related to me taking Cipro" (05/11/2015)  . Sinus headache     SURGICAL HISTORY: Past Surgical History:  Procedure Laterality Date  . ABDOMINAL HYSTERECTOMY    . APPENDECTOMY    . BREAST BIOPSY Right   . BREAST LUMPECTOMY Right   . CATARACT EXTRACTION Right 08/26/2015  . LAPAROSCOPIC CHOLECYSTECTOMY    . VIDEO BRONCHOSCOPY WITH ENDOBRONCHIAL NAVIGATION N/A 05/14/2015   Procedure: VIDEO BRONCHOSCOPY WITH ENDOBRONCHIAL NAVIGATION;  Surgeon: Rose Gobble, MD;  Location: [name];  Service: Thoracic;  Laterality: N/A;  . VIDEO BRONCHOSCOPY WITH ENDOBRONCHIAL ULTRASOUND N/A 05/14/2015   Procedure: VIDEO BRONCHOSCOPY WITH ENDOBRONCHIAL ULTRASOUND;  Surgeon: Rose Gobble, MD;  Location: [name];  Service: Thoracic;  Laterality: N/A;    ALLERGIES:  has No Known Allergies.  MEDICATIONS:  Current Outpatient Prescriptions  Medication Sig Dispense Refill  . aspirin EC 81 MG tablet Take 81 mg by mouth daily.    . cholecalciferol (VITAMIN D) 1000 units tablet Take 1 tablet (1,000 Units total) by mouth  daily. 180 tablet 6  . lactobacillus acidophilus (BACID) TABS tablet Take 1 tablet by mouth daily.     Marland Kitchen levETIRAcetam (KEPPRA) 100 MG/ML solution Take 5 mLs (500 mg total) by mouth 2 (two) times daily. 473 mL 11  . LORazepam (ATIVAN) 0.5 MG tablet Take 1 tablet (0.5 mg total) by mouth every 6 (six) hours as needed (Nausea or vomiting). 30 tablet 0  . prednisoLONE acetate (PRED FORTE) 1 % ophthalmic suspension INSTILL 1 DROP INTO SURGICAL EYE QID AFTER SURGERY  1  . prochlorperazine (COMPAZINE) 10 MG tablet Take 1 tablet (10 mg total) by mouth every 6 (six) hours as needed (Nausea or vomiting). 30 tablet 1  . atenolol (TENORMIN) 50 MG tablet Take 50 mg by mouth daily. Reported on 08/12/2015    . mirtazapine (REMERON) 15 MG tablet Take 1 tablet (15 mg total) by mouth at bedtime. 30 tablet 2  . ondansetron (ZOFRAN) 8 MG tablet Take 1 tablet (8 mg total) by  mouth every 8 (eight) hours as needed for nausea or vomiting. 30 tablet 1  . pravastatin (PRAVACHOL) 20 MG tablet Take 20 mg by mouth daily. Reported on 08/12/2015     No current facility-administered medications for this visit.    Facility-Administered Medications Ordered in Other Visits  Medication Dose Route Frequency Provider Last Rate Last Dose  . sodium chloride 0.9 % injection 10 mL  10 mL Intracatheter PRN Rose Merle, MD        PHYSICAL EXAMINATION: ECOG PERFORMANCE STATUS: 1  Vitals:   09/18/15 1320  BP: (!) 121/57  Pulse: (!) 109  Resp: 17  Temp: 98.7 F (37.1 C)   Filed Weights   09/18/15 1320  Weight: 119 lb 3.2 oz (54.1 kg)    GENERAL:alert, no distress and comfortable SKIN: skin color, texture, turgor are normal, no rashes or significant lesions EYES: normal, Conjunctiva are pink and non-injected, sclera clear OROPHARYNX:no exudate, no erythema and lips, buccal mucosa, and tongue normal  NECK: supple, thyroid normal size, non-tender, without nodularity LYMPH:  no palpable lymphadenopathy in the cervical, axillary or  inguinal LUNGS: clear to auscultation and percussion with normal breathing effort HEART: regular rate & rhythm and no murmurs and no lower extremity edema ABDOMEN:abdomen soft, non-tender and normal bowel sounds Musculoskeletal:no cyanosis of digits and no clubbing  NEURO: alert & oriented x 3 with fluent speech, no focal motor/sensory deficits  LABORATORY DATA:  I have reviewed the data as listed CBC Latest Ref Rng & Units 09/18/2015 09/04/2015 08/19/2015  WBC 3.9 - 10.3 10e3/uL 4.5 4.6 4.2  Hemoglobin 11.6 - 15.9 g/dL 8.2(L) 8.2(L) 8.7(L)  Hematocrit 34.8 - 46.6 % 26.1(L) 26.0(L) 27.6(L)  Platelets 145 - 400 10e3/uL 569(H) 201 460(H)     CMP Latest Ref Rng & Units 09/18/2015 09/04/2015 08/19/2015  Glucose 70 - 140 mg/dl 111 107 94  BUN 7.0 - 26.0 mg/dL 8.7 6.6(L) 9.6  Creatinine 0.6 - 1.1 mg/dL 0.7 0.7 0.9  Sodium 136 - 145 mEq/L 137 138 140  Potassium 3.5 - 5.1 mEq/L 3.6 3.4(L) 3.6  Chloride 101 - 111 mmol/L - - -  CO2 22 - 29 mEq/L _0 Calcium 8.4 - 10.4 mg/dL 11.0(H) 10.7(H) 10.9(H)  Total Protein 6.4 - 8.3 g/dL 8.4(H) 7.8 8.6(H)  Total Bilirubin 0.20 - 1.20 mg/dL <0.30 0.30 0.38  Alkaline Phos 40 - 150 U/L 100 98 118  AST 5 - 34 U/L _1 ALT 0 - 55 U/L <9 <9 <9   PATHOLOGY REPORT Diagnosis 05/14/2015  Lung, biopsy, Right upper lobe - MILDLY INFLAMED LUNG PARENCHYMA. - THERE IS NO EVIDENCE OF MALIGNANCY.  Diagnosis 05/14/2015  FINE NEEDLE ASPIRATION: EBUS, 4R, C (SPECIMEN 3 OF , COLLECTED ON 05/14/15): MALIGNANT CELLS PRESENT, CONSISTENT WITH NON SMALL CELL CARCINOMA. SEE COMMENT. COMMENT: THERE IS INSUFFICIENT MATERIAL PRESENT FOR ADDITIONAL STUDIES.  Diagnosis 06/05/2015 Lung, needle/core biopsy(ies), right upper lobe - POORLY DIFFERENTIATED NON SMALL CELL CARCINOMA WITH ASSOCIATED NECROSIS. - SEE COMMENT. Microscopic Comment In lieu of immunohistochemical stains, tissue will be preserved for additional studies, if requested. Dr. Burr Medico was paged on 06/08/15.  (JBK:gt, 06/08/15)  ADDITIONAL INFORMATION: The tumor cells are positive for cyto5/5/2017keratin 5/6, suggesting squamous cell carcinoma. Per clinician request, a block will be sent for PDL-1 testing and results reported separately. (JBK:gt, 06/09/15)   RADIOGRAPHIC STUDIES: I have personally reviewed the radiological images as listed and agreed with the findings in the report.  PET 06/10/2015 IMPRESSION: 1. Hypermetabolic 2.7 cm peripheral  right upper lobe pulmonary nodule, consistent with primary bronchogenic carcinoma. 2. Ipsilateral peribronchial, ipsilateral mediastinal and right level 4 neck nodal metastases. 3. Asymmetric foci of hypermetabolism in the left inferior frontal lobe and right cerebellar hemisphere, cannot exclude intra-axial brain metastases in these locations in this patient with known brain metastases. 4. Hypermetabolic thyroid isthmus nodule, for which a significant minority will represent thyroid malignancy. Correlate with thyroid ultrasound and ultrasound-guided fine-needle aspiration as clinically warranted. 5. No hypermetabolic metastatic disease in the abdomen, pelvis or skeleton. 6. Coronary atherosclerosis.  CT chest, abdomen and pelvis w contrast 08/10/2015 IMPRESSION: 1. Interval mixed response to therapy. 2. The primary lesion within the right upper lobe has decreased in size from previous exam. There has been interval increase in size of right supraclavicular and right paratracheal adenopathy. 3. Aortic atherosclerosis and coronary artery calcification.  IMPRESSION: Newly seen 2-3 mm lesions in the right cerebellum, medial left frontal lobe, right frontal lobe and 2 foci in the right parietal lobe. Other small lesions scattered throughout the brain are smaller or Stable.   ASSESSMENT & PLAN:  A 65 year old female with past medical history of hypertension, 15 pack year history of smoking, presented with seizure like activity, images studies  reviewed multiple (at least 6) brain lesions and a 2.3 cm right upper lobe lung nodule, with hilar and mediastinal adenopathy  1. 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  - her repeated Lung biopsy showed poor differentiated squamous cell carcinoma,  PET scan showed hypermetabolic hilar, mediastinum, and  Right neck level IV nodal metastasis. -She unfortunately developed new brain metastasis, had second SBRT on August 9.. - I reviewed the natural history and incurable nature of her metastatic squamous cell lung cancer,  Her overall prognosis is very poor giving the metastatic disease. - The goal of therapy is palliative and prolong her life. -Her tumor PD-L1<1%, unfortunately first line immunotherapy is not indicated. -She is currently on first-line chemotherapy with [place] and Abraxane, which was held lately due to her SBRT  -I reviewed her restaging CT scans from 08/10/2015, which showed mixed response. Her primary right lung lesion has improved, mediastinal adenopathy is stable, right supraclavicular node slightly increased. Overall I seek stable disease.  -She has developed more symptoms lately, and her performance status has deteriorated.  I recommend her to restart chemotherapy, with further dose reduction of carboplatin to AUC 4.  -Plan to repeat CT scans after this cycle of chemotherapy.   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 She probably has a component of iron deficient anemia also -she received iv feraheme in early July, responded well -Given her worsening and symptomatic anemia, I recommend her to have 2u RBC in the next few days. Potential benefits  and side effects were discussed with her, she agrees.   3. HTN, COPD -she will continue follow up with her PCP   4. 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 Depression -She has developed depression symptoms lately, she was tearful during her office visit. She denies suicidal ideas -I recommend her to start mirtazapine 15 mg at bedtime, for depression, anorexia and insomnia. Potential benefit and side effects were discussed, she agrees to try.   Plan -  Lab reviewed, adequate for treatment, due to fatigue and worsening PS, I'll decrease carbo to AUC 4.0 on day1, and continue Abraxane 75 mg/m on day 1 and 8, 15 every 21 days -she will start mirtazapine 15 mg at bedtime daily  -I'll see her next week before chemotherapy   All questions were answered. The patient knows to call the clinic with any problems, questions or concerns.    Rose Merle, MD 09/18/15

## 2015-09-19 LAB — PREPARE RBC (CROSSMATCH)

## 2015-09-21 ENCOUNTER — Ambulatory Visit (HOSPITAL_COMMUNITY)
Admission: RE | Admit: 2015-09-21 | Discharge: 2015-09-21 | Disposition: A | Discharge status: Home / Self Care | Payer: BLUE CROSS/BLUE SHIELD | Source: Ambulatory Visit | Attending: Hematology | Admitting: Hematology

## 2015-09-21 DIAGNOSIS — D630 Anemia in neoplastic disease: Secondary | ICD-10-CM

## 2015-09-21 LAB — PREPARE RBC (CROSSMATCH)

## 2015-09-21 MED ORDER — HEPARIN SOD (PORK) LOCK FLUSH 100 UNIT/ML IV SOLN: 500.0000 [IU] | Freq: Every day | INTRAVENOUS | Status: DC | PRN

## 2015-09-21 MED ORDER — SODIUM CHLORIDE 0.9% FLUSH: 3.0000 mL | INTRAVENOUS | Status: DC | PRN

## 2015-09-21 MED ORDER — HEPARIN SOD (PORK) LOCK FLUSH 100 UNIT/ML IV SOLN: 250.0000 [IU] | INTRAVENOUS | Status: DC | PRN

## 2015-09-21 MED ORDER — SODIUM CHLORIDE 0.9% FLUSH: 10.0000 mL | INTRAVENOUS | Status: DC | PRN

## 2015-09-21 MED ORDER — SODIUM CHLORIDE 0.9 % IV SOLN
250.0000 mL | Freq: Once | INTRAVENOUS | Status: AC
  Administered 2015-09-21: 250 mL via INTRAVENOUS

## 2015-09-21 NOTE — Progress Notes (Signed)
Ordering Provider: Dr. Burr Medico  Diagnosis: Symptomatic anemia  Procedure: Transfusion of 2 units PRBCs; no complications noted; pt alert, oriented, and ambulatory upon discharge; accompanied by family

## 2015-09-21 NOTE — Discharge Instructions (Signed)
Blood Transfusion, Care After  These instructions give you information about caring for yourself after your procedure. Your doctor may also give you more specific instructions. Call your doctor if you have any problems or questions after your procedure.   HOME CARE   Take medicines only as told by your doctor. Ask your doctor if you can take an over-the-counter pain reliever if you have a fever or headache a day or two after your procedure.   Return to your normal activities as told by your doctor.  GET HELP IF:    You develop redness or irritation at your IV site.   You have a fever, chills, or a headache that does not go away.   Your pee (urine) is darker than normal.   Your urine turns:    Pink.    Red.    Brown.   The white part of your eye turns yellow (jaundice).   You feel weak after doing your normal activities.  GET HELP RIGHT AWAY IF:    You have trouble breathing.   You have fever and chills and you also have:    Anxiety.    Chest or back pain.    Flushed or pink skin.    Clammy or sweaty skin.    A fast heartbeat.    A sick feeling in your stomach (nausea).     This information is not intended to replace advice given to you by your health care provider. Make sure you discuss any questions you have with your health care provider.     Document Released: 02/07/2014 Document Reviewed: 02/07/2014  Elsevier Interactive Patient Education 2016 [employer]

## 2015-09-22 LAB — TYPE AND SCREEN
ABO/RH(D): A POS
ANTIBODY SCREEN: NEGATIVE
UNIT DIVISION: 0
UNIT DIVISION: 0

## 2015-09-23 ENCOUNTER — Ambulatory Visit: Payer: BLUE CROSS/BLUE SHIELD

## 2015-09-23 ENCOUNTER — Other Ambulatory Visit: Payer: BLUE CROSS/BLUE SHIELD

## 2015-09-25 ENCOUNTER — Telehealth: Payer: Self-pay | Admitting: Hematology

## 2015-09-25 ENCOUNTER — Ambulatory Visit: Payer: BLUE CROSS/BLUE SHIELD

## 2015-09-25 ENCOUNTER — Other Ambulatory Visit (HOSPITAL_BASED_OUTPATIENT_CLINIC_OR_DEPARTMENT_OTHER): Payer: BLUE CROSS/BLUE SHIELD

## 2015-09-25 ENCOUNTER — Ambulatory Visit (HOSPITAL_BASED_OUTPATIENT_CLINIC_OR_DEPARTMENT_OTHER): Payer: BLUE CROSS/BLUE SHIELD | Admitting: Hematology

## 2015-09-25 ENCOUNTER — Ambulatory Visit (HOSPITAL_BASED_OUTPATIENT_CLINIC_OR_DEPARTMENT_OTHER): Payer: BLUE CROSS/BLUE SHIELD

## 2015-09-25 VITALS — BP 127/57 | HR 106 | Temp 98.2°F | Resp 16

## 2015-09-25 DIAGNOSIS — C3411 Malignant neoplasm of upper lobe, right bronchus or lung: Secondary | ICD-10-CM | POA: Diagnosis not present

## 2015-09-25 DIAGNOSIS — C7931 Secondary malignant neoplasm of brain: Secondary | ICD-10-CM | POA: Diagnosis not present

## 2015-09-25 DIAGNOSIS — D630 Anemia in neoplastic disease: Secondary | ICD-10-CM

## 2015-09-25 DIAGNOSIS — Z5111 Encounter for antineoplastic chemotherapy: Secondary | ICD-10-CM | POA: Diagnosis not present

## 2015-09-25 DIAGNOSIS — D509 Iron deficiency anemia, unspecified: Secondary | ICD-10-CM

## 2015-09-25 DIAGNOSIS — F329 Major depressive disorder, single episode, unspecified: Secondary | ICD-10-CM

## 2015-09-25 DIAGNOSIS — J449 Chronic obstructive pulmonary disease, unspecified: Secondary | ICD-10-CM

## 2015-09-25 DIAGNOSIS — C3491 Malignant neoplasm of unspecified part of right bronchus or lung: Secondary | ICD-10-CM

## 2015-09-25 DIAGNOSIS — G620 Drug-induced polyneuropathy: Secondary | ICD-10-CM

## 2015-09-25 DIAGNOSIS — R630 Anorexia: Secondary | ICD-10-CM

## 2015-09-25 DIAGNOSIS — F32A Depression, unspecified: Secondary | ICD-10-CM

## 2015-09-25 DIAGNOSIS — C778 Secondary and unspecified malignant neoplasm of lymph nodes of multiple regions: Secondary | ICD-10-CM

## 2015-09-25 DIAGNOSIS — G4700 Insomnia, unspecified: Secondary | ICD-10-CM

## 2015-09-25 DIAGNOSIS — I10 Essential (primary) hypertension: Secondary | ICD-10-CM

## 2015-09-25 DIAGNOSIS — D638 Anemia in other chronic diseases classified elsewhere: Secondary | ICD-10-CM

## 2015-09-25 LAB — CBC WITH DIFFERENTIAL/PLATELET
BASO%: 0.2 % (ref 0.0–2.0)
Basophils Absolute: 0 10*3/uL (ref 0.0–0.1)
EOS%: 0.4 % (ref 0.0–7.0)
Eosinophils Absolute: 0 10*3/uL (ref 0.0–0.5)
HCT: 32.1 % — ABNORMAL LOW (ref 34.8–46.6)
HEMOGLOBIN: 10.2 g/dL — AB (ref 11.6–15.9)
LYMPH%: 28.9 % (ref 14.0–49.7)
MCH: 27.8 pg (ref 25.1–34.0)
MCHC: 31.8 g/dL (ref 31.5–36.0)
MCV: 87.5 fL (ref 79.5–101.0)
MONO#: 1 10*3/uL — ABNORMAL HIGH (ref 0.1–0.9)
MONO%: 22.1 % — AB (ref 0.0–14.0)
NEUT#: 2.2 10*3/uL (ref 1.5–6.5)
NEUT%: 48.4 % (ref 38.4–76.8)
Platelets: 498 10*3/uL — ABNORMAL HIGH (ref 145–400)
RBC: 3.67 10*6/uL — ABNORMAL LOW (ref 3.70–5.45)
RDW: 17.2 % — AB (ref 11.2–14.5)
WBC: 4.6 10*3/uL (ref 3.9–10.3)
lymph#: 1.3 10*3/uL (ref 0.9–3.3)

## 2015-09-25 LAB — COMPREHENSIVE METABOLIC PANEL
ALBUMIN: 2.7 g/dL — AB (ref 3.5–5.0)
ALK PHOS: 111 U/L (ref 40–150)
ALT: 13 U/L (ref 0–55)
AST: 15 U/L (ref 5–34)
Anion Gap: 11 mEq/L (ref 3–11)
BUN: 12.8 mg/dL (ref 7.0–26.0)
CHLORIDE: 99 meq/L (ref 98–109)
CO2: 24 meq/L (ref 22–29)
Calcium: 10.8 mg/dL — ABNORMAL HIGH (ref 8.4–10.4)
Creatinine: 0.7 mg/dL (ref 0.6–1.1)
EGFR: >90 mL/min/{1.73_m2} (ref >90)
GLUCOSE: 101 mg/dL (ref 70–140)
POTASSIUM: 4.1 meq/L (ref 3.5–5.1)
SODIUM: 134 meq/L — AB (ref 136–145)
Total Bilirubin: 0.3 mg/dL (ref 0.20–1.20)
Total Protein: 8.2 g/dL (ref 6.4–8.3)

## 2015-09-25 MED ORDER — PROCHLORPERAZINE MALEATE 10 MG PO TABS
10.0000 mg | ORAL_TABLET | Freq: Once | ORAL | Status: AC
  Administered 2015-09-25: 10 mg via ORAL

## 2015-09-25 MED ORDER — SODIUM CHLORIDE 0.9 % IV SOLN
Freq: Once | INTRAVENOUS | Status: AC
  Administered 2015-09-25: 13:00:00 via INTRAVENOUS

## 2015-09-25 MED ORDER — PROCHLORPERAZINE MALEATE 10 MG PO TABS
ORAL_TABLET | ORAL | Status: AC
  Filled 2015-09-25: qty 1

## 2015-09-25 MED ORDER — PACLITAXEL PROTEIN-BOUND CHEMO INJECTION 100 MG
75.0000 mg/m2 | Freq: Once | INTRAVENOUS | Status: AC
  Administered 2015-09-25: 125 mg via INTRAVENOUS
  Filled 2015-09-25: qty 25

## 2015-09-25 NOTE — Progress Notes (Signed)
Pt does not have a port, labs drawn peripherally by phlebotomist

## 2015-09-25 NOTE — Progress Notes (Signed)
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 Local edema without significant mass effect.      05/11/2015 Imaging    CT chest, abdomen and pelvis with contrast showed right upper lobe nodule 1.7 cm, with right hilar, bilateral mediastinal, and the thoracic inlet adenopathy.      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 The biopsy or over the right upper lobe lung nodule was negative.      05/22/2015 - 05/27/2015 Radiation Therapy    SRS to brain metastases       06/05/2015 Miscellaneous    PD-L1 IHC <1%       06/05/2015 Pathology Results    Right upper lobe lung nodule core needle biopsy showed poorly differentiated non-small cell lung cancer with associated necrosis, tumor cells are positive for cytokeratin 5/6, suggesting squamous cell carcinoma.      06/05/2015 Procedure    IR RUL lung nodule biopsy       06/19/2015 -  Chemotherapy    Carboplatin AUC 5 on D1,  and abraxane 127m/m2 on Day 1, 8 and 15, every 28 days       09/09/2015 -  Radiation Therapy    Pt received SBRT to her new brain mets       History of present illness  (05/12/2015)  Patient is a 65year old female with past medical history of hypertension, 30 year smoking history, remote history of breast cancer in 1992, presented with sudden onset episodes of difficulty speaking, drooling, and facial twitching on 09/09/2015. It lasted about 7-8 minutes. EMS was called and she was brought to the hospital. Brain scan showed multiple brain lesions, highly suspicious for metastatic disease. CT chest abdomen and pelvis revealed a 2.3 cm right upper lobe lung nodule, along was hilar and mediastinal adenopathy. Pulmonary was consulted for biopsy. I saw her in the hospital, along with 2 of her granddaughters. She feels well now, denies any significant pain, dyspnea, or other symptoms. She does have mild chronic cough nonproductive. She states that her appetite has been low and she lost about 10-15 pounds in the past year. 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 She has 2 sisters and graduations in the area.  CURRENT THERAPY: Carboplatin AUC 5 on day 1, Abraxane 100 mg/m on day 1, 8 and 15, every 28 days, started on 06/19/2015, changed to carbo AUC 5 and abraxane 749mm2 since cycle 3 due to cytopenia, cycle 4 was postponed due to brain radiation   INTERVAL HISTORY:  Mrs. SmBurkloweturns for follow-up and chemo. She tolerated chemo well last week, no significant nausea, diarrhea, or  other complaints. His appetite has improved some, overall feels slightly better. No other new complaints.   REVIEW OF SYSTEMS:   Constitutional: Denies fevers, chills or abnormal weight loss Eyes: Denies blurriness of vision Ears, nose, mouth, throat, and face: Denies mucositis or sore throat Respiratory: Denies cough, dyspnea or wheezes Cardiovascular: Denies palpitation, chest discomfort or lower extremity swelling Gastrointestinal:  Denies nausea, heartburn or change in bowel habits Skin: Denies abnormal skin rashes Lymphatics: Denies new lymphadenopathy or easy  bruising Neurological:Denies numbness, tingling or new weaknesses Behavioral/Psych: Mood is stable, no new changes  All other systems were reviewed with the patient and are negative.  MEDICAL HISTORY:  Past Medical History:  Diagnosis Date  . Asthmatic bronchitis   . Brain cancer ([place])   . Breast cancer ([place])   . Chronic bronchitis ([place])   . Family history of adverse reaction to anesthesia    "my son passed away I think related to the anesthesia"  . Hyperlipidemia   . Hypertension   . Seizures ([place]) 05/10/2015   "it may have been related to me taking Cipro" (05/11/2015)  . Sinus headache     SURGICAL HISTORY: Past Surgical History:  Procedure Laterality Date  . ABDOMINAL HYSTERECTOMY    . APPENDECTOMY    . BREAST BIOPSY Right   . BREAST LUMPECTOMY Right   . CATARACT EXTRACTION Right 08/26/2015  . LAPAROSCOPIC CHOLECYSTECTOMY    . VIDEO BRONCHOSCOPY WITH ENDOBRONCHIAL NAVIGATION N/A 05/14/2015   Procedure: VIDEO BRONCHOSCOPY WITH ENDOBRONCHIAL NAVIGATION;  Surgeon: Collene Gobble, MD;  Location: [place];  Service: Thoracic;  Laterality: N/A;  . VIDEO BRONCHOSCOPY WITH ENDOBRONCHIAL ULTRASOUND N/A 05/14/2015   Procedure: VIDEO BRONCHOSCOPY WITH ENDOBRONCHIAL ULTRASOUND;  Surgeon: Collene Gobble, MD;  Location: [place];  Service: Thoracic;  Laterality: N/A;    ALLERGIES:  has No Known Allergies.  MEDICATIONS:  Current Outpatient Prescriptions  Medication Sig Dispense Refill  . aspirin EC 81 MG tablet Take 81 mg by mouth daily.    Marland Kitchen atenolol (TENORMIN) 50 MG tablet Take 50 mg by mouth daily. Reported on 08/12/2015    . cholecalciferol (VITAMIN D) 1000 units tablet Take 1 tablet (1,000 Units total) by mouth daily. 180 tablet 6  . lactobacillus acidophilus (BACID) TABS tablet Take 1 tablet by mouth daily.     Marland Kitchen levETIRAcetam (KEPPRA) 100 MG/ML solution Take 5 mLs (500 mg total) by mouth 2 (two) times daily. 473 mL 11  . LORazepam (ATIVAN) 0.5 MG tablet Take 1 tablet (0.5 mg total) by  mouth every 6 (six) hours as needed (Nausea or vomiting). 30 tablet 0  . mirtazapine (REMERON) 15 MG tablet Take 1 tablet (15 mg total) by mouth at bedtime. 30 tablet 2  . ondansetron (ZOFRAN) 8 MG tablet Take 1 tablet (8 mg total) by mouth every 8 (eight) hours as needed for nausea or vomiting. 30 tablet 1  . pravastatin (PRAVACHOL) 20 MG tablet Take 20 mg by mouth daily. Reported on 08/12/2015    . prednisoLONE acetate (PRED FORTE) 1 % ophthalmic suspension INSTILL 1 DROP INTO SURGICAL EYE QID AFTER SURGERY  1  . prochlorperazine (COMPAZINE) 10 MG tablet Take 1 tablet (10 mg total) by mouth every 6 (six) hours as needed (Nausea or vomiting). 30 tablet 1   No current facility-administered medications for this visit.    Facility-Administered Medications Ordered in Other Visits  Medication Dose Route Frequency Provider Last Rate Last Dose  . sodium chloride 0.9 % injection 10 mL  10 mL  Intracatheter PRN Truitt Merle, MD        PHYSICAL EXAMINATION: ECOG PERFORMANCE STATUS: 1 BP 107/57, heart rate 106, respiratory rate 16, temperature 98.2, pulse ox 100% GENERAL:alert, no distress and comfortable SKIN: skin color, texture, turgor are normal, no rashes or significant lesions EYES: normal, Conjunctiva are pink and non-injected, sclera clear OROPHARYNX:no exudate, no erythema and lips, buccal mucosa, and tongue normal  NECK: supple, thyroid normal size, non-tender, without nodularity LYMPH:  no palpable lymphadenopathy in the cervical, axillary or inguinal LUNGS: clear to auscultation and percussion with normal breathing effort HEART: regular rate & rhythm and no murmurs and no lower extremity edema ABDOMEN:abdomen soft, non-tender and normal bowel sounds Musculoskeletal:no cyanosis of digits and no clubbing  NEURO: alert & oriented x 3 with fluent speech, no focal motor/sensory deficits  LABORATORY DATA:  I have reviewed the data as listed CBC Latest Ref Rng & Units 09/25/2015 09/18/2015 09/04/2015   WBC 3.9 - 10.3 10e3/uL 4.6 4.5 4.6  Hemoglobin 11.6 - 15.9 g/dL 10.2(L) 8.2(L) 8.2(L)  Hematocrit 34.8 - 46.6 % 32.1(L) 26.1(L) 26.0(L)  Platelets 145 - 400 10e3/uL 498(H) 569(H) 201     CMP Latest Ref Rng & Units 09/25/2015 09/18/2015 09/04/2015  Glucose 70 - 140 mg/dl 101 111 107  BUN 7.0 - 26.0 mg/dL 12.8 8.7 6.6(L)  Creatinine 0.6 - 1.1 mg/dL 0.7 0.7 0.7  Sodium 136 - 145 mEq/L 134(L) 137 138  Potassium 3.5 - 5.1 mEq/L 4.1 3.6 3.4(L)  Chloride 101 - 111 mmol/L - - -  CO2 22 - 29 mEq/L _0 Calcium 8.4 - 10.4 mg/dL 10.8(H) 11.0(H) 10.7(H)  Total Protein 6.4 - 8.3 g/dL 8.2 8.4(H) 7.8  Total Bilirubin 0.20 - 1.20 mg/dL 0.30 <0.30 0.30  Alkaline Phos 40 - 150 U/L 111 100 98  AST 5 - 34 U/L _1 ALT 0 - 55 U/L 13 <9 <9   PATHOLOGY REPORT Diagnosis 05/14/2015  Lung, biopsy, Right upper lobe - MILDLY INFLAMED LUNG PARENCHYMA. - THERE IS NO EVIDENCE OF MALIGNANCY.  Diagnosis 05/14/2015  FINE NEEDLE ASPIRATION: EBUS, 4R, C (SPECIMEN 3 OF , COLLECTED ON 05/14/15): MALIGNANT CELLS PRESENT, CONSISTENT WITH NON SMALL CELL CARCINOMA. SEE COMMENT. COMMENT: THERE IS INSUFFICIENT MATERIAL PRESENT FOR ADDITIONAL STUDIES.  Diagnosis 06/05/2015 Lung, needle/core biopsy(ies), right upper lobe - POORLY DIFFERENTIATED NON SMALL CELL CARCINOMA WITH ASSOCIATED NECROSIS. - SEE COMMENT. Microscopic Comment In lieu of immunohistochemical stains, tissue will be preserved for additional studies, if requested. Dr. Burr Medico was paged on 06/08/15. (JBK:gt, 06/08/15)  ADDITIONAL INFORMATION: The tumor cells are positive for cyto5/5/2017keratin 5/6, suggesting squamous cell carcinoma. Per clinician request, a block will be sent for PDL-1 testing and results reported separately. (JBK:gt, 06/09/15)   RADIOGRAPHIC STUDIES: I have personally reviewed the radiological images as listed and agreed with the findings in the report.  PET 06/10/2015 IMPRESSION: 1. Hypermetabolic 2.7 cm peripheral right upper lobe  pulmonary nodule, consistent with primary bronchogenic carcinoma. 2. Ipsilateral peribronchial, ipsilateral mediastinal and right level 4 neck nodal metastases. 3. Asymmetric foci of hypermetabolism in the left inferior frontal lobe and right cerebellar hemisphere, cannot exclude intra-axial brain metastases in these locations in this patient with known brain metastases. 4. Hypermetabolic thyroid isthmus nodule, for which a significant minority will represent thyroid malignancy. Correlate with thyroid ultrasound and ultrasound-guided fine-needle aspiration as clinically warranted. 5. No hypermetabolic metastatic disease in the abdomen, pelvis or skeleton. 6. Coronary atherosclerosis.  CT chest, abdomen and pelvis w contrast 08/10/2015 IMPRESSION:  1. Interval mixed response to therapy. 2. The primary lesion within the right upper lobe has decreased in size from previous exam. There has been interval increase in size of right supraclavicular and right paratracheal adenopathy. 3. Aortic atherosclerosis and coronary artery calcification.  IMPRESSION: Newly seen 2-3 mm lesions in the right cerebellum, medial left frontal lobe, right frontal lobe and 2 foci in the right parietal lobe. Other small lesions scattered throughout the brain are smaller or Stable.   ASSESSMENT & PLAN:  A 65 year old female with past medical history of hypertension, 15 pack year history of smoking, presented with seizure like activity, images studies reviewed multiple (at least 6) brain lesions and a 2.3 cm right upper lobe lung nodule, with hilar and mediastinal adenopathy  1. 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  - her repeated Lung biopsy showed poor differentiated squamous cell carcinoma,  PET scan showed hypermetabolic hilar, mediastinum, and   Right neck level IV nodal metastasis. -She unfortunately developed new brain metastasis, had second SBRT on August 9.. - I reviewed the natural history and incurable nature of her metastatic squamous cell lung cancer,  Her overall prognosis is very poor giving the metastatic disease. - The goal of therapy is palliative and prolong her life. -Her tumor PD-L1<1%, unfortunately first line immunotherapy is not indicated. -She is currently on first-line chemotherapy with [place] and Abraxane, which was held lately due to her SBRT  -I reviewed her restaging CT scans from 08/10/2015, which showed mixed response. Her primary right lung lesion has improved, mediastinal adenopathy is stable, right supraclavicular node slightly increased. Overall stable disease.  -She has developed more symptoms lately, and her performance status has deteriorated.  Cycle 4 with further dose reduction of carboplatin to AUC 4.  -Plan to repeat CT scans after next cycle of chemotherapy.   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 She probably has a component of iron deficient anemia also -she received iv feraheme in early July, responded well -she tolerated blood transfusion well last week, feels better overall    3. HTN, COPD -she will continue follow up with her PCP   4. 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 Depression -She has developed depression symptoms lately -I recommend her to start mirtazapine 15 mg at bedtime, for depression, anorexia and insomnia. She started last week   Plan -Lab reviewed, adequate for treatment, will proceed C4D8 Abraxane 75 mg/m today and she will return for D15 Abraxane next week  -I'll see her  back in 3 weeks before cycle 5.   All questions were answered.  The patient knows to call the clinic with any problems, questions or concerns.    Truitt Merle, MD 09/25/15

## 2015-09-25 NOTE — Progress Notes (Signed)
Pt verbalized interest in a possibility of having a port a cath inserted for future treatments. Gave pt education on what a port looks like and how its accessed so pt can have an idea of what it will feel or look under the skin. Told pt to discuss this with Dr.Feng and see if this will be a fit for her, since she has a few more chemo treatments left. Pt states that she will ask Dr. Burr Medico about her treatments and see if this is a good decision to move forward.

## 2015-09-25 NOTE — Telephone Encounter (Signed)
Gave patient avs report and appointment schedule for September. No [place] at check out. Per Dr. Burr Medico patient to keep scheduled appointments for 9/1, lab/fu to be scheduled 9/15 with another cycle of chemo day 1. Day 8 on 9/15 and 9/22. Per Dr. Burr Medico she will send message.

## 2015-09-26 ENCOUNTER — Encounter: Payer: Self-pay | Admitting: Hematology

## 2015-09-30 ENCOUNTER — Encounter: Payer: BLUE CROSS/BLUE SHIELD | Admitting: Nutrition

## 2015-09-30 ENCOUNTER — Ambulatory Visit: Payer: BLUE CROSS/BLUE SHIELD

## 2015-09-30 ENCOUNTER — Telehealth: Payer: Self-pay | Admitting: Hematology

## 2015-09-30 ENCOUNTER — Telehealth: Payer: Self-pay | Admitting: *Deleted

## 2015-09-30 ENCOUNTER — Other Ambulatory Visit: Payer: BLUE CROSS/BLUE SHIELD

## 2015-09-30 DIAGNOSIS — Z0289 Encounter for other administrative examinations: Secondary | ICD-10-CM

## 2015-09-30 NOTE — Telephone Encounter (Signed)
Scheduling note sent with High urgency.

## 2015-09-30 NOTE — Telephone Encounter (Signed)
Yes, please schedule her to see me in infusion on Friday   Thanks.   Truitt Merle MD

## 2015-09-30 NOTE — Telephone Encounter (Signed)
"  I need to be seen sooner than 10-16-2015.  About two to three days ago I started having pain to back of my neck and shoulders and shortness of breath.  Can I see her Friday when I'm there at 11:15 for lab, 11:30 flush and 12:00 chemotherapy infusion.  I've tried tylenol or tylenol PM at night.  I do not like to take medicines.  Return number [phone] or [phone]."  Offered [facility] Today, tomorrow or Friday.  Declined need to be seen today.  Denies Fever, occasional non-productive cough, no bladder or bowel changes or problems.  No N/V.

## 2015-09-30 NOTE — Telephone Encounter (Signed)
Spoke with pt to inform her of added ov 9/1 per LOS

## 2015-10-02 ENCOUNTER — Ambulatory Visit: Payer: BLUE CROSS/BLUE SHIELD

## 2015-10-02 ENCOUNTER — Ambulatory Visit (HOSPITAL_BASED_OUTPATIENT_CLINIC_OR_DEPARTMENT_OTHER): Payer: BLUE CROSS/BLUE SHIELD

## 2015-10-02 ENCOUNTER — Other Ambulatory Visit (HOSPITAL_BASED_OUTPATIENT_CLINIC_OR_DEPARTMENT_OTHER): Payer: BLUE CROSS/BLUE SHIELD

## 2015-10-02 ENCOUNTER — Ambulatory Visit (HOSPITAL_BASED_OUTPATIENT_CLINIC_OR_DEPARTMENT_OTHER): Payer: BLUE CROSS/BLUE SHIELD | Admitting: Hematology

## 2015-10-02 VITALS — BP 110/76 | HR 107 | Temp 98.8°F | Resp 17

## 2015-10-02 DIAGNOSIS — C3491 Malignant neoplasm of unspecified part of right bronchus or lung: Secondary | ICD-10-CM

## 2015-10-02 DIAGNOSIS — I10 Essential (primary) hypertension: Secondary | ICD-10-CM

## 2015-10-02 DIAGNOSIS — Z95828 Presence of other vascular implants and grafts: Secondary | ICD-10-CM

## 2015-10-02 DIAGNOSIS — C7931 Secondary malignant neoplasm of brain: Secondary | ICD-10-CM

## 2015-10-02 DIAGNOSIS — C3411 Malignant neoplasm of upper lobe, right bronchus or lung: Secondary | ICD-10-CM

## 2015-10-02 DIAGNOSIS — D630 Anemia in neoplastic disease: Secondary | ICD-10-CM

## 2015-10-02 DIAGNOSIS — F329 Major depressive disorder, single episode, unspecified: Secondary | ICD-10-CM

## 2015-10-02 DIAGNOSIS — J449 Chronic obstructive pulmonary disease, unspecified: Secondary | ICD-10-CM

## 2015-10-02 DIAGNOSIS — Z5111 Encounter for antineoplastic chemotherapy: Secondary | ICD-10-CM

## 2015-10-02 DIAGNOSIS — D509 Iron deficiency anemia, unspecified: Secondary | ICD-10-CM

## 2015-10-02 DIAGNOSIS — G620 Drug-induced polyneuropathy: Secondary | ICD-10-CM

## 2015-10-02 DIAGNOSIS — E611 Iron deficiency: Secondary | ICD-10-CM | POA: Diagnosis not present

## 2015-10-02 LAB — COMPREHENSIVE METABOLIC PANEL
ALBUMIN: 2.8 g/dL — AB (ref 3.5–5.0)
ALK PHOS: 114 U/L (ref 40–150)
ALT: 9 U/L (ref 0–55)
AST: 13 U/L (ref 5–34)
Anion Gap: 13 mEq/L — ABNORMAL HIGH (ref 3–11)
BUN: 11 mg/dL (ref 7.0–26.0)
CALCIUM: 11 mg/dL — AB (ref 8.4–10.4)
CO2: 23 mEq/L (ref 22–29)
Chloride: 100 mEq/L (ref 98–109)
Creatinine: 0.7 mg/dL (ref 0.6–1.1)
GLUCOSE: 100 mg/dL (ref 70–140)
Potassium: 4.4 mEq/L (ref 3.5–5.1)
SODIUM: 137 meq/L (ref 136–145)
TOTAL PROTEIN: 8.2 g/dL (ref 6.4–8.3)

## 2015-10-02 LAB — CBC WITH DIFFERENTIAL/PLATELET
BASO%: 0.5 % (ref 0.0–2.0)
Basophils Absolute: 0 10*3/uL (ref 0.0–0.1)
EOS ABS: 0 10*3/uL (ref 0.0–0.5)
EOS%: 0 % (ref 0.0–7.0)
HEMATOCRIT: 30 % — AB (ref 34.8–46.6)
HEMOGLOBIN: 9.4 g/dL — AB (ref 11.6–15.9)
LYMPH#: 1.2 10*3/uL (ref 0.9–3.3)
LYMPH%: 30.4 % (ref 14.0–49.7)
MCH: 27.5 pg (ref 25.1–34.0)
MCHC: 31.3 g/dL — ABNORMAL LOW (ref 31.5–36.0)
MCV: 87.7 fL (ref 79.5–101.0)
MONO#: 0.6 10*3/uL (ref 0.1–0.9)
MONO%: 14.7 % — AB (ref 0.0–14.0)
NEUT%: 54.4 % (ref 38.4–76.8)
NEUTROS ABS: 2.2 10*3/uL (ref 1.5–6.5)
Platelets: 357 10*3/uL (ref 145–400)
RBC: 3.42 10*6/uL — ABNORMAL LOW (ref 3.70–5.45)
RDW: 17 % — AB (ref 11.2–14.5)
WBC: 4 10*3/uL (ref 3.9–10.3)

## 2015-10-02 MED ORDER — SODIUM CHLORIDE 0.9 % IJ SOLN
10.0000 mL | INTRAMUSCULAR | Status: DC | PRN
  Filled 2015-10-02: qty 10

## 2015-10-02 MED ORDER — SODIUM CHLORIDE 0.9 % IV SOLN
Freq: Once | INTRAVENOUS | Status: AC
  Administered 2015-10-02: 14:00:00 via INTRAVENOUS

## 2015-10-02 MED ORDER — PACLITAXEL PROTEIN-BOUND CHEMO INJECTION 100 MG
75.0000 mg/m2 | Freq: Once | INTRAVENOUS | Status: AC
  Administered 2015-10-02: 125 mg via INTRAVENOUS
  Filled 2015-10-02: qty 25

## 2015-10-02 MED ORDER — PROCHLORPERAZINE MALEATE 10 MG PO TABS
10.0000 mg | ORAL_TABLET | Freq: Once | ORAL | Status: AC
  Administered 2015-10-02: 10 mg via ORAL

## 2015-10-02 MED ORDER — PROCHLORPERAZINE MALEATE 10 MG PO TABS
ORAL_TABLET | ORAL | Status: AC
  Filled 2015-10-02: qty 1

## 2015-10-02 NOTE — Progress Notes (Signed)
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 Local edema without significant mass effect.      05/11/2015 Imaging    CT chest, abdomen and pelvis with contrast showed right upper lobe nodule 1.7 cm, with right hilar, bilateral mediastinal, and the thoracic inlet adenopathy.      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 The biopsy or over the right upper lobe lung nodule was negative.      05/22/2015 - 05/27/2015 Radiation Therapy    SRS to brain metastases       06/05/2015 Miscellaneous    PD-L1 IHC <1%       06/05/2015 Pathology Results    Right upper lobe lung nodule core needle biopsy showed poorly differentiated non-small cell lung cancer with associated necrosis, tumor cells are positive for cytokeratin 5/6, suggesting squamous cell carcinoma.      06/05/2015 Procedure    IR RUL lung nodule biopsy       06/19/2015 -  Chemotherapy    Carboplatin AUC 5 on D1,  and abraxane 199m/m2 on Day 1, 8 and 15, every 28 days       09/09/2015 -  Radiation Therapy    Pt received SBRT to her new brain mets       History of present illness  (05/12/2015)  Patient is a 65year old female with past medical history of hypertension, 30 year smoking history, remote history of breast cancer in 1992, presented with sudden onset episodes of difficulty speaking, drooling, and facial twitching on 09/09/2015. It lasted about 7-8 minutes. EMS was called and she was brought to the [name]. Brain scan showed multiple brain lesions, highly suspicious for metastatic disease. CT chest abdomen and pelvis revealed a 2.3 cm right upper lobe lung nodule, along was hilar and mediastinal adenopathy. Pulmonary was consulted for biopsy. I saw her in the [name], along with 2 of her granddaughters. She feels well now, denies any significant pain, dyspnea, or other symptoms. She does have mild chronic cough nonproductive. She states that her appetite has been low and she lost about 10-15 pounds in the past year. 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 She has 2 sisters and graduations in the area.  CURRENT THERAPY: Carboplatin AUC 5 on day 1, Abraxane 100 mg/m on day 1, 8 and 15, every 28 days, started on 06/19/2015, changed to carbo AUC 5 and abraxane 764mm2 since cycle 3 due to cytopenia, cycle 4 was postponed due to brain radiation   INTERVAL HISTORY:  Mrs. SmGordnereturns for follow-up and chemo. She tolerated chemo well in the pat two weeks, no significant  nausea, or diarrhea. Her appetite seems improved some, she eats 3 smalls meals, gained two lbs last week. Her speech and reaction is still little slow, no headache or other neurological symptoms.   REVIEW OF SYSTEMS:   Constitutional: Denies fevers, chills or abnormal weight loss Eyes: Denies blurriness of vision Ears, nose, mouth, throat, and face: Denies mucositis or sore throat Respiratory: Denies cough, dyspnea or wheezes Cardiovascular: Denies palpitation, chest discomfort or lower extremity swelling Gastrointestinal:  Denies nausea, heartburn or change in bowel  habits Skin: Denies abnormal skin rashes Lymphatics: Denies new lymphadenopathy or easy bruising Neurological:Denies numbness, tingling or new weaknesses Behavioral/Psych: Mood is stable, no new changes  All other systems were reviewed with the patient and are negative.  MEDICAL HISTORY:  Past Medical History:  Diagnosis Date  . Asthmatic bronchitis   . Brain cancer ([place])   . Breast cancer ([place])   . Chronic bronchitis ([place])   . Family history of adverse reaction to anesthesia    "my son passed away I think related to the anesthesia"  . Hyperlipidemia   . Hypertension   . Seizures ([place]) 05/10/2015   "it may have been related to me taking Cipro" (05/11/2015)  . Sinus headache     SURGICAL HISTORY: Past Surgical History:  Procedure Laterality Date  . ABDOMINAL HYSTERECTOMY    . APPENDECTOMY    . BREAST BIOPSY Right   . BREAST LUMPECTOMY Right   . CATARACT EXTRACTION Right 08/26/2015  . LAPAROSCOPIC CHOLECYSTECTOMY    . VIDEO BRONCHOSCOPY WITH ENDOBRONCHIAL NAVIGATION N/A 05/14/2015   Procedure: VIDEO BRONCHOSCOPY WITH ENDOBRONCHIAL NAVIGATION;  Surgeon: Rose Gobble, MD;  Location: [facility];  Service: Thoracic;  Laterality: N/A;  . VIDEO BRONCHOSCOPY WITH ENDOBRONCHIAL ULTRASOUND N/A 05/14/2015   Procedure: VIDEO BRONCHOSCOPY WITH ENDOBRONCHIAL ULTRASOUND;  Surgeon: Rose Gobble, MD;  Location: [facility];  Service: Thoracic;  Laterality: N/A;    ALLERGIES:  has No Known Allergies.  MEDICATIONS:  Current Outpatient Prescriptions  Medication Sig Dispense Refill  . aspirin EC 81 MG tablet Take 81 mg by mouth daily.    Marland Kitchen atenolol (TENORMIN) 50 MG tablet Take 50 mg by mouth daily. Reported on 08/12/2015    . cholecalciferol (VITAMIN D) 1000 units tablet Take 1 tablet (1,000 Units total) by mouth daily. 180 tablet 6  . lactobacillus acidophilus (BACID) TABS tablet Take 1 tablet by mouth daily.     Marland Kitchen levETIRAcetam (KEPPRA) 100 MG/ML solution Take 5 mLs (500 mg total) by mouth 2 (two)  times daily. 473 mL 11  . LORazepam (ATIVAN) 0.5 MG tablet Take 1 tablet (0.5 mg total) by mouth every 6 (six) hours as needed (Nausea or vomiting). 30 tablet 0  . mirtazapine (REMERON) 15 MG tablet Take 1 tablet (15 mg total) by mouth at bedtime. 30 tablet 2  . ondansetron (ZOFRAN) 8 MG tablet Take 1 tablet (8 mg total) by mouth every 8 (eight) hours as needed for nausea or vomiting. 30 tablet 1  . pravastatin (PRAVACHOL) 20 MG tablet Take 20 mg by mouth daily. Reported on 08/12/2015    . prednisoLONE acetate (PRED FORTE) 1 % ophthalmic suspension INSTILL 1 DROP INTO SURGICAL EYE QID AFTER SURGERY  1  . prochlorperazine (COMPAZINE) 10 MG tablet Take 1 tablet (10 mg total) by mouth every 6 (six) hours as needed (Nausea or vomiting). 30 tablet 1   No current facility-administered medications for this visit.    Facility-Administered Medications Ordered in Other Visits  Medication Dose Route Frequency  Provider Last Rate Last Dose  . sodium chloride 0.9 % injection 10 mL  10 mL Intracatheter PRN Rose Merle, MD        PHYSICAL EXAMINATION: ECOG PERFORMANCE STATUS: 1 BP 110/76, HR 107, RR 17, O2sat 100% on RA  GENERAL:alert, no distress and comfortable SKIN: skin color, texture, turgor are normal, no rashes or significant lesions EYES: normal, Conjunctiva are pink and non-injected, sclera clear OROPHARYNX:no exudate, no erythema and lips, buccal mucosa, and tongue normal  NECK: supple, thyroid normal size, non-tender, without nodularity LYMPH:  no palpable lymphadenopathy in the cervical, axillary or inguinal LUNGS: clear to auscultation and percussion with normal breathing effort HEART: regular rate & rhythm and no murmurs and no lower extremity edema ABDOMEN:abdomen soft, non-tender and normal bowel sounds Musculoskeletal:no cyanosis of digits and no clubbing  NEURO: alert & oriented x 3 with fluent speech, no focal motor/sensory deficits  LABORATORY DATA:  I have reviewed the data as  listed CBC Latest Ref Rng & Units 10/02/2015 09/25/2015 09/18/2015  WBC 3.9 - 10.3 10e3/uL 4.0 4.6 4.5  Hemoglobin 11.6 - 15.9 g/dL 9.4(L) 10.2(L) 8.2(L)  Hematocrit 34.8 - 46.6 % 30.0(L) 32.1(L) 26.1(L)  Platelets 145 - 400 10e3/uL 357 498(H) 569(H)     CMP Latest Ref Rng & Units 10/02/2015 09/25/2015 09/18/2015  Glucose 70 - 140 mg/dl 100 101 111  BUN 7.0 - 26.0 mg/dL 11.0 12.8 8.7  Creatinine 0.6 - 1.1 mg/dL 0.7 0.7 0.7  Sodium 136 - 145 mEq/L 137 134(L) 137  Potassium 3.5 - 5.1 mEq/L 4.4 4.1 3.6  Chloride 101 - 111 mmol/L - - -  CO2 22 - 29 mEq/L _0 Calcium 8.4 - 10.4 mg/dL 11.0(H) 10.8(H) 11.0(H)  Total Protein 6.4 - 8.3 g/dL 8.2 8.2 8.4(H)  Total Bilirubin 0.20 - 1.20 mg/dL <0.30 0.30 <0.30  Alkaline Phos 40 - 150 U/L 114 111 100  AST 5 - 34 U/L _1 ALT 0 - 55 U/L 9 13 <9   PATHOLOGY REPORT Diagnosis 05/14/2015  Lung, biopsy, Right upper lobe - MILDLY INFLAMED LUNG PARENCHYMA. - THERE IS NO EVIDENCE OF MALIGNANCY.  Diagnosis 05/14/2015  FINE NEEDLE ASPIRATION: EBUS, 4R, C (SPECIMEN 3 OF , COLLECTED ON 05/14/15): MALIGNANT CELLS PRESENT, CONSISTENT WITH NON SMALL CELL CARCINOMA. SEE COMMENT. COMMENT: THERE IS INSUFFICIENT MATERIAL PRESENT FOR ADDITIONAL STUDIES.  Diagnosis 06/05/2015 Lung, needle/core biopsy(ies), right upper lobe - POORLY DIFFERENTIATED NON SMALL CELL CARCINOMA WITH ASSOCIATED NECROSIS. - SEE COMMENT. Microscopic Comment In lieu of immunohistochemical stains, tissue will be preserved for additional studies, if requested. Dr. Burr Medico was paged on 06/08/15. (JBK:gt, 06/08/15)  ADDITIONAL INFORMATION: The tumor cells are positive for cyto5/5/2017keratin 5/6, suggesting squamous cell carcinoma. Per clinician request, a block will be sent for PDL-1 testing and results reported separately. (JBK:gt, 06/09/15)   RADIOGRAPHIC STUDIES: I have personally reviewed the radiological images as listed and agreed with the findings in the report.  PET  06/10/2015 IMPRESSION: 1. Hypermetabolic 2.7 cm peripheral right upper lobe pulmonary nodule, consistent with primary bronchogenic carcinoma. 2. Ipsilateral peribronchial, ipsilateral mediastinal and right level 4 neck nodal metastases. 3. Asymmetric foci of hypermetabolism in the left inferior frontal lobe and right cerebellar hemisphere, cannot exclude intra-axial brain metastases in these locations in this patient with known brain metastases. 4. Hypermetabolic thyroid isthmus nodule, for which a significant minority will represent thyroid malignancy. Correlate with thyroid ultrasound and ultrasound-guided fine-needle aspiration as clinically warranted. 5. No hypermetabolic metastatic disease in the abdomen, pelvis  or skeleton. 6. Coronary atherosclerosis.  CT chest, abdomen and pelvis w contrast 08/10/2015 IMPRESSION: 1. Interval mixed response to therapy. 2. The primary lesion within the right upper lobe has decreased in size from previous exam. There has been interval increase in size of right supraclavicular and right paratracheal adenopathy. 3. Aortic atherosclerosis and coronary artery calcification.  IMPRESSION: Newly seen 2-3 mm lesions in the right cerebellum, medial left frontal lobe, right frontal lobe and 2 foci in the right parietal lobe. Other small lesions scattered throughout the brain are smaller or Stable.   ASSESSMENT & PLAN:  A 65 year old female with past medical history of hypertension, 15 pack year history of smoking, presented with seizure like activity, images studies reviewed multiple (at least 6) brain lesions and a 2.3 cm right upper lobe lung nodule, with hilar and mediastinal adenopathy  1. 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  - her repeated Lung biopsy showed poor differentiated squamous  cell carcinoma,  PET scan showed hypermetabolic hilar, mediastinum, and  Right neck level IV nodal metastasis. -She unfortunately developed new brain metastasis, had second SBRT on August 9.. - I reviewed the natural history and incurable nature of her metastatic squamous cell lung cancer,  Her overall prognosis is very poor giving the metastatic disease. - The goal of therapy is palliative and prolong her life. -Her tumor PD-L1<1%, unfortunately first line immunotherapy is not indicated. -She is currently on first-line chemotherapy with [place] and Abraxane, which was held lately due to her SBRT  -I reviewed her restaging CT scans from 08/10/2015, which showed mixed response. Her primary right lung lesion has improved, mediastinal adenopathy is stable, right supraclavicular node slightly increased. Overall stable disease.  -She has developed more symptoms lately, and her performance status has deteriorated.  Cycle 4 with further dose reduction of carboplatin to AUC 4.  -Plan to repeat CT scans after next cycle of chemotherapy.   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 She probably has a component of iron deficient anemia also -she received iv feraheme in early July, responded well -she tolerated blood transfusion well, feels better overall    3. HTN, COPD -she will continue follow up with her PCP   4. 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 Depression -She has developed depression symptoms lately -I recommend her to start mirtazapine 15 mg at bedtime, for depression, anorexia and insomnia. She started on 09/20/15  Plan -Lab reviewed, adequate for treatment, will proceed C4D15 Abraxane 75 mg/m today   -I'll see her  back in 2 weeks before cycle 5 day 1  treatment. If her PS improves, will increase [place]  All questions were answered. The patient knows to call the clinic with any problems, questions or concerns.    Rose Merle, MD 10/02/15

## 2015-10-02 NOTE — Patient Instructions (Signed)
[facility] Discharge Instructions for Patients Receiving Chemotherapy  Today you received the following chemotherapy agents ABRAXANE To help prevent nausea and vomiting after your treatment, we encourage you to take your nausea medication   If you develop nausea and vomiting that is not controlled by your nausea medication, call the clinic.   BELOW ARE SYMPTOMS THAT SHOULD BE REPORTED IMMEDIATELY:  *FEVER GREATER THAN 100.5 F  *CHILLS WITH OR WITHOUT FEVER  NAUSEA AND VOMITING THAT IS NOT CONTROLLED WITH YOUR NAUSEA MEDICATION  *UNUSUAL SHORTNESS OF BREATH  *UNUSUAL BRUISING OR BLEEDING  TENDERNESS IN MOUTH AND THROAT WITH OR WITHOUT PRESENCE OF ULCERS  *URINARY PROBLEMS  *BOWEL PROBLEMS  UNUSUAL RASH Items with * indicate a potential emergency and should be followed up as soon as possible.  Feel free to call the clinic you have any questions or concerns. The clinic phone number is [phone].  Please show the [place] at check-in to the Emergency Department and triage nurse.

## 2015-10-03 ENCOUNTER — Encounter: Payer: Self-pay | Admitting: Hematology

## 2015-10-12 ENCOUNTER — Inpatient Hospital Stay: Admission: RE | Admit: 2015-10-12 | Payer: Self-pay | Source: Ambulatory Visit | Admitting: Radiation Oncology

## 2015-10-13 ENCOUNTER — Other Ambulatory Visit: Payer: Self-pay | Admitting: Internal Medicine

## 2015-10-13 DIAGNOSIS — E079 Disorder of thyroid, unspecified: Secondary | ICD-10-CM

## 2015-10-14 ENCOUNTER — Ambulatory Visit: Payer: BLUE CROSS/BLUE SHIELD

## 2015-10-14 ENCOUNTER — Other Ambulatory Visit: Payer: BLUE CROSS/BLUE SHIELD

## 2015-10-14 ENCOUNTER — Ambulatory Visit
Admission: RE | Admit: 2015-10-14 | Discharge: 2015-10-14 | Disposition: A | Discharge status: Home / Self Care | Payer: BLUE CROSS/BLUE SHIELD | Source: Ambulatory Visit | Attending: Internal Medicine | Admitting: Internal Medicine

## 2015-10-14 ENCOUNTER — Inpatient Hospital Stay
Admission: RE | Admit: 2015-10-14 | Discharge: 2015-10-14 | Disposition: A | Discharge status: Home / Self Care | Payer: Self-pay | Source: Ambulatory Visit | Attending: Radiation Oncology | Admitting: Radiation Oncology

## 2015-10-14 DIAGNOSIS — E079 Disorder of thyroid, unspecified: Secondary | ICD-10-CM

## 2015-10-16 ENCOUNTER — Encounter: Payer: Self-pay | Admitting: Hematology

## 2015-10-16 ENCOUNTER — Ambulatory Visit (HOSPITAL_BASED_OUTPATIENT_CLINIC_OR_DEPARTMENT_OTHER): Payer: BLUE CROSS/BLUE SHIELD

## 2015-10-16 ENCOUNTER — Telehealth: Payer: Self-pay | Admitting: *Deleted

## 2015-10-16 ENCOUNTER — Other Ambulatory Visit (HOSPITAL_BASED_OUTPATIENT_CLINIC_OR_DEPARTMENT_OTHER): Payer: BLUE CROSS/BLUE SHIELD

## 2015-10-16 ENCOUNTER — Telehealth: Payer: Self-pay | Admitting: Hematology

## 2015-10-16 ENCOUNTER — Ambulatory Visit
Admission: RE | Admit: 2015-10-16 | Discharge: 2015-10-16 | Disposition: A | Discharge status: Home / Self Care | Payer: BLUE CROSS/BLUE SHIELD | Source: Ambulatory Visit | Attending: Radiation Oncology | Admitting: Radiation Oncology

## 2015-10-16 ENCOUNTER — Ambulatory Visit (HOSPITAL_BASED_OUTPATIENT_CLINIC_OR_DEPARTMENT_OTHER): Payer: BLUE CROSS/BLUE SHIELD | Admitting: Hematology

## 2015-10-16 VITALS — BP 123/56 | HR 108 | Temp 98.9°F | Resp 17 | Ht 61.0 in | Wt 115.6 lb

## 2015-10-16 VITALS — BP 131/66 | HR 102 | Resp 18

## 2015-10-16 DIAGNOSIS — C7931 Secondary malignant neoplasm of brain: Secondary | ICD-10-CM

## 2015-10-16 DIAGNOSIS — D630 Anemia in neoplastic disease: Secondary | ICD-10-CM | POA: Diagnosis not present

## 2015-10-16 DIAGNOSIS — G622 Polyneuropathy due to other toxic agents: Secondary | ICD-10-CM

## 2015-10-16 DIAGNOSIS — D638 Anemia in other chronic diseases classified elsewhere: Secondary | ICD-10-CM

## 2015-10-16 DIAGNOSIS — J449 Chronic obstructive pulmonary disease, unspecified: Secondary | ICD-10-CM

## 2015-10-16 DIAGNOSIS — C3411 Malignant neoplasm of upper lobe, right bronchus or lung: Secondary | ICD-10-CM | POA: Diagnosis not present

## 2015-10-16 DIAGNOSIS — C779 Secondary and unspecified malignant neoplasm of lymph node, unspecified: Secondary | ICD-10-CM

## 2015-10-16 DIAGNOSIS — F329 Major depressive disorder, single episode, unspecified: Secondary | ICD-10-CM

## 2015-10-16 DIAGNOSIS — D509 Iron deficiency anemia, unspecified: Secondary | ICD-10-CM

## 2015-10-16 DIAGNOSIS — Z5111 Encounter for antineoplastic chemotherapy: Secondary | ICD-10-CM

## 2015-10-16 DIAGNOSIS — C3491 Malignant neoplasm of unspecified part of right bronchus or lung: Secondary | ICD-10-CM

## 2015-10-16 DIAGNOSIS — F32A Depression, unspecified: Secondary | ICD-10-CM

## 2015-10-16 DIAGNOSIS — I10 Essential (primary) hypertension: Secondary | ICD-10-CM

## 2015-10-16 LAB — CBC WITH DIFFERENTIAL/PLATELET
BASO%: 0.4 % (ref 0.0–2.0)
Basophils Absolute: 0 10*3/uL (ref 0.0–0.1)
EOS ABS: 0 10*3/uL (ref 0.0–0.5)
EOS%: 0.4 % (ref 0.0–7.0)
HCT: 28.4 % — ABNORMAL LOW (ref 34.8–46.6)
HEMOGLOBIN: 9.2 g/dL — AB (ref 11.6–15.9)
LYMPH%: 25.5 % (ref 14.0–49.7)
MCH: 27.8 pg (ref 25.1–34.0)
MCHC: 32.4 g/dL (ref 31.5–36.0)
MCV: 85.8 fL (ref 79.5–101.0)
MONO#: 0.5 10*3/uL (ref 0.1–0.9)
MONO%: 9.4 % (ref 0.0–14.0)
NEUT%: 64.3 % (ref 38.4–76.8)
NEUTROS ABS: 3.6 10*3/uL (ref 1.5–6.5)
Platelets: 312 10*3/uL (ref 145–400)
RBC: 3.31 10*6/uL — AB (ref 3.70–5.45)
RDW: 16.5 % — AB (ref 11.2–14.5)
WBC: 5.6 10*3/uL (ref 3.9–10.3)
lymph#: 1.4 10*3/uL (ref 0.9–3.3)

## 2015-10-16 LAB — COMPREHENSIVE METABOLIC PANEL
ALBUMIN: 2.7 g/dL — AB (ref 3.5–5.0)
ALK PHOS: 124 U/L (ref 40–150)
ALT: 12 U/L (ref 0–55)
AST: 12 U/L (ref 5–34)
Anion Gap: 13 mEq/L — ABNORMAL HIGH (ref 3–11)
BILIRUBIN TOTAL: 0.3 mg/dL (ref 0.20–1.20)
BUN: 9.4 mg/dL (ref 7.0–26.0)
CO2: 22 meq/L (ref 22–29)
Calcium: 11 mg/dL — ABNORMAL HIGH (ref 8.4–10.4)
Chloride: 100 mEq/L (ref 98–109)
Creatinine: 0.6 mg/dL (ref 0.6–1.1)
Glucose: 104 mg/dl (ref 70–140)
Potassium: 4 mEq/L (ref 3.5–5.1)
SODIUM: 135 meq/L — AB (ref 136–145)
TOTAL PROTEIN: 8.3 g/dL (ref 6.4–8.3)

## 2015-10-16 MED ORDER — PACLITAXEL PROTEIN-BOUND CHEMO INJECTION 100 MG
75.0000 mg/m2 | Freq: Once | INTRAVENOUS | Status: AC
  Administered 2015-10-16: 125 mg via INTRAVENOUS
  Filled 2015-10-16: qty 25

## 2015-10-16 MED ORDER — SODIUM CHLORIDE 0.9 % IV SOLN
431.5000 mg | Freq: Once | INTRAVENOUS | Status: AC
  Administered 2015-10-16: 430 mg via INTRAVENOUS
  Filled 2015-10-16: qty 43

## 2015-10-16 MED ORDER — PALONOSETRON HCL INJECTION 0.25 MG/5ML
INTRAVENOUS | Status: AC
  Filled 2015-10-16: qty 5

## 2015-10-16 MED ORDER — SODIUM CHLORIDE 0.9 % IV SOLN
10.0000 mg | Freq: Once | INTRAVENOUS | Status: AC
  Administered 2015-10-16: 10 mg via INTRAVENOUS
  Filled 2015-10-16: qty 1

## 2015-10-16 MED ORDER — SODIUM CHLORIDE 0.9 % IV SOLN
Freq: Once | INTRAVENOUS | Status: AC
  Administered 2015-10-16: 15:00:00 via INTRAVENOUS

## 2015-10-16 MED ORDER — PALONOSETRON HCL INJECTION 0.25 MG/5ML
0.2500 mg | Freq: Once | INTRAVENOUS | Status: AC
  Administered 2015-10-16: 0.25 mg via INTRAVENOUS

## 2015-10-16 MED ORDER — SODIUM CHLORIDE 0.9 % IV SOLN
Freq: Once | INTRAVENOUS | Status: AC
  Administered 2015-10-16: 14:00:00 via INTRAVENOUS

## 2015-10-16 NOTE — Telephone Encounter (Signed)
Avs report and appointment schedule, given to patient, per 10/16/15 los.

## 2015-10-16 NOTE — Patient Instructions (Signed)
[facility] Discharge Instructions for Patients Receiving Chemotherapy  Today you received the following chemotherapy agents :  Abraxane,  Carboplatin.  To help prevent nausea and vomiting after your treatment, we encourage you to take your nausea medication as prescribed.   If you develop nausea and vomiting that is not controlled by your nausea medication, call the clinic.   BELOW ARE SYMPTOMS THAT SHOULD BE REPORTED IMMEDIATELY:  *FEVER GREATER THAN 100.5 F  *CHILLS WITH OR WITHOUT FEVER  NAUSEA AND VOMITING THAT IS NOT CONTROLLED WITH YOUR NAUSEA MEDICATION  *UNUSUAL SHORTNESS OF BREATH  *UNUSUAL BRUISING OR BLEEDING  TENDERNESS IN MOUTH AND THROAT WITH OR WITHOUT PRESENCE OF ULCERS  *URINARY PROBLEMS  *BOWEL PROBLEMS  UNUSUAL RASH Items with * indicate a potential emergency and should be followed up as soon as possible.  Feel free to call the clinic you have any questions or concerns. The clinic phone number is [phone].  Please show the [facility] at check-in to the Emergency Department and triage nurse.

## 2015-10-16 NOTE — Progress Notes (Signed)
Radiation Oncology         [phone] ________________________________  Name: [name] MRN: [id number]  Date: 10/16/2015  DOB: Sep 10, 1950  Post Treatment Note  CC: Rogers Blocker, MD  Bonnielee Haff, MD  Diagnosis:   Stage IV NSCL, squamous cell carcinoma of the right lung with brain metastases.  Interval Since Last Radiation:  5 weeks   SRS Treatment 09/09/2015: 1.   PTVs 7-11 were treated using 11 Arcs to a prescription dose of 20 Gy using a single isocenter technique.. ExacTrac Snap verification was performed for each couch angle.   [place] Treatment 06/03/2015:  1. PTVs 1-6 were treated using 21 Arcs in total to a prescription dose of 20 Gy. ExacTrac Snap verification was performed for each couch angle.   Narrative:  The patient returns today for routine follow-up.  She tolerated radiotherapy well without incident. She is receiving chemotherapy today and is seen in the chemo suite.                            On review of systems, the patient states she is doing very well in tolerating chemotherapy. She complains of insomnia and has tried OTC benadryl and tylenol pm without improvement. She denies any headaches, visual or auditory disturbances. She denies any nausea or vomiting, gait changes, or imbalance. She is not experiencing chest pain, shortness of breath, fevers, or chills. No other complaints are noted.  ALLERGIES:  has No Known Allergies.  Meds: Current Outpatient Prescriptions  Medication Sig Dispense Refill  . aspirin EC 81 MG tablet Take 81 mg by mouth daily.    Marland Kitchen atenolol (TENORMIN) 50 MG tablet Take 50 mg by mouth daily. Reported on 08/12/2015    . cholecalciferol (VITAMIN D) 1000 units tablet Take 1 tablet (1,000 Units total) by mouth daily. 180 tablet 6  . lactobacillus acidophilus (BACID) TABS tablet Take 1 tablet by mouth daily.     Marland Kitchen levETIRAcetam (KEPPRA) 100 MG/ML solution Take 5 mLs (500 mg total) by mouth 2 (two) times daily. 473 mL 11  . LORazepam  (ATIVAN) 0.5 MG tablet Take 1 tablet (0.5 mg total) by mouth every 6 (six) hours as needed (Nausea or vomiting). 30 tablet 0  . mirtazapine (REMERON) 15 MG tablet Take 1 tablet (15 mg total) by mouth at bedtime. 30 tablet 2  . ondansetron (ZOFRAN) 8 MG tablet Take 1 tablet (8 mg total) by mouth every 8 (eight) hours as needed for nausea or vomiting. 30 tablet 1  . pravastatin (PRAVACHOL) 20 MG tablet Take 20 mg by mouth daily. Reported on 08/12/2015    . prednisoLONE acetate (PRED FORTE) 1 % ophthalmic suspension INSTILL 1 DROP INTO SURGICAL EYE QID AFTER SURGERY  1  . prochlorperazine (COMPAZINE) 10 MG tablet Take 1 tablet (10 mg total) by mouth every 6 (six) hours as needed (Nausea or vomiting). 30 tablet 1   No current facility-administered medications for this encounter.    Facility-Administered Medications Ordered in Other Encounters  Medication Dose Route Frequency Provider Last Rate Last Dose  . CARBOplatin (PARAPLATIN) 430 mg in sodium chloride 0.9 % 250 mL chemo infusion  430 mg Intravenous Once Truitt Merle, MD      . dexamethasone (DECADRON) 10 mg in sodium chloride 0.9 % 50 mL IVPB  10 mg Intravenous Once Truitt Merle, MD      . PACLitaxel-protein bound (ABRAXANE) chemo infusion 125 mg  75 mg/m2 (Treatment Plan Recorded) Intravenous  Once Truitt Merle, MD      . palonosetron Leona Carry) injection 0.25 mg  0.25 mg Intravenous Once Truitt Merle, MD      . sodium chloride 0.9 % injection 10 mL  10 mL Intracatheter PRN Truitt Merle, MD        Physical Findings: Wt Readings from Last 3 Encounters:  10/16/15 115 lb 9.6 oz (52.4 kg)  09/18/15 119 lb 3.2 oz (54.1 kg)  09/16/15 117 lb 6.4 oz (53.3 kg)   Temp Readings from Last 3 Encounters:  10/16/15 98.9 F (37.2 C) (Oral)  10/02/15 98.8 F (37.1 C) (Oral)  09/25/15 98.2 F (36.8 C) (Oral)   BP Readings from Last 3 Encounters:  10/16/15 131/66  10/16/15 (!) 123/56  10/02/15 110/76   Pulse Readings from Last 3 Encounters:  10/16/15 (!) 102  10/16/15  (!) 108  10/02/15 (!) 107   In general this is a well appearing African American female in no acute distress. She's alert and oriented x4 and appropriate throughout the examination. Cardiopulmonary assessment is negative for acute distress and she exhibits normal effort.   Lab Findings: Lab Results  Component Value Date   WBC 5.6 10/16/2015   HGB 9.2 (L) 10/16/2015   HCT 28.4 (L) 10/16/2015   MCV 85.8 10/16/2015   PLT 312 10/16/2015     Radiographic Findings: US Thyroid  Result Date: 10/14/2015 CLINICAL DATA:  65 year old female with a history of palpable right neck mass. Lung cancer. EXAM: THYROID ULTRASOUND TECHNIQUE: Ultrasound examination of the thyroid gland and adjacent soft tissues was performed. COMPARISON:  CT 08/10/2015, PET-CT 06/10/2015 FINDINGS: Parenchymal Echotexture: Normal Estimated total number of nodules >/= 1 cm: 2 Number of spongiform nodules > 2 cm not described below (TR1): 0 Number of mixed cystic and solid nodules > 1.5 cm not described below ([place]): 0 _________________________________________________________ Isthmus: 0.4 cm Nodule # 1: Location: Isthmus Size: 1.2 cm x 0.6 cm x 0.9 cm Composition: solid/almost completely solid (2) Echogenicity: hypoechoic (2) Shape: not taller-than-wide (0) Margins: smooth (0) Echogenic foci: none (0) ACR TI-RADS total points: 4. ACR TI-RADS risk category: TR4 (4-6 points). ACR TI-RADS recommendations: Nodule meets criteria for surveillance. _________________________________________________________ Right lobe: 4.5 cm x 1.3 cm x 1.4 cm Nodule # 1: Location: Right; Inferior Size: 1.1 cm x 0.6 cm x 0.6 cm Composition: solid/almost completely solid (2) Echogenicity: isoechoic (1) Shape: not taller-than-wide (0) Margins: smooth (0) Echogenic foci: none (0) ACR TI-RADS total points: 2. ACR TI-RADS risk category: TR2 (2 points). ACR TI-RADS recommendations: Nodule does not meet criteria for surveillance or biopsy.  _________________________________________________________ Left lobe: 4.1 cm x 1.1 cm by 1.3 cm Nodule # 1: Location: Left; Size: 0.6 cm x 0.3 cm x 0.5 cm Composition: solid/almost completely solid (2) Echogenicity: isoechoic (1) Shape: not taller-than-wide (0) Margins: smooth (0) Echogenic foci: none (0) ACR TI-RADS total points: 3. ACR TI-RADS risk category: TR3 (3 points). ACR TI-RADS recommendations: Nodule does not meet criteria for biopsy or surveillance. Additional: Rounded hypoechoic soft tissue nodule inferior and lateral to the right thyroid lobe measuring as great as 3 cm. Nodule was present on comparison CT study. IMPRESSION: Multinodular thyroid. No thyroid nodule meets criteria for biopsy or surveillance, as designated by the newly established ACR TI-RADS criteria. Recommendations follow those established by the new ACR TI-RADS criteria (J Am Coll Radiol 5102;58:527-782). Soft tissue nodule inferior and lateral to the right thyroid lobe, concerning for metastatic implant given the findings on recent CT imaging and PET. This nodule is  separate from the thyroid lobe. Signed, Dulcy Fanny. Earleen Newport, DO [facility] Radiology Electronically Signed   By: Corrie Mckusick D.O.   On: 10/14/2015 14:49    Impression/Plan: 1. Stage IV NSCL, squamous cell carcinoma of the right lung with brain metastases.the patient appears to be doing clinically well after radiation. We will plan to proceed with her next MRI scan in 3 months. She states agreement and understanding, and we will coordinate this with her. She will continue her chemotherapy regimen per Dr. Burr Medico. 2. Insomnia. We discussed the use of OTC melatonin. She will try this and keep [place] informed of her progress.     Carola Rhine, PAC

## 2015-10-16 NOTE — Telephone Encounter (Signed)
Per LOS I have scheduled appts and notified the scheduler 

## 2015-10-16 NOTE — Addendum Note (Signed)
Addended by: Truitt Merle on: 10/16/2015 02:28 PM   Modules accepted: Orders

## 2015-10-16 NOTE — Progress Notes (Signed)
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 Local edema without significant mass effect.      05/11/2015 Imaging    CT chest, abdomen and pelvis with contrast showed right upper lobe nodule 1.7 cm, with right hilar, bilateral mediastinal, and the thoracic inlet adenopathy.      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 The biopsy or over the right upper lobe lung nodule was negative.      05/22/2015 - 05/27/2015 Radiation Therapy    SRS to brain metastases       06/05/2015 Miscellaneous    PD-L1 IHC <1%       06/05/2015 Pathology Results    Right upper lobe lung nodule core needle biopsy showed poorly differentiated non-small cell lung cancer with associated necrosis, tumor cells are positive for cytokeratin 5/6, suggesting squamous cell carcinoma.      06/05/2015 Procedure    IR RUL lung nodule biopsy       06/19/2015 -  Chemotherapy    Carboplatin AUC 5 on D1,  and abraxane 167m/m2 on Day 1, 8 and 15, every 28 days       09/09/2015 -  Radiation Therapy    Pt received SBRT to her new brain mets       History of present illness  (05/12/2015)  Rose [name] [name] with past medical history of hypertension, 30 year smoking history, remote history of breast cancer in 1992, presented with sudden onset episodes of difficulty speaking, drooling, and facial twitching on 09/09/2015. It lasted about 7-8 minutes. EMS was called and she was brought to the hospital. Brain scan showed multiple brain lesions, highly suspicious for metastatic disease. CT chest abdomen and pelvis revealed a 2.3 cm right upper lobe lung nodule, along was hilar and mediastinal adenopathy. Pulmonary was consulted for biopsy. I saw her in the hospital, along with 2 of her granddaughters. She feels well now, denies any significant pain, dyspnea, or other symptoms. She does have mild chronic cough nonproductive. She states that her appetite has been low and she lost about 10-15 pounds in the past year. 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 She has 2 sisters and graduations in the area.  CURRENT THERAPY: Carboplatin AUC 5 on day 1, Abraxane 100 mg/m on day 1, 8 and 15, every 28 days, started on 06/19/2015, changed to carbo AUC 5 and abraxane 798mm2 since cycle 3 due to cytopenia, cycle 4 was postponed due to brain radiation   INTERVAL HISTORY:  [name] for follow-up and chemo. She tolerated last   REVIEW OF SYSTEMS:   Constitutional:  Denies fevers, chills or abnormal weight loss Eyes: Denies blurriness of vision Ears, nose, mouth, throat, and face: Denies mucositis or sore throat Respiratory: Denies cough, dyspnea or wheezes Cardiovascular: Denies palpitation, chest discomfort or lower extremity swelling Gastrointestinal:  Denies nausea, heartburn or change in bowel habits Skin: Denies abnormal skin rashes Lymphatics: Denies new lymphadenopathy or easy bruising Neurological:Denies numbness, tingling or new weaknesses Behavioral/Psych: Mood is stable, no new changes  All other systems were reviewed with the  Rose [name] and are negative.  MEDICAL HISTORY:  Past Medical History:  Diagnosis Date  . Asthmatic bronchitis   . Brain cancer ([place])   . Breast cancer ([place])   . Chronic bronchitis ([place])   . Family history of adverse reaction to anesthesia    "my son passed away I think related to the anesthesia"  . Hyperlipidemia   . Hypertension   . Seizures ([place]) 05/10/2015   "it may have been related to me taking Cipro" (05/11/2015)  . Sinus headache     SURGICAL HISTORY: Past Surgical History:  Procedure Laterality Date  . ABDOMINAL HYSTERECTOMY    . APPENDECTOMY    . BREAST BIOPSY Right   . BREAST LUMPECTOMY Right   . CATARACT EXTRACTION Right 08/26/2015  . LAPAROSCOPIC CHOLECYSTECTOMY    . VIDEO BRONCHOSCOPY WITH ENDOBRONCHIAL NAVIGATION N/A 05/14/2015   Procedure: VIDEO BRONCHOSCOPY WITH ENDOBRONCHIAL NAVIGATION;  Surgeon: Collene Gobble, MD;  Location: [place];  Service: Thoracic;  Laterality: N/A;  . VIDEO BRONCHOSCOPY WITH ENDOBRONCHIAL ULTRASOUND N/A 05/14/2015   Procedure: VIDEO BRONCHOSCOPY WITH ENDOBRONCHIAL ULTRASOUND;  Surgeon: Collene Gobble, MD;  Location: [address];  Service: Thoracic;  Laterality: N/A;    ALLERGIES:  has No Known Allergies.  MEDICATIONS:  Current Outpatient Prescriptions  Medication Sig Dispense Refill  . aspirin EC 81 MG tablet Take 81 mg by mouth daily.    Marland Kitchen atenolol (TENORMIN) 50 MG tablet Take 50 mg by mouth daily. Reported on 08/12/2015    . cholecalciferol (VITAMIN D) 1000 units tablet Take 1 tablet (1,000 Units total) by mouth daily. 180 tablet 6  . lactobacillus acidophilus (BACID) TABS tablet Take 1 tablet by mouth daily.     Marland Kitchen levETIRAcetam (KEPPRA) 100 MG/ML solution Take 5 mLs (500 mg total) by mouth 2 (two) times daily. 473 mL 11  . LORazepam (ATIVAN) 0.5 MG tablet Take 1 tablet (0.5 mg total) by mouth every 6 (six) hours as needed (Nausea or vomiting). 30 tablet 0  . mirtazapine (REMERON) 15 MG tablet Take 1 tablet (15 mg total) by mouth at bedtime. 30  tablet 2  . ondansetron (ZOFRAN) 8 MG tablet Take 1 tablet (8 mg total) by mouth every 8 (eight) hours as needed for nausea or vomiting. 30 tablet 1  . pravastatin (PRAVACHOL) 20 MG tablet Take 20 mg by mouth daily. Reported on 08/12/2015    . prednisoLONE acetate (PRED FORTE) 1 % ophthalmic suspension INSTILL 1 DROP INTO SURGICAL EYE QID AFTER SURGERY  1  . prochlorperazine (COMPAZINE) 10 MG tablet Take 1 tablet (10 mg total) by mouth every 6 (six) hours as needed (Nausea or vomiting). 30 tablet 1   No current facility-administered medications for this visit.    Facility-Administered Medications Ordered in Other Visits  Medication Dose Route Frequency Provider Last Rate Last Dose  . CARBOplatin (PARAPLATIN) 430 mg in sodium chloride 0.9 % 250 mL chemo infusion  430 mg Intravenous Once Truitt Merle, MD      . dexamethasone (DECADRON) 10 mg in sodium  chloride 0.9 % 50 mL IVPB  10 mg Intravenous Once Truitt Merle, MD      . PACLitaxel-protein bound (ABRAXANE) chemo infusion 125 mg  75 mg/m2 (Treatment Plan Recorded) Intravenous Once Truitt Merle, MD      . palonosetron (ALOXI) injection 0.25 mg  0.25 mg Intravenous Once Truitt Merle, MD      . sodium chloride 0.9 % injection 10 mL  10 mL Intracatheter PRN Truitt Merle, MD        PHYSICAL EXAMINATION: ECOG PERFORMANCE STATUS: 1 BP 110/76, HR 107, RR 17, O2sat 100% on RA  GENERAL:alert, no distress and comfortable SKIN: skin color, texture, turgor are normal, no rashes or significant lesions EYES: normal, Conjunctiva are pink and non-injected, sclera clear OROPHARYNX:no exudate, no erythema and lips, buccal mucosa, and tongue normal  NECK: supple, thyroid normal size, non-tender, without nodularity LYMPH:  no palpable lymphadenopathy in the cervical, axillary or inguinal LUNGS: clear to auscultation and percussion with normal breathing effort HEART: regular rate & rhythm and no murmurs and no lower extremity edema ABDOMEN:abdomen soft, non-tender and normal bowel  sounds Musculoskeletal:no cyanosis of digits and no clubbing  NEURO: alert & oriented x 3 with fluent speech, no focal motor/sensory deficits  LABORATORY DATA:  I have reviewed the data as listed CBC Latest Ref Rng & Units 10/16/2015 10/02/2015 09/25/2015  WBC 3.9 - 10.3 10e3/uL 5.6 4.0 4.6  Hemoglobin 11.6 - 15.9 g/dL 9.2(L) 9.4(L) 10.2(L)  Hematocrit 34.8 - 46.6 % 28.4(L) 30.0(L) 32.1(L)  Platelets 145 - 400 10e3/uL 312 357 498(H)     CMP Latest Ref Rng & Units 10/16/2015 10/02/2015 09/25/2015  Glucose 70 - 140 mg/dl 104 100 101  BUN 7.0 - 26.0 mg/dL 9.4 11.0 12.8  Creatinine 0.6 - 1.1 mg/dL 0.6 0.7 0.7  Sodium 136 - 145 mEq/L 135(L) 137 134(L)  Potassium 3.5 - 5.1 mEq/L 4.0 4.4 4.1  Chloride 101 - 111 mmol/L - - -  CO2 22 - 29 mEq/L _0 Calcium 8.4 - 10.4 mg/dL 11.0(H) 11.0(H) 10.8(H)  Total Protein 6.4 - 8.3 g/dL 8.3 8.2 8.2  Total Bilirubin 0.20 - 1.20 mg/dL 0.30 <0.30 0.30  Alkaline Phos 40 - 150 U/L 124 114 111  AST 5 - 34 U/L _1 ALT 0 - 55 U/L _2 PATHOLOGY REPORT Diagnosis 05/14/2015  Lung, biopsy, Right upper lobe - MILDLY INFLAMED LUNG PARENCHYMA. - THERE IS NO EVIDENCE OF MALIGNANCY.  Diagnosis 05/14/2015  FINE NEEDLE ASPIRATION: EBUS, 4R, C (SPECIMEN 3 OF , COLLECTED ON 05/14/15): MALIGNANT CELLS PRESENT, CONSISTENT WITH NON SMALL CELL CARCINOMA. SEE COMMENT. COMMENT: THERE IS INSUFFICIENT MATERIAL PRESENT FOR ADDITIONAL STUDIES.  Diagnosis 06/05/2015 Lung, needle/core biopsy(ies), right upper lobe - POORLY DIFFERENTIATED NON SMALL CELL CARCINOMA WITH ASSOCIATED NECROSIS. - SEE COMMENT. Microscopic Comment In lieu of immunohistochemical stains, tissue will be preserved for additional studies, if requested. Dr. Burr Medico was paged on 06/08/15. (JBK:gt, 06/08/15)  ADDITIONAL INFORMATION: The tumor cells are positive for cyto5/5/2017keratin 5/6, suggesting squamous cell carcinoma. Per clinician request, a block will be sent for PDL-1 testing and results  reported separately. (JBK:gt, 06/09/15)   RADIOGRAPHIC STUDIES: I have personally reviewed the radiological images as listed and agreed with the findings in the report.  PET 06/10/2015 IMPRESSION: 1. Hypermetabolic 2.7 cm peripheral right upper lobe pulmonary nodule, consistent with primary bronchogenic carcinoma. 2. Ipsilateral peribronchial, ipsilateral mediastinal and right level 4 neck nodal metastases. 3. Asymmetric foci of hypermetabolism in the left  inferior frontal lobe and right cerebellar hemisphere, cannot exclude intra-axial brain metastases in these locations in this Rose [name] with known brain metastases. 4. Hypermetabolic thyroid isthmus nodule, for which a significant minority will represent thyroid malignancy. Correlate with thyroid ultrasound and ultrasound-guided fine-needle aspiration as clinically warranted. 5. No hypermetabolic metastatic disease in the abdomen, pelvis or skeleton. 6. Coronary atherosclerosis.  CT chest, abdomen and pelvis w contrast 08/10/2015 IMPRESSION: 1. Interval mixed response to therapy. 2. The primary lesion within the right upper lobe has decreased in size from previous exam. There has been interval increase in size of right supraclavicular and right paratracheal adenopathy. 3. Aortic atherosclerosis and coronary artery calcification.  IMPRESSION: Newly seen 2-3 mm lesions in the right cerebellum, medial left frontal lobe, right frontal lobe and 2 foci in the right parietal lobe. Other small lesions scattered throughout the brain are smaller or Stable.   ASSESSMENT & PLAN:  A 65 year old [name] with past medical history of hypertension, 15 pack year history of smoking, presented with seizure like activity, images studies reviewed multiple (at least 6) brain lesions and a 2.3 cm right upper lobe lung nodule, with hilar and mediastinal adenopathy  1. 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  - her repeated Lung biopsy showed poor differentiated squamous cell carcinoma,  PET scan showed hypermetabolic hilar, mediastinum, and  Right neck level IV nodal metastasis. -She unfortunately developed new brain metastasis, had second SBRT on August 9.. - I reviewed the natural history and incurable nature of her metastatic squamous cell lung cancer,  Her overall prognosis is very poor giving the metastatic disease. - The goal of therapy is palliative and prolong her life. -Her tumor PD-L1<1%, unfortunately first line immunotherapy is not indicated. -She is currently on first-line chemotherapy with [place] and Abraxane, which was held lately due to her SBRT  -I reviewed her restaging CT scans from 08/10/2015, which showed mixed response. Her primary right lung lesion has improved, mediastinal adenopathy is stable, right supraclavicular node slightly increased. Overall stable disease.  -She tolerated cycle 4 well, lab reviewed, adequate for treatment, her performance status has improved some, which change carboplatin back to AUC 5  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 She probably has a component of iron deficient anemia also -she received iv feraheme in early July, responded well -she tolerated blood transfusion well, feels better overall    3. HTN, COPD -she will continue follow up with her PCP   4. Peripheral neuropathy, G1 -Secondary to chemotherapy, mild  -We'll continue monitoring  5. Depression -She has developed depression symptoms lately -I recommend her to start mirtazapine 15 mg at bedtime, for depression, anorexia and insomnia. She started on 09/20/15  6. Hypercalcemia -she  has developed hypercalcemia since her cancer diagnosis, likely related to her malignancy -I told her to stop vitamin D, and drink more water -Her corrected calcium is around 12, we'll give her 1 dose of Zometa next week -she has a broken teeth for 6 months, no symptoms, I encourage her to have dental appointment soon   Plan -Lab reviewed, adequate for treatment, will proceed C5D1 carboplatin AUC 5 and Abraxane 75 mg/m today   -I'll see her  back in 2 weeks  before cycle 5 day 15 treatment. -plan to repeat CT after this cycle chemo, will order on her next visit    All questions were answered. The Rose [name] knows to call the clinic with any problems, questions or concerns.    Truitt Merle, MD 10/16/15

## 2015-10-20 ENCOUNTER — Emergency Department (HOSPITAL_COMMUNITY)
Admission: EM | Admit: 2015-10-20 | Discharge: 2015-10-20 | Disposition: A | Discharge status: Home / Self Care | Payer: BLUE CROSS/BLUE SHIELD | Attending: Emergency Medicine | Admitting: Emergency Medicine

## 2015-10-20 ENCOUNTER — Other Ambulatory Visit: Payer: Self-pay

## 2015-10-20 ENCOUNTER — Encounter (HOSPITAL_COMMUNITY): Payer: Self-pay | Admitting: Emergency Medicine

## 2015-10-20 ENCOUNTER — Emergency Department (HOSPITAL_COMMUNITY): Payer: BLUE CROSS/BLUE SHIELD

## 2015-10-20 DIAGNOSIS — J449 Chronic obstructive pulmonary disease, unspecified: Secondary | ICD-10-CM | POA: Insufficient documentation

## 2015-10-20 DIAGNOSIS — R0600 Dyspnea, unspecified: Secondary | ICD-10-CM | POA: Diagnosis not present

## 2015-10-20 DIAGNOSIS — Z7982 Long term (current) use of aspirin: Secondary | ICD-10-CM | POA: Diagnosis not present

## 2015-10-20 DIAGNOSIS — Z79899 Other long term (current) drug therapy: Secondary | ICD-10-CM | POA: Insufficient documentation

## 2015-10-20 DIAGNOSIS — Z85841 Personal history of malignant neoplasm of brain: Secondary | ICD-10-CM | POA: Diagnosis not present

## 2015-10-20 DIAGNOSIS — Z853 Personal history of malignant neoplasm of breast: Secondary | ICD-10-CM | POA: Insufficient documentation

## 2015-10-20 DIAGNOSIS — R0602 Shortness of breath: Secondary | ICD-10-CM | POA: Diagnosis present

## 2015-10-20 DIAGNOSIS — Z85118 Personal history of other malignant neoplasm of bronchus and lung: Secondary | ICD-10-CM | POA: Insufficient documentation

## 2015-10-20 DIAGNOSIS — Z87891 Personal history of nicotine dependence: Secondary | ICD-10-CM | POA: Diagnosis not present

## 2015-10-20 DIAGNOSIS — I10 Essential (primary) hypertension: Secondary | ICD-10-CM | POA: Insufficient documentation

## 2015-10-20 LAB — I-STAT CHEM 8, ED
BUN: 10 mg/dL (ref 6–20)
CHLORIDE: 99 mmol/L — AB (ref 101–111)
Calcium, Ion: 1.28 mmol/L (ref 1.15–1.40)
Creatinine, Ser: 0.5 mg/dL (ref 0.44–1.00)
Glucose, Bld: 102 mg/dL — ABNORMAL HIGH (ref 65–99)
HCT: 32 % — ABNORMAL LOW (ref 36.0–46.0)
Hemoglobin: 10.9 g/dL — ABNORMAL LOW (ref 12.0–15.0)
POTASSIUM: 3.9 mmol/L (ref 3.5–5.1)
SODIUM: 135 mmol/L (ref 135–145)
TCO2: 26 mmol/L (ref 0–100)

## 2015-10-20 LAB — I-STAT TROPONIN, ED: TROPONIN I, POC: 0 ng/mL (ref 0.00–0.08)

## 2015-10-20 LAB — CBC
HCT: 31.2 % — ABNORMAL LOW (ref 36.0–46.0)
HEMOGLOBIN: 9.8 g/dL — AB (ref 12.0–15.0)
MCH: 27.6 pg (ref 26.0–34.0)
MCHC: 31.4 g/dL (ref 30.0–36.0)
MCV: 87.9 fL (ref 78.0–100.0)
PLATELETS: 444 10*3/uL — AB (ref 150–400)
RBC: 3.55 MIL/uL — AB (ref 3.87–5.11)
RDW: 16.6 % — ABNORMAL HIGH (ref 11.5–15.5)
WBC: 5.4 10*3/uL (ref 4.0–10.5)

## 2015-10-20 MED ORDER — ALBUTEROL SULFATE HFA 108 (90 BASE) MCG/ACT IN AERS
2.0000 | INHALATION_SPRAY | RESPIRATORY_TRACT | Status: DC
  Administered 2015-10-20: 2 via RESPIRATORY_TRACT
  Filled 2015-10-20: qty 6.7

## 2015-10-20 MED ORDER — IOPAMIDOL (ISOVUE-370) INJECTION 76%
100.0000 mL | Freq: Once | INTRAVENOUS | Status: AC | PRN
  Administered 2015-10-20: 100 mL via INTRAVENOUS

## 2015-10-20 NOTE — ED Notes (Signed)
Patient transported to X-ray 

## 2015-10-20 NOTE — ED Notes (Signed)
Patient transported to CT 

## 2015-10-20 NOTE — ED Notes (Signed)
Bed: WA07 Expected date:  Expected time:  Means of arrival:  Comments: EMS lung cancer

## 2015-10-20 NOTE — ED Triage Notes (Addendum)
Per EMS, pt from home c/o SOB x2 hours. Hx stage 4 lung cancer. Last chemo 9/16. Denies chest pain.

## 2015-10-20 NOTE — ED Provider Notes (Signed)
[facility] DEPT Provider Note   CSN: [id number] Arrival date & time: 10/20/15  1314     History   Chief Complaint Chief Complaint  Patient presents with  . Shortness of Breath    HPI Rose [name] is a 65 y.o. female.  Patient has a history of lung cancer and presents complaining of shortness of breath over the past several hours.  She reports no chest pain this time.  Her cough is nonproductive.  Denies fevers and chills.  She is currently undergoing chemotherapy.   The history is provided by the patient and medical records.    Past Medical History:  Diagnosis Date  . Asthmatic bronchitis   . Brain cancer ([place])   . Breast cancer ([place])   . Chronic bronchitis ([place])   . Family history of adverse reaction to anesthesia    "my son passed away I think related to the anesthesia"  . Hyperlipidemia   . Hypertension   . Seizures ([place]) 05/10/2015   "it may have been related to me taking Cipro" (05/11/2015)  . Sinus headache     Patient Active Problem List   Diagnosis Date Noted  . Hypercalcemia 10/16/2015  . Port catheter in place 10/02/2015  . Depression 09/25/2015  . Anemia, iron deficiency 06/26/2015  . 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 Primary cancer of right lung metastatic to other site ([place]) 06/07/2015  . Mediastinal adenopathy 05/14/2015  . Elective surgery   . Malnutrition of moderate degree 05/12/2015  . Pyrexia   . Lung nodule   . Aphasia 05/11/2015  . Abnormal CT scan, head 05/11/2015  . Pulmonary nodule, right 05/11/2015  . HYPERLIPIDEMIA 04/17/2008  . TOBACCO ABUSE 02/12/2008  . Personal history of malignant neoplasm of breast 02/12/2008  . GERD 07/06/2007  . Essential hypertension 03/04/2007  . ALLERGIC RHINITIS 03/04/2007  . COPD 03/04/2007    Past Surgical History:  Procedure Laterality Date  . ABDOMINAL HYSTERECTOMY    .  APPENDECTOMY    . BREAST BIOPSY Right   . BREAST LUMPECTOMY Right   . CATARACT EXTRACTION Right 08/26/2015  . LAPAROSCOPIC CHOLECYSTECTOMY    . VIDEO BRONCHOSCOPY WITH ENDOBRONCHIAL NAVIGATION N/A 05/14/2015   Procedure: VIDEO BRONCHOSCOPY WITH ENDOBRONCHIAL NAVIGATION;  Surgeon: Collene Gobble, MD;  Location: [facility];  Service: Thoracic;  Laterality: N/A;  . VIDEO BRONCHOSCOPY WITH ENDOBRONCHIAL ULTRASOUND N/A 05/14/2015   Procedure: VIDEO BRONCHOSCOPY WITH ENDOBRONCHIAL ULTRASOUND;  Surgeon: Collene Gobble, MD;  Location: [facility];  Service: Thoracic;  Laterality: N/A;    OB History    No data available       Home Medications    Prior to Admission medications   Medication Sig Start Date End Date Taking? Authorizing Provider  aspirin EC 81 MG tablet Take 81 mg by mouth daily.   Yes Historical Provider, MD  diphenhydramine-acetaminophen (TYLENOL PM) 25-500 MG TABS tablet Take 2 tablets by mouth at bedtime as needed (sleep/pain).   Yes Historical Provider, MD  ferrous sulfate 325 (65 FE) MG tablet Take 325 mg by mouth 2 (two) times daily.   Yes Historical Provider, MD  lactobacillus acidophilus (BACID) TABS tablet Take 1 tablet by mouth daily.    Yes Historical Provider, MD  levETIRAcetam (KEPPRA) 100 MG/ML solution Take 5 mLs (500 mg total) by mouth 2 (two) times daily. 06/22/15  Yes Marcial Pacas, MD  LORazepam (ATIVAN) 0.5 MG tablet Take 1  tablet (0.5 mg total) by mouth every 6 (six) hours as needed (Nausea or vomiting). 06/19/15  Yes Truitt Merle, MD  mirtazapine (REMERON) 15 MG tablet Take 1 tablet (15 mg total) by mouth at bedtime. Patient taking differently: Take 15 mg by mouth at bedtime as needed (sleep).  09/18/15  Yes Truitt Merle, MD  ondansetron (ZOFRAN) 8 MG tablet Take 1 tablet (8 mg total) by mouth every 8 (eight) hours as needed for nausea or vomiting. 09/18/15  Yes Truitt Merle, MD  prochlorperazine (COMPAZINE) 10 MG tablet Take 1 tablet (10 mg total) by mouth every 6 (six) hours as needed  (Nausea or vomiting). 09/18/15  Yes Truitt Merle, MD  Tetrahydroz-Dextran-PEG-Povid (ADVANCED FORMULA EYE DROPS OP) Apply 1 drop to eye daily.   Yes Historical Provider, MD  cholecalciferol (VITAMIN D) 1000 units tablet Take 1 tablet (1,000 Units total) by mouth daily. 06/17/15   Marcial Pacas, MD    Family History Family History  Problem Relation Age of Onset  . Heart failure Father   . Breast cancer Mother 42  . Gastric cancer Daughter 97  . Diabetes Sister   . Hypertension Sister     Social History Social History  Substance Use Topics  . Smoking status: Former Smoker    Packs/day: 0.50    Years: 36.00    Types: Cigarettes  . Smokeless tobacco: Never Used     Comment: quit 2 weeks ago 05/13/15  . Alcohol use No     Allergies   Review of patient's allergies indicates no known allergies.   Review of Systems Review of Systems  All other systems reviewed and are negative.    Physical Exam Updated Vital Signs BP (!) 91/40 (BP Location: Left Arm)   Pulse 107   Temp 98 F (36.7 C) (Oral)   Resp 20   Ht '5\' 1"'$  (1.549 m)   Wt 115 lb (52.2 kg)   SpO2 98%   BMI 21.73 kg/m   Physical Exam  Constitutional: She is oriented to person, place, and time. She appears well-developed and well-nourished. No distress.  HENT:  Head: Normocephalic and atraumatic.  Eyes: EOM are normal.  Neck: Normal range of motion.  Cardiovascular: Normal rate, regular rhythm and normal heart sounds.   Pulmonary/Chest: Effort normal and breath sounds normal.  Abdominal: Soft. She exhibits no distension. There is no tenderness.  Musculoskeletal: Normal range of motion. She exhibits no edema.  Neurological: She is alert and oriented to person, place, and time.  Skin: Skin is warm and dry.  Psychiatric: She has a normal mood and affect. Judgment normal.  Nursing note and vitals reviewed.    ED Treatments / Results  Labs (all labs ordered are listed, but only abnormal results are displayed) Labs  Reviewed  CBC - Abnormal; Notable for the following:       Result Value   RBC 3.55 (*)    Hemoglobin 9.8 (*)    HCT 31.2 (*)    RDW 16.6 (*)    Platelets 444 (*)    All other components within normal limits  I-STAT CHEM 8, ED - Abnormal; Notable for the following:    Chloride 99 (*)    Glucose, Bld 102 (*)    Hemoglobin 10.9 (*)    HCT 32.0 (*)    All other components within normal limits  CBC  I-STAT TROPOININ, ED    EKG  EKG Interpretation  Date/Time:  Tuesday October 20 2015 14:52:13 EDT Ventricular Rate:  109  PR Interval:    QRS Duration: 83 QT Interval:  335 QTC Calculation: 452 R Axis:   100 Text Interpretation:  Sinus tachycardia Probable left atrial enlargement Right axis deviation RSR' in V1 or V2, probably normal variant No significant change was found Confirmed by Wonder Donaway  MD, Lennette Bihari ([id number]) on 10/20/2015 2:57:08 PM Also confirmed by Venora Maples  MD, Lennette Bihari ([id number]), editor Lorenda Cahill CT, Leda Gauze [phone])  on 10/20/2015 2:59:18 PM       Radiology Dg Chest 2 View  Result Date: 10/20/2015 CLINICAL DATA:  Shortness of breath EXAM: CHEST  2 VIEW COMPARISON:  Chest CT 08/10/2015 FINDINGS: Normal heart size. Known right peritracheal adenopathy with stable convexity. Known right upper lobe nodule. There is no edema, consolidation, effusion, or pneumothorax. No acute osseous finding. Postoperative right chest and axilla. IMPRESSION: 1. No acute finding. 2. Known adenopathy and right upper lobe nodule. Electronically Signed   By: Monte Fantasia M.D.   On: 10/20/2015 14:03   Ct Angio Chest Pe W And/or Wo Contrast  Result Date: 10/20/2015 CLINICAL DATA:  Shortness of breath.  Lung carcinoma. EXAM: CT ANGIOGRAPHY CHEST WITH CONTRAST TECHNIQUE: Multidetector CT imaging of the chest was performed using the standard protocol during bolus administration of intravenous contrast. Multiplanar CT image reconstructions and MIPs were obtained to evaluate the vascular anatomy. CONTRAST:  100 mL Isovue  370 nonionic COMPARISON:  Chest CT August 10, 2015 ; chest radiograph October 20, 2015 FINDINGS: Cardiovascular: There is no demonstrable pulmonary embolus. There is no thoracic aortic aneurysm or dissection. The visualized great vessels appear unremarkable. There is atherosclerotic calcification in the aortic arch region. There are multiple foci of coronary artery calcification. The pericardium is not appreciably thickened. There is left ventricular hypertrophy. Mediastinum/Nodes: Thyroid appears unremarkable. There is adenopathy to the right of the thyroid at the cervical -thoracic junction. Adenopathy in this area measures 3.2 x 2.7 cm compared to 3.1 x 2.2 cm lymph node in this area on most recent prior study. There is extensive adenopathy in the right pretracheal region with the largest lymph node in this area measuring 2.5 x 2.7 cm, essentially stable anterior to the carina, there is adenopathy measuring 2.9 x 2.7 cm, marginally increased from prior study. There is an enlarged right hilar lymph node measuring 1.5 x 1.2 cm. There is a the sub- carinal lymph node measuring 1.7 x 1.4 cm, stable. Lungs/Pleura: There remains a mass in the right upper lobe peripherally in the posterior segment near the junction with the apical segment measuring 2.0 x 1.7 cm, stable. There is no new parenchymal lung mass. There are several 3-4 mm nodular opacities more inferiorly in the anterior segment of the right upper lobe. There is no airspace consolidation. Upper Abdomen: In the visualized upper abdomen, there is evidence of prior left nephrectomy. There is a 1 cm cyst in the periphery of the right kidney. No adrenal lesions are evident. Visualized liver appears unremarkable. Musculoskeletal: There is degenerative change in the thoracic spine. There are no blastic or lytic bone lesions. Review of the MIP images confirms the above findings. IMPRESSION: No demonstrable pulmonary embolus. Stable right upper lobe mass. Multiple  foci of adenopathy with overall marginal increase in adenopathy compared to most recent prior study. No airspace consolidation. There is atherosclerotic calcification in the aorta as well as foci of coronary artery calcification. No thoracic aortic aneurysm or dissection evident. Electronically Signed   By: Lowella Grip III M.D.   On: 10/20/2015 16:02    Procedures Procedures (  including critical care time)  Medications Ordered in ED Medications  albuterol (PROVENTIL HFA;VENTOLIN HFA) 108 (90 Base) MCG/ACT inhaler 2 puff (not administered)  iopamidol (ISOVUE-370) 76 % injection 100 mL (100 mLs Intravenous Contrast Given 10/20/15 1544)     Initial Impression / Assessment and Plan / ED Course  I have reviewed the triage vital signs and the nursing notes.  Pertinent labs & imaging results that were available during my care of the patient were reviewed by me and considered in my medical decision making (see chart for details).  Clinical Course    Patient is overall well-appearing.  No hypoxia.  Chest and CT chest without acute abnormality.  Patient may benefit from echocardiogram in the near future.  I have sent a message to her oncologist through our [facility] record in regards to the patient's visit today and the need for more expedited follow-up in the possibility of an outpatient echocardiogram.  Patient understands to return to the ER for new or worsening symptoms.  No indication for additional workup at this time.  No indication for hospitalization.  Final Clinical Impressions(s) / ED Diagnoses   Final diagnoses:  Dyspnea    New Prescriptions New Prescriptions   No medications on file     Jola Schmidt, MD 10/26/15 [phone]

## 2015-10-23 ENCOUNTER — Ambulatory Visit (HOSPITAL_BASED_OUTPATIENT_CLINIC_OR_DEPARTMENT_OTHER): Payer: BLUE CROSS/BLUE SHIELD

## 2015-10-23 ENCOUNTER — Other Ambulatory Visit (HOSPITAL_BASED_OUTPATIENT_CLINIC_OR_DEPARTMENT_OTHER): Payer: BLUE CROSS/BLUE SHIELD

## 2015-10-23 VITALS — BP 124/64 | HR 107 | Temp 98.7°F | Resp 17

## 2015-10-23 DIAGNOSIS — C3411 Malignant neoplasm of upper lobe, right bronchus or lung: Secondary | ICD-10-CM | POA: Diagnosis not present

## 2015-10-23 DIAGNOSIS — D630 Anemia in neoplastic disease: Secondary | ICD-10-CM

## 2015-10-23 DIAGNOSIS — C778 Secondary and unspecified malignant neoplasm of lymph nodes of multiple regions: Secondary | ICD-10-CM | POA: Diagnosis not present

## 2015-10-23 DIAGNOSIS — D509 Iron deficiency anemia, unspecified: Secondary | ICD-10-CM

## 2015-10-23 DIAGNOSIS — Z5111 Encounter for antineoplastic chemotherapy: Secondary | ICD-10-CM

## 2015-10-23 DIAGNOSIS — C7931 Secondary malignant neoplasm of brain: Secondary | ICD-10-CM | POA: Diagnosis not present

## 2015-10-23 DIAGNOSIS — C3491 Malignant neoplasm of unspecified part of right bronchus or lung: Secondary | ICD-10-CM

## 2015-10-23 DIAGNOSIS — Z95828 Presence of other vascular implants and grafts: Secondary | ICD-10-CM

## 2015-10-23 LAB — CBC WITH DIFFERENTIAL/PLATELET
BASO%: 0.2 % (ref 0.0–2.0)
Basophils Absolute: 0 10*3/uL (ref 0.0–0.1)
EOS ABS: 0 10*3/uL (ref 0.0–0.5)
EOS%: 0.2 % (ref 0.0–7.0)
HCT: 28.5 % — ABNORMAL LOW (ref 34.8–46.6)
HGB: 9 g/dL — ABNORMAL LOW (ref 11.6–15.9)
LYMPH%: 15.3 % (ref 14.0–49.7)
MCH: 27.4 pg (ref 25.1–34.0)
MCHC: 31.6 g/dL (ref 31.5–36.0)
MCV: 86.9 fL (ref 79.5–101.0)
MONO#: 0.7 10*3/uL (ref 0.1–0.9)
MONO%: 14.5 % — AB (ref 0.0–14.0)
NEUT%: 69.8 % (ref 38.4–76.8)
NEUTROS ABS: 3.5 10*3/uL (ref 1.5–6.5)
PLATELETS: 474 10*3/uL — AB (ref 145–400)
RBC: 3.28 10*6/uL — AB (ref 3.70–5.45)
RDW: 16.3 % — ABNORMAL HIGH (ref 11.2–14.5)
WBC: 5 10*3/uL (ref 3.9–10.3)
lymph#: 0.8 10*3/uL — ABNORMAL LOW (ref 0.9–3.3)

## 2015-10-23 LAB — COMPREHENSIVE METABOLIC PANEL
ALT: 11 U/L (ref 0–55)
ANION GAP: 14 meq/L — AB (ref 3–11)
AST: 12 U/L (ref 5–34)
Albumin: 2.8 g/dL — ABNORMAL LOW (ref 3.5–5.0)
Alkaline Phosphatase: 132 U/L (ref 40–150)
BUN: 7.9 mg/dL (ref 7.0–26.0)
CHLORIDE: 100 meq/L (ref 98–109)
CO2: 24 meq/L (ref 22–29)
Calcium: 11.3 mg/dL — ABNORMAL HIGH (ref 8.4–10.4)
Creatinine: 0.6 mg/dL (ref 0.6–1.1)
GLUCOSE: 103 mg/dL (ref 70–140)
Potassium: 3.8 mEq/L (ref 3.5–5.1)
SODIUM: 137 meq/L (ref 136–145)
TOTAL PROTEIN: 8.6 g/dL — AB (ref 6.4–8.3)
Total Bilirubin: 0.39 mg/dL (ref 0.20–1.20)

## 2015-10-23 MED ORDER — ZOLEDRONIC ACID 4 MG/100ML IV SOLN
4.0000 mg | Freq: Once | INTRAVENOUS | Status: AC
Start: 2015-10-23 — End: 2015-10-23
  Administered 2015-10-23: 4 mg via INTRAVENOUS
  Filled 2015-10-23: qty 100

## 2015-10-23 MED ORDER — PROCHLORPERAZINE MALEATE 10 MG PO TABS
10.0000 mg | ORAL_TABLET | Freq: Once | ORAL | Status: AC
  Administered 2015-10-23: 10 mg via ORAL

## 2015-10-23 MED ORDER — PACLITAXEL PROTEIN-BOUND CHEMO INJECTION 100 MG
75.0000 mg/m2 | Freq: Once | INTRAVENOUS | Status: AC
  Administered 2015-10-23: 125 mg via INTRAVENOUS
  Filled 2015-10-23: qty 25

## 2015-10-23 MED ORDER — PROCHLORPERAZINE MALEATE 10 MG PO TABS
ORAL_TABLET | ORAL | Status: AC
  Filled 2015-10-23: qty 1

## 2015-10-23 MED ORDER — SODIUM CHLORIDE 0.9 % IV SOLN
Freq: Once | INTRAVENOUS | Status: AC
Start: 2015-10-23 — End: 2015-10-23
  Administered 2015-10-23: 11:00:00 via INTRAVENOUS

## 2015-10-23 NOTE — Addendum Note (Signed)
Addended by: Truitt Merle on: 10/23/2015 10:48 AM   Modules accepted: Orders

## 2015-10-23 NOTE — Patient Instructions (Addendum)
[facility] Discharge Instructions for Patients Receiving Chemotherapy  Today you received the following chemotherapy agents Abraxane.   To help prevent nausea and vomiting after your treatment, we encourage you to take your nausea medication as directed.  If you develop nausea and vomiting that is not controlled by your nausea medication, call the clinic.   BELOW ARE SYMPTOMS THAT SHOULD BE REPORTED IMMEDIATELY:  *FEVER GREATER THAN 100.5 F  *CHILLS WITH OR WITHOUT FEVER  NAUSEA AND VOMITING THAT IS NOT CONTROLLED WITH YOUR NAUSEA MEDICATION  *UNUSUAL SHORTNESS OF BREATH  *UNUSUAL BRUISING OR BLEEDING  TENDERNESS IN MOUTH AND THROAT WITH OR WITHOUT PRESENCE OF ULCERS  *URINARY PROBLEMS  *BOWEL PROBLEMS  UNUSUAL RASH Items with * indicate a potential emergency and should be followed up as soon as possible.  Feel free to call the clinic you have any questions or concerns. The clinic phone number is [phone].  Please show the [facility] at check-in to the Emergency Department and triage nurse.   Zoledronic Acid injection (Hypercalcemia, Oncology) What is this medicine? ZOLEDRONIC ACID (ZOE le dron ik AS id) lowers the amount of calcium loss from bone. It is used to treat too much calcium in your blood from cancer. It is also used to prevent complications of cancer that has spread to the bone. This medicine may be used for other purposes; ask your health care provider or pharmacist if you have questions. What should I tell my health care provider before I take this medicine? They need to know if you have any of these conditions: -aspirin-sensitive asthma -cancer, especially if you are receiving medicines used to treat cancer -dental disease or wear dentures -infection -kidney disease -receiving corticosteroids like dexamethasone or prednisone -an unusual or allergic reaction to zoledronic acid, other medicines, foods, dyes, or  preservatives -pregnant or trying to get pregnant -breast-feeding How should I use this medicine? This medicine is for infusion into a vein. It is given by a health care professional in a hospital or clinic setting. Talk to your pediatrician regarding the use of this medicine in children. Special care may be needed. Overdosage: If you think you have taken too much of this medicine contact a poison control center or emergency room at once. NOTE: This medicine is only for you. Do not share this medicine with others. What if I miss a dose? It is important not to miss your dose. Call your doctor or health care professional if you are unable to keep an appointment. What may interact with this medicine? -certain antibiotics given by injection -NSAIDs, medicines for pain and inflammation, like ibuprofen or naproxen -some diuretics like bumetanide, furosemide -teriparatide -thalidomide This list may not describe all possible interactions. Give your health care provider a list of all the medicines, herbs, non-prescription drugs, or dietary supplements you use. Also tell them if you smoke, drink alcohol, or use illegal drugs. Some items may interact with your medicine. What should I watch for while using this medicine? Visit your doctor or health care professional for regular checkups. It may be some time before you see the benefit from this medicine. Do not stop taking your medicine unless your doctor tells you to. Your doctor may order blood tests or other tests to see how you are doing. Women should inform their doctor if they wish to become pregnant or think they might be pregnant. There is a potential for serious side effects to an unborn child. Talk to your health care professional  or pharmacist for more information. You should make sure that you get enough calcium and vitamin D while you are taking this medicine. Discuss the foods you eat and the vitamins you take with your health care  professional. Some people who take this medicine have severe bone, joint, and/or muscle pain. This medicine may also increase your risk for jaw problems or a broken thigh bone. Tell your doctor right away if you have severe pain in your jaw, bones, joints, or muscles. Tell your doctor if you have any pain that does not go away or that gets worse. Tell your dentist and dental surgeon that you are taking this medicine. You should not have major dental surgery while on this medicine. See your dentist to have a dental exam and fix any dental problems before starting this medicine. Take good care of your teeth while on this medicine. Make sure you see your dentist for regular follow-up appointments. What side effects may I notice from receiving this medicine? Side effects that you should report to your doctor or health care professional as soon as possible: -allergic reactions like skin rash, itching or hives, swelling of the face, lips, or tongue -anxiety, confusion, or depression -breathing problems -changes in vision -eye pain -feeling faint or lightheaded, falls -jaw pain, especially after dental work -mouth sores -muscle cramps, stiffness, or weakness -redness, blistering, peeling or loosening of the skin, including inside the mouth -trouble passing urine or change in the amount of urine Side effects that usually do not require medical attention (report to your doctor or health care professional if they continue or are bothersome): -bone, joint, or muscle pain -constipation -diarrhea -fever -hair loss -irritation at site where injected -loss of appetite -nausea, vomiting -stomach upset -trouble sleeping -trouble swallowing -weak or tired This list may not describe all possible side effects. Call your doctor for medical advice about side effects. You may report side effects to FDA at 1-800-FDA-1088. Where should I keep my medicine? This drug is given in a hospital or clinic and will not  be stored at home. NOTE: This sheet is a summary. It may not cover all possible information. If you have questions about this medicine, talk to your doctor, pharmacist, or health care provider.    2016, [employer]. (2013-06-15 14:19:39)

## 2015-10-24 LAB — PTH, INTACT AND CALCIUM
Calcium, Ser: 10.7 mg/dL — ABNORMAL HIGH (ref 8.7–10.3)
PTH: 28 pg/mL (ref 15–65)

## 2015-10-30 ENCOUNTER — Ambulatory Visit (HOSPITAL_BASED_OUTPATIENT_CLINIC_OR_DEPARTMENT_OTHER): Payer: BLUE CROSS/BLUE SHIELD | Admitting: Hematology

## 2015-10-30 ENCOUNTER — Ambulatory Visit (HOSPITAL_BASED_OUTPATIENT_CLINIC_OR_DEPARTMENT_OTHER): Payer: BLUE CROSS/BLUE SHIELD

## 2015-10-30 ENCOUNTER — Telehealth: Payer: Self-pay | Admitting: Hematology

## 2015-10-30 ENCOUNTER — Other Ambulatory Visit (HOSPITAL_BASED_OUTPATIENT_CLINIC_OR_DEPARTMENT_OTHER): Payer: BLUE CROSS/BLUE SHIELD

## 2015-10-30 VITALS — BP 126/51 | HR 102 | Temp 98.4°F | Resp 18 | Ht 61.0 in | Wt 112.7 lb

## 2015-10-30 DIAGNOSIS — C778 Secondary and unspecified malignant neoplasm of lymph nodes of multiple regions: Secondary | ICD-10-CM | POA: Diagnosis not present

## 2015-10-30 DIAGNOSIS — C7931 Secondary malignant neoplasm of brain: Secondary | ICD-10-CM

## 2015-10-30 DIAGNOSIS — Z5111 Encounter for antineoplastic chemotherapy: Secondary | ICD-10-CM | POA: Diagnosis not present

## 2015-10-30 DIAGNOSIS — C3491 Malignant neoplasm of unspecified part of right bronchus or lung: Secondary | ICD-10-CM

## 2015-10-30 DIAGNOSIS — D630 Anemia in neoplastic disease: Secondary | ICD-10-CM

## 2015-10-30 DIAGNOSIS — D509 Iron deficiency anemia, unspecified: Secondary | ICD-10-CM

## 2015-10-30 DIAGNOSIS — C3411 Malignant neoplasm of upper lobe, right bronchus or lung: Secondary | ICD-10-CM

## 2015-10-30 DIAGNOSIS — J449 Chronic obstructive pulmonary disease, unspecified: Secondary | ICD-10-CM

## 2015-10-30 DIAGNOSIS — Z23 Encounter for immunization: Secondary | ICD-10-CM | POA: Diagnosis not present

## 2015-10-30 DIAGNOSIS — F32A Depression, unspecified: Secondary | ICD-10-CM

## 2015-10-30 DIAGNOSIS — G620 Drug-induced polyneuropathy: Secondary | ICD-10-CM

## 2015-10-30 DIAGNOSIS — F329 Major depressive disorder, single episode, unspecified: Secondary | ICD-10-CM

## 2015-10-30 DIAGNOSIS — I10 Essential (primary) hypertension: Secondary | ICD-10-CM

## 2015-10-30 LAB — COMPREHENSIVE METABOLIC PANEL
ALBUMIN: 2.6 g/dL — AB (ref 3.5–5.0)
ALK PHOS: 125 U/L (ref 40–150)
ALT: 11 U/L (ref 0–55)
ANION GAP: 13 meq/L — AB (ref 3–11)
AST: 12 U/L (ref 5–34)
BILIRUBIN TOTAL: 0.31 mg/dL (ref 0.20–1.20)
BUN: 8.4 mg/dL (ref 7.0–26.0)
CALCIUM: 10 mg/dL (ref 8.4–10.4)
CO2: 21 mEq/L — ABNORMAL LOW (ref 22–29)
Chloride: 102 mEq/L (ref 98–109)
Creatinine: 0.6 mg/dL (ref 0.6–1.1)
Glucose: 98 mg/dl (ref 70–140)
POTASSIUM: 4.1 meq/L (ref 3.5–5.1)
Sodium: 136 mEq/L (ref 136–145)
Total Protein: 8.1 g/dL (ref 6.4–8.3)

## 2015-10-30 LAB — CBC WITH DIFFERENTIAL/PLATELET
BASO%: 0.7 % (ref 0.0–2.0)
Basophils Absolute: 0 10*3/uL (ref 0.0–0.1)
EOS%: 0.3 % (ref 0.0–7.0)
Eosinophils Absolute: 0 10*3/uL (ref 0.0–0.5)
HEMATOCRIT: 27.9 % — AB (ref 34.8–46.6)
HEMOGLOBIN: 8.8 g/dL — AB (ref 11.6–15.9)
LYMPH#: 1.1 10*3/uL (ref 0.9–3.3)
LYMPH%: 36.2 % (ref 14.0–49.7)
MCH: 27.2 pg (ref 25.1–34.0)
MCHC: 31.5 g/dL (ref 31.5–36.0)
MCV: 86.4 fL (ref 79.5–101.0)
MONO#: 0.7 10*3/uL (ref 0.1–0.9)
MONO%: 22.1 % — AB (ref 0.0–14.0)
NEUT#: 1.2 10*3/uL — ABNORMAL LOW (ref 1.5–6.5)
NEUT%: 40.7 % (ref 38.4–76.8)
PLATELETS: 346 10*3/uL (ref 145–400)
RBC: 3.23 10*6/uL — ABNORMAL LOW (ref 3.70–5.45)
RDW: 16.5 % — ABNORMAL HIGH (ref 11.2–14.5)
WBC: 3 10*3/uL — ABNORMAL LOW (ref 3.9–10.3)

## 2015-10-30 MED ORDER — PROCHLORPERAZINE MALEATE 10 MG PO TABS
ORAL_TABLET | ORAL | Status: AC
  Filled 2015-10-30: qty 1

## 2015-10-30 MED ORDER — PROCHLORPERAZINE MALEATE 10 MG PO TABS
10.0000 mg | ORAL_TABLET | Freq: Once | ORAL | Status: AC
  Administered 2015-10-30: 10 mg via ORAL

## 2015-10-30 MED ORDER — SODIUM CHLORIDE 0.9 % IV SOLN
Freq: Once | INTRAVENOUS | Status: AC
  Administered 2015-10-30: 11:00:00 via INTRAVENOUS

## 2015-10-30 MED ORDER — PACLITAXEL PROTEIN-BOUND CHEMO INJECTION 100 MG
75.0000 mg/m2 | Freq: Once | INTRAVENOUS | Status: AC
  Administered 2015-10-30: 125 mg via INTRAVENOUS
  Filled 2015-10-30: qty 25

## 2015-10-30 MED ORDER — ALBUTEROL SULFATE HFA 108 (90 BASE) MCG/ACT IN AERS: 2.0000 | INHALATION_SPRAY | Freq: Four times a day (QID) | RESPIRATORY_TRACT | 2 refills | Status: DC | PRN

## 2015-10-30 MED ORDER — INFLUENZA VAC SPLIT QUAD 0.5 ML IM SUSY
0.5000 mL | PREFILLED_SYRINGE | Freq: Once | INTRAMUSCULAR | Status: AC
  Administered 2015-10-30: 0.5 mL via INTRAMUSCULAR
  Filled 2015-10-30: qty 0.5

## 2015-10-30 NOTE — Patient Instructions (Addendum)
[facility] Discharge Instructions for Patients Receiving Chemotherapy  Today you received the following chemotherapy agents: Abraxane   To help prevent nausea and vomiting after your treatment, we encourage you to take your nausea medication as directed.    If you develop nausea and vomiting that is not controlled by your nausea medication, call the clinic.   BELOW ARE SYMPTOMS THAT SHOULD BE REPORTED IMMEDIATELY:  *FEVER GREATER THAN 100.5 F  *CHILLS WITH OR WITHOUT FEVER  NAUSEA AND VOMITING THAT IS NOT CONTROLLED WITH YOUR NAUSEA MEDICATION  *UNUSUAL SHORTNESS OF BREATH  *UNUSUAL BRUISING OR BLEEDING  TENDERNESS IN MOUTH AND THROAT WITH OR WITHOUT PRESENCE OF ULCERS  *URINARY PROBLEMS  *BOWEL PROBLEMS  UNUSUAL RASH Items with * indicate a potential emergency and should be followed up as soon as possible.  Feel free to call the clinic you have any questions or concerns. The clinic phone number is [phone].  Please show the CHEMO ALERT CARD at check-in to the Emergency Department and triage nurse.   

## 2015-10-30 NOTE — Progress Notes (Signed)
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 Local edema without significant mass effect.      05/11/2015 Imaging    CT chest, abdomen and pelvis with contrast showed right upper lobe nodule 1.7 cm, with right hilar, bilateral mediastinal, and the thoracic inlet adenopathy.      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 The biopsy or over the right upper lobe lung nodule was negative.      05/22/2015 - 05/27/2015 Radiation Therapy    SRS to brain metastases       06/05/2015 Miscellaneous    PD-L1 IHC <1%       06/05/2015 Pathology Results    Right upper lobe lung nodule core needle biopsy showed poorly differentiated non-small cell lung cancer with associated necrosis, tumor cells are positive for cytokeratin 5/6, suggesting squamous cell carcinoma.      06/05/2015 Procedure    IR RUL lung nodule biopsy       06/19/2015 -  Chemotherapy    Carboplatin AUC 5 on D1,  and abraxane 176m/m2 on Day 1, 8 and 15, every 28 days       09/09/2015 -  Radiation Therapy    Pt received SBRT to her new brain mets       History of present illness  (05/12/2015)  Patient is a 65year old female with past medical history of hypertension, 30 year smoking history, remote history of breast cancer in 1992, presented with sudden onset episodes of difficulty speaking, drooling, and facial twitching on 09/09/2015. It lasted about 7-8 minutes. EMS was called and she was brought to the hospital. Brain scan showed multiple brain lesions, highly suspicious for metastatic disease. CT chest abdomen and pelvis revealed a 2.3 cm right upper lobe lung nodule, along was hilar and mediastinal adenopathy. Pulmonary was consulted for biopsy. I saw her in the hospital, along with 2 of her granddaughters. She feels well now, denies any significant pain, dyspnea, or other symptoms. She does have mild chronic cough nonproductive. She states that her appetite has been low and she lost about 10-15 pounds in the past year. 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 She has 2 sisters and graduations in the area.  CURRENT THERAPY: Carboplatin AUC 5 on day 1, Abraxane 100 mg/m on day 1, 8 and 15, every 28 days, started on 06/19/2015, changed to carbo AUC 5 and abraxane 715mm2 since cycle 3 due to cytopenia, cycle 4 was postponed due to brain radiation   INTERVAL HISTORY:  Mrs. SmKarimeturns for follow-up and chemo. She tolerated last cycle chemo well overall. She developed worsening dyspnea  and cough and went to ED on 10/20/2015. CT chest was negative for PE, she was given albuterol inhaler. Patient contributes her symptoms to allergy. Her symptom has much improved. She returns today for follow-up and chemotherapy. She still has moderate fatigue, able to function well at home. No pain, nausea, neuropathy or other complaints.  REVIEW OF SYSTEMS:   Constitutional: Denies fevers, chills or abnormal weight loss Eyes: Denies blurriness of vision Ears, nose, mouth, throat, and face: Denies mucositis or sore throat Respiratory: Denies cough, dyspnea or  wheezes Cardiovascular: Denies palpitation, chest discomfort or lower extremity swelling Gastrointestinal:  Denies nausea, heartburn or change in bowel habits Skin: Denies abnormal skin rashes Lymphatics: Denies new lymphadenopathy or easy bruising Neurological:Denies numbness, tingling or new weaknesses Behavioral/Psych: Mood is stable, no new changes  All other systems were reviewed with the patient and are negative.  MEDICAL HISTORY:  Past Medical History:  Diagnosis Date  . Asthmatic bronchitis   . Brain cancer ([place])   . Breast cancer ([place])   . Chronic bronchitis ([place])   . Family history of adverse reaction to anesthesia    "my son passed away I think related to the anesthesia"  . Hyperlipidemia   . Hypertension   . Seizures ([place]) 05/10/2015   "it may have been related to me taking Cipro" (05/11/2015)  . Sinus headache     SURGICAL HISTORY: Past Surgical History:  Procedure Laterality Date  . ABDOMINAL HYSTERECTOMY    . APPENDECTOMY    . BREAST BIOPSY Right   . BREAST LUMPECTOMY Right   . CATARACT EXTRACTION Right 08/26/2015  . LAPAROSCOPIC CHOLECYSTECTOMY    . VIDEO BRONCHOSCOPY WITH ENDOBRONCHIAL NAVIGATION N/A 05/14/2015   Procedure: VIDEO BRONCHOSCOPY WITH ENDOBRONCHIAL NAVIGATION;  Surgeon: Collene Gobble, MD;  Location: [place];  Service: Thoracic;  Laterality: N/A;  . VIDEO BRONCHOSCOPY WITH ENDOBRONCHIAL ULTRASOUND N/A 05/14/2015   Procedure: VIDEO BRONCHOSCOPY WITH ENDOBRONCHIAL ULTRASOUND;  Surgeon: Collene Gobble, MD;  Location: [place];  Service: Thoracic;  Laterality: N/A;    ALLERGIES:  has No Known Allergies.  MEDICATIONS:  Current Outpatient Prescriptions  Medication Sig Dispense Refill  . aspirin EC 81 MG tablet Take 81 mg by mouth daily.    . cholecalciferol (VITAMIN D) 1000 units tablet Take 1 tablet (1,000 Units total) by mouth daily. 180 tablet 6  . diphenhydramine-acetaminophen (TYLENOL PM) 25-500 MG TABS tablet Take 2 tablets by mouth at bedtime as  needed (sleep/pain).    . ferrous sulfate 325 (65 FE) MG tablet Take 325 mg by mouth 2 (two) times daily.    Marland Kitchen lactobacillus acidophilus (BACID) TABS tablet Take 1 tablet by mouth daily.     Marland Kitchen levETIRAcetam (KEPPRA) 100 MG/ML solution Take 5 mLs (500 mg total) by mouth 2 (two) times daily. 473 mL 11  . LORazepam (ATIVAN) 0.5 MG tablet Take 1 tablet (0.5 mg total) by mouth every 6 (six) hours as needed (Nausea or vomiting). 30 tablet 0  . mirtazapine (REMERON) 15 MG tablet Take 1 tablet (15 mg total) by mouth at bedtime. (Patient taking differently: Take 15 mg by mouth at bedtime as needed (sleep). ) 30 tablet 2  . ondansetron (ZOFRAN) 8 MG tablet Take 1 tablet (8 mg total) by mouth every 8 (eight) hours as needed for nausea or vomiting. 30 tablet 1  . prochlorperazine (COMPAZINE) 10 MG tablet Take 1 tablet (10 mg total) by mouth every 6 (six) hours as needed (Nausea or vomiting). 30 tablet 1  . Tetrahydroz-Dextran-PEG-Povid (  ADVANCED FORMULA EYE DROPS OP) Apply 1 drop to eye daily.     No current facility-administered medications for this visit.    Facility-Administered Medications Ordered in Other Visits  Medication Dose Route Frequency Provider Last Rate Last Dose  . sodium chloride 0.9 % injection 10 mL  10 mL Intracatheter PRN Truitt Merle, MD        PHYSICAL EXAMINATION: ECOG PERFORMANCE STATUS: 1 BP 110/76, HR 107, RR 17, O2sat 100% on RA  GENERAL:alert, no distress and comfortable SKIN: skin color, texture, turgor are normal, no rashes or significant lesions EYES: normal, Conjunctiva are pink and non-injected, sclera clear OROPHARYNX:no exudate, no erythema and lips, buccal mucosa, and tongue normal  NECK: supple, thyroid normal size, non-tender, without nodularity LYMPH:  no palpable lymphadenopathy in the cervical, axillary or inguinal LUNGS: clear to auscultation and percussion with normal breathing effort HEART: regular rate & rhythm and no murmurs and no lower extremity  edema ABDOMEN:abdomen soft, non-tender and normal bowel sounds Musculoskeletal:no cyanosis of digits and no clubbing  NEURO: alert & oriented x 3 with fluent speech, no focal motor/sensory deficits  LABORATORY DATA:  I have reviewed the data as listed CBC Latest Ref Rng & Units 10/23/2015 10/20/2015 10/20/2015  WBC 3.9 - 10.3 10e3/uL 5.0 - 5.4  Hemoglobin 11.6 - 15.9 g/dL 9.0(L) 10.9(L) 9.8(L)  Hematocrit 34.8 - 46.6 % 28.5(L) 32.0(L) 31.2(L)  Platelets 145 - 400 10e3/uL 474(H) - 444(H)     CMP Latest Ref Rng & Units 10/23/2015 10/23/2015 10/20/2015  Glucose 70 - 140 mg/dl 103 - 102(H)  BUN 7.0 - 26.0 mg/dL 7.9 - 10  Creatinine 0.6 - 1.1 mg/dL 0.6 - 0.50  Sodium 136 - 145 mEq/L 137 - 135  Potassium 3.5 - 5.1 mEq/L 3.8 - 3.9  Chloride 101 - 111 mmol/L - - 99(L)  CO2 22 - 29 mEq/L 24 - -  Calcium 8.7 - 10.3 mg/dL 10.7(H) 11.3(H) -  Total Protein 6.4 - 8.3 g/dL 8.6(H) - -  Total Bilirubin 0.20 - 1.20 mg/dL 0.39 - -  Alkaline Phos 40 - 150 U/L 132 - -  AST 5 - 34 U/L 12 - -  ALT 0 - 55 U/L 11 - -   PATHOLOGY REPORT Diagnosis 05/14/2015  Lung, biopsy, Right upper lobe - MILDLY INFLAMED LUNG PARENCHYMA. - THERE IS NO EVIDENCE OF MALIGNANCY.  Diagnosis 05/14/2015  FINE NEEDLE ASPIRATION: EBUS, 4R, C (SPECIMEN 3 OF , COLLECTED ON 05/14/15): MALIGNANT CELLS PRESENT, CONSISTENT WITH NON SMALL CELL CARCINOMA. SEE COMMENT. COMMENT: THERE IS INSUFFICIENT MATERIAL PRESENT FOR ADDITIONAL STUDIES.  Diagnosis 06/05/2015 Lung, needle/core biopsy(ies), right upper lobe - POORLY DIFFERENTIATED NON SMALL CELL CARCINOMA WITH ASSOCIATED NECROSIS. - SEE COMMENT. Microscopic Comment In lieu of immunohistochemical stains, tissue will be preserved for additional studies, if requested. Dr. Burr Medico was paged on 06/08/15. (JBK:gt, 06/08/15)  ADDITIONAL INFORMATION: The tumor cells are positive for cyto5/5/2017keratin 5/6, suggesting squamous cell carcinoma. Per clinician request, a block will be sent for PDL-1  testing and results reported separately. (JBK:gt, 06/09/15)   RADIOGRAPHIC STUDIES: I have personally reviewed the radiological images as listed and agreed with the findings in the report.  PET 06/10/2015 IMPRESSION: 1. Hypermetabolic 2.7 cm peripheral right upper lobe pulmonary nodule, consistent with primary bronchogenic carcinoma. 2. Ipsilateral peribronchial, ipsilateral mediastinal and right level 4 neck nodal metastases. 3. Asymmetric foci of hypermetabolism in the left inferior frontal lobe and right cerebellar hemisphere, cannot exclude intra-axial brain metastases in these locations in this patient with known  brain metastases. 4. Hypermetabolic thyroid isthmus nodule, for which a significant minority will represent thyroid malignancy. Correlate with thyroid ultrasound and ultrasound-guided fine-needle aspiration as clinically warranted. 5. No hypermetabolic metastatic disease in the abdomen, pelvis or skeleton. 6. Coronary atherosclerosis.  CT chest w contrast 10/20/2015 IMPRESSION: No demonstrable pulmonary embolus.  Stable right upper lobe mass. Multiple foci of adenopathy with overall marginal increase in adenopathy compared to most recent prior study. No airspace consolidation.  There is atherosclerotic calcification in the aorta as well as foci of coronary artery calcification. No thoracic aortic aneurysm or dissection evident. .   ASSESSMENT & PLAN:  A 65 year old female with past medical history of hypertension, 15 pack year history of smoking, presented with seizure like activity, images studies reviewed multiple (at least 6) brain lesions and a 2.3 cm right upper lobe lung nodule, with hilar and mediastinal adenopathy  1. 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  - her repeated Lung biopsy showed poor  differentiated squamous cell carcinoma,  PET scan showed hypermetabolic hilar, mediastinum, and  Right neck level IV nodal metastasis. -She unfortunately developed new brain metastasis, had second SBRT on August 9.. - I reviewed the natural history and incurable nature of her metastatic squamous cell lung cancer,  Her overall prognosis is very poor giving the metastatic disease. - The goal of therapy is palliative and prolong her life. -Her tumor PD-L1<1%, unfortunately first line immunotherapy is not indicated. -She is currently on first-line chemotherapy with [place] and Abraxane, which was held lately due to her SBRT  -I reviewed her CT chest from 10/20/2015, which was obtained during her EC visit, showed stable right upper lobe mass, marginal increase in adenopathy, no other new lesions. Overall stable disease. -Lab reviewed, adequate for treatment, we'll proceed cycle 5 day 15 Abraxane today. -Plan to complete cycle 6 carboplatin and Abraxane. -Consider to switch her to immunotherapy as maintenance/second line therapy after she completes chemotherapy.  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 She probably has a component of iron deficient anemia also -she received iv feraheme in early July, responded well -she has required blood transfusion. Hemoglobin slightly dropping, 8.8 today, no need blood transfusion.  3. HTN, COPD -she will continue follow up with her PCP  -I given her a refill of her albuterol inhaler  4. Peripheral neuropathy, G1 -Secondary to chemotherapy, mild  -We'll continue monitoring  5. Depression -She has developed depression symptoms lately -I recommend her to start mirtazapine 15 mg at bedtime, for depression, anorexia and insomnia. She started on 09/20/15, with some improvement, will  continue   6. Hypercalcemia -she has developed hypercalcemia since her cancer diagnosis, likely related to her malignancy -I told her to stop vitamin D, and drink more water -Her corrected calcium was around 12, she received Zometa, calcium has improved, 10 today -she has a broken teeth for 6 months, no symptoms, I encourage her to have dental appointment soon   Plan -Lab reviewed, adequate for treatment, will proceed C5D15 Abraxane 75 mg/m today   -I'll see her  back in 2 weeks before cycle 6 day 1 treatment. -plan to repeat CT abdomen and pelvis after cycle 6     All questions were answered. The  patient knows to call the clinic with any problems, questions or concerns.    Truitt Merle, MD 10/30/15

## 2015-10-30 NOTE — Telephone Encounter (Signed)
Message sent to chemo scheduler to be added. Avs report and appointment schedule given to patient, per 10/30/15 los.

## 2015-10-30 NOTE — Progress Notes (Signed)
Okay to treat today with neutrophils of 1.2 per Dr. Burr Medico.

## 2015-10-31 ENCOUNTER — Encounter: Payer: Self-pay | Admitting: Hematology

## 2015-11-02 ENCOUNTER — Telehealth: Payer: Self-pay | Admitting: *Deleted

## 2015-11-02 NOTE — Telephone Encounter (Signed)
Per LOS I have scheduled appts and notified the scheduler 

## 2015-11-03 ENCOUNTER — Encounter: Payer: Self-pay | Admitting: Medical Oncology

## 2015-11-03 ENCOUNTER — Encounter: Payer: Self-pay | Admitting: *Deleted

## 2015-11-06 ENCOUNTER — Telehealth: Payer: Self-pay | Admitting: Nutrition

## 2015-11-06 NOTE — Telephone Encounter (Signed)
Patient was identified to be at risk for malnutrition on the MST secondary to poor appetite and weight loss. Contacted patient by phone who reports her appetite is poor and food has a bad taste. She has been drinking Carnation breakfast essentials. Educated patient on strategies for increasing calories and protein and encouraged small frequent meals and snacks with high-calorie, high-protein foods. Encouraged patient to try boost or ensure between meals and assured her that these were lactose-free. I will mail patient information and coupons along with my contact information.  I encouraged patient to call me with further questions or if she desires an appointment.  Patient was appreciative.

## 2015-11-13 ENCOUNTER — Other Ambulatory Visit (HOSPITAL_BASED_OUTPATIENT_CLINIC_OR_DEPARTMENT_OTHER): Payer: Medicare Other

## 2015-11-13 ENCOUNTER — Ambulatory Visit: Payer: BLUE CROSS/BLUE SHIELD | Admitting: Nurse Practitioner

## 2015-11-13 ENCOUNTER — Encounter: Payer: Self-pay | Admitting: Hematology

## 2015-11-13 ENCOUNTER — Ambulatory Visit: Payer: BLUE CROSS/BLUE SHIELD

## 2015-11-13 ENCOUNTER — Telehealth: Payer: Self-pay | Admitting: Hematology

## 2015-11-13 ENCOUNTER — Ambulatory Visit (HOSPITAL_BASED_OUTPATIENT_CLINIC_OR_DEPARTMENT_OTHER): Payer: Medicare Other | Admitting: Hematology

## 2015-11-13 VITALS — BP 129/62 | HR 102 | Temp 98.8°F | Resp 17 | Ht 61.0 in | Wt 109.1 lb

## 2015-11-13 DIAGNOSIS — C3411 Malignant neoplasm of upper lobe, right bronchus or lung: Secondary | ICD-10-CM | POA: Diagnosis present

## 2015-11-13 DIAGNOSIS — D509 Iron deficiency anemia, unspecified: Secondary | ICD-10-CM

## 2015-11-13 DIAGNOSIS — G620 Drug-induced polyneuropathy: Secondary | ICD-10-CM | POA: Diagnosis not present

## 2015-11-13 DIAGNOSIS — F32A Depression, unspecified: Secondary | ICD-10-CM

## 2015-11-13 DIAGNOSIS — C7931 Secondary malignant neoplasm of brain: Secondary | ICD-10-CM

## 2015-11-13 DIAGNOSIS — C778 Secondary and unspecified malignant neoplasm of lymph nodes of multiple regions: Secondary | ICD-10-CM | POA: Diagnosis not present

## 2015-11-13 DIAGNOSIS — G4700 Insomnia, unspecified: Secondary | ICD-10-CM | POA: Diagnosis not present

## 2015-11-13 DIAGNOSIS — M542 Cervicalgia: Secondary | ICD-10-CM | POA: Diagnosis not present

## 2015-11-13 DIAGNOSIS — R1013 Epigastric pain: Secondary | ICD-10-CM

## 2015-11-13 DIAGNOSIS — F329 Major depressive disorder, single episode, unspecified: Secondary | ICD-10-CM | POA: Diagnosis not present

## 2015-11-13 DIAGNOSIS — I10 Essential (primary) hypertension: Secondary | ICD-10-CM | POA: Diagnosis not present

## 2015-11-13 DIAGNOSIS — J449 Chronic obstructive pulmonary disease, unspecified: Secondary | ICD-10-CM | POA: Diagnosis not present

## 2015-11-13 DIAGNOSIS — D630 Anemia in neoplastic disease: Secondary | ICD-10-CM

## 2015-11-13 DIAGNOSIS — C3491 Malignant neoplasm of unspecified part of right bronchus or lung: Secondary | ICD-10-CM

## 2015-11-13 LAB — COMPREHENSIVE METABOLIC PANEL
ALT: <6 U/L (ref 0–55)
ANION GAP: 13 meq/L — AB (ref 3–11)
AST: 8 U/L (ref 5–34)
Albumin: 2.7 g/dL — ABNORMAL LOW (ref 3.5–5.0)
Alkaline Phosphatase: 139 U/L (ref 40–150)
BUN: 6.3 mg/dL — ABNORMAL LOW (ref 7.0–26.0)
CALCIUM: 10.2 mg/dL (ref 8.4–10.4)
CHLORIDE: 101 meq/L (ref 98–109)
CO2: 23 mEq/L (ref 22–29)
CREATININE: 0.6 mg/dL (ref 0.6–1.1)
Glucose: 99 mg/dl (ref 70–140)
POTASSIUM: 3.6 meq/L (ref 3.5–5.1)
Sodium: 137 mEq/L (ref 136–145)
Total Bilirubin: 0.43 mg/dL (ref 0.20–1.20)
Total Protein: 8.6 g/dL — ABNORMAL HIGH (ref 6.4–8.3)

## 2015-11-13 LAB — CBC WITH DIFFERENTIAL/PLATELET
BASO%: 0.7 % (ref 0.0–2.0)
BASOS ABS: 0 10*3/uL (ref 0.0–0.1)
EOS%: 0.4 % (ref 0.0–7.0)
Eosinophils Absolute: 0 10*3/uL (ref 0.0–0.5)
HEMATOCRIT: 27 % — AB (ref 34.8–46.6)
HGB: 8.6 g/dL — ABNORMAL LOW (ref 11.6–15.9)
LYMPH#: 1.1 10*3/uL (ref 0.9–3.3)
LYMPH%: 22.4 % (ref 14.0–49.7)
MCH: 27 pg (ref 25.1–34.0)
MCHC: 31.8 g/dL (ref 31.5–36.0)
MCV: 85.1 fL (ref 79.5–101.0)
MONO#: 0.5 10*3/uL (ref 0.1–0.9)
MONO%: 9.9 % (ref 0.0–14.0)
NEUT#: 3.1 10*3/uL (ref 1.5–6.5)
NEUT%: 66.6 % (ref 38.4–76.8)
PLATELETS: 245 10*3/uL (ref 145–400)
RBC: 3.17 10*6/uL — ABNORMAL LOW (ref 3.70–5.45)
RDW: 17.7 % — ABNORMAL HIGH (ref 11.2–14.5)
WBC: 4.7 10*3/uL (ref 3.9–10.3)

## 2015-11-13 MED ORDER — ZOLPIDEM TARTRATE 5 MG PO TABS: 5.0000 mg | ORAL_TABLET | Freq: Every evening | ORAL | 0 refills | Status: DC | PRN

## 2015-11-13 MED ORDER — ESOMEPRAZOLE MAGNESIUM 20 MG PO PACK: 20.0000 mg | PACK | Freq: Every day | ORAL | 1 refills | Status: AC

## 2015-11-13 MED ORDER — TRAMADOL HCL 50 MG PO TABS: 50.0000 mg | ORAL_TABLET | Freq: Three times a day (TID) | ORAL | 0 refills | Status: DC | PRN

## 2015-11-13 NOTE — Progress Notes (Signed)
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 Local edema without significant mass effect.      05/11/2015 Imaging    CT chest, abdomen and pelvis with contrast showed right upper lobe nodule 1.7 cm, with right hilar, bilateral mediastinal, and the thoracic inlet adenopathy.      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 The biopsy or over the right upper lobe lung nodule was negative.      05/22/2015 - 05/27/2015 Radiation Therapy    SRS to brain metastases       06/05/2015 Miscellaneous    PD-L1 IHC <1%       06/05/2015 Pathology Results    Right upper lobe lung nodule core needle biopsy showed poorly differentiated non-small cell lung cancer with associated necrosis, tumor cells are positive for cytokeratin 5/6, suggesting squamous cell carcinoma.      06/05/2015 Procedure    IR RUL lung nodule biopsy       06/19/2015 -  Chemotherapy    Carboplatin AUC 5 on D1,  and abraxane 150m/m2 on Day 1, 8 and 15, every 28 days       09/09/2015 -  Radiation Therapy    Pt received SBRT to her new brain mets       History of present illness  (05/12/2015)  Patient is a 65year old female with past medical history of hypertension, 30 year smoking history, remote history of breast cancer in 1992, presented with sudden onset episodes of difficulty speaking, drooling, and facial twitching on 09/09/2015. It lasted about 7-8 minutes. EMS was called and she was brought to the hospital. Brain scan showed multiple brain lesions, highly suspicious for metastatic disease. CT chest abdomen and pelvis revealed a 2.3 cm right upper lobe lung nodule, along was hilar and mediastinal adenopathy. Pulmonary was consulted for biopsy. I saw her in the hospital, along with 2 of her granddaughters. She feels well now, denies any significant pain, dyspnea, or other symptoms. She does have mild chronic cough nonproductive. She states that her appetite has been low and she lost about 10-15 pounds in the past year. 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 She has 2 sisters and graduations in the area.  CURRENT THERAPY: Carboplatin AUC 5 on day 1, Abraxane 100 mg/m on day 1, 8 and 15, every 28 days, started on 06/19/2015, changed to carbo AUC 5 and abraxane 747mm2 since cycle 3 due to cytopenia, cycle 4 was postponed due to brain radiation   INTERVAL HISTORY:  Mrs. SmHolswortheturns for follow-up and chemo. She has been clinically stable, but did develop some right-sided neck  pain and epigastric discomfort since yesterday. She states her neck pain is positional, she has tried Tylenol, but did not help much. She denies any significant headaches or other neurological symptoms. Her epigastric discomfort is mild, no significant nausea, diarrhea or other new symptoms. She overall feels not very well today, no fever, chills, productive cough or other symptoms.  REVIEW OF SYSTEMS:   Constitutional: Denies fevers, chills or abnormal weight loss Eyes: Denies blurriness of vision Ears, nose, mouth, throat, and face: Denies mucositis or sore  throat Respiratory: Denies cough, dyspnea or wheezes Cardiovascular: Denies palpitation, chest discomfort or lower extremity swelling Gastrointestinal:  Denies nausea, heartburn or change in bowel habits Skin: Denies abnormal skin rashes Lymphatics: Denies new lymphadenopathy or easy bruising Neurological:Denies numbness, tingling or new weaknesses Behavioral/Psych: Mood is stable, no new changes  All other systems were reviewed with the patient and are negative.  MEDICAL HISTORY:  Past Medical History:  Diagnosis Date  . Asthmatic bronchitis   . Brain cancer ([place])   . Breast cancer ([place])   . Chronic bronchitis ([place])   . Family history of adverse reaction to anesthesia    "my son passed away I think related to the anesthesia"  . Hyperlipidemia   . Hypertension   . Seizures ([place]) 05/10/2015   "it may have been related to me taking Cipro" (05/11/2015)  . Sinus headache     SURGICAL HISTORY: Past Surgical History:  Procedure Laterality Date  . ABDOMINAL HYSTERECTOMY    . APPENDECTOMY    . BREAST BIOPSY Right   . BREAST LUMPECTOMY Right   . CATARACT EXTRACTION Right 08/26/2015  . LAPAROSCOPIC CHOLECYSTECTOMY    . VIDEO BRONCHOSCOPY WITH ENDOBRONCHIAL NAVIGATION N/A 05/14/2015   Procedure: VIDEO BRONCHOSCOPY WITH ENDOBRONCHIAL NAVIGATION;  Surgeon: Collene Gobble, MD;  Location: [place];  Service: Thoracic;  Laterality: N/A;  . VIDEO BRONCHOSCOPY WITH ENDOBRONCHIAL ULTRASOUND N/A 05/14/2015   Procedure: VIDEO BRONCHOSCOPY WITH ENDOBRONCHIAL ULTRASOUND;  Surgeon: Collene Gobble, MD;  Location: [place];  Service: Thoracic;  Laterality: N/A;    ALLERGIES:  has No Known Allergies.  MEDICATIONS:  Current Outpatient Prescriptions  Medication Sig Dispense Refill  . albuterol (PROVENTIL) (2.5 MG/3ML) 0.083% nebulizer solution Take 2.5 mg by nebulization every 6 (six) hours as needed for wheezing or shortness of breath. Not sure of concentration    . albuterol (VENTOLIN HFA) 108 (90 Base)  MCG/ACT inhaler Inhale 2 puffs into the lungs every 6 (six) hours as needed for wheezing or shortness of breath. 1 Inhaler 2  . aspirin EC 81 MG tablet Take 81 mg by mouth daily.    . cholecalciferol (VITAMIN D) 1000 units tablet Take 1 tablet (1,000 Units total) by mouth daily. 180 tablet 6  . diphenhydramine-acetaminophen (TYLENOL PM) 25-500 MG TABS tablet Take 2 tablets by mouth at bedtime as needed (sleep/pain).    . ferrous sulfate 325 (65 FE) MG tablet Take 325 mg by mouth 2 (two) times daily.    Marland Kitchen lactobacillus acidophilus (BACID) TABS tablet Take 1 tablet by mouth daily.     Marland Kitchen levETIRAcetam (KEPPRA) 100 MG/ML solution Take 5 mLs (500 mg total) by mouth 2 (two) times daily. 473 mL 11  . LORazepam (ATIVAN) 0.5 MG tablet Take 1 tablet (0.5 mg total) by mouth every 6 (six) hours as needed (Nausea or vomiting). 30 tablet 0  . mirtazapine (REMERON) 15 MG tablet Take 1 tablet (15 mg total) by mouth at bedtime. (Patient taking differently: Take 15 mg by mouth  at bedtime as needed (sleep). ) 30 tablet 2  . ondansetron (ZOFRAN) 8 MG tablet Take 1 tablet (8 mg total) by mouth every 8 (eight) hours as needed for nausea or vomiting. 30 tablet 1  . prochlorperazine (COMPAZINE) 10 MG tablet Take 1 tablet (10 mg total) by mouth every 6 (six) hours as needed (Nausea or vomiting). 30 tablet 1  . esomeprazole (NEXIUM) 20 MG packet Take 20 mg by mouth daily before breakfast. 30 each 1  . traMADol (ULTRAM) 50 MG tablet Take 1 tablet (50 mg total) by mouth every 8 (eight) hours as needed. 20 tablet 0  . zolpidem (AMBIEN) 5 MG tablet Take 1 tablet (5 mg total) by mouth at bedtime as needed for sleep. 30 tablet 0   No current facility-administered medications for this visit.    Facility-Administered Medications Ordered in Other Visits  Medication Dose Route Frequency Provider Last Rate Last Dose  . sodium chloride 0.9 % injection 10 mL  10 mL Intracatheter PRN Truitt Merle, MD        PHYSICAL EXAMINATION: ECOG  PERFORMANCE STATUS: 2 BP 129/62 (BP Location: Left Arm, Patient Position: Sitting)   Pulse (!) 102   Temp 98.8 F (37.1 C) (Oral)   Resp 17   Ht 5' 1"  (1.549 m)   Wt 109 lb 1.6 oz (49.5 kg)   SpO2 100%   BMI 20.61 kg/m   GENERAL:alert, no distress and comfortable SKIN: skin color, texture, turgor are normal, no rashes or significant lesions EYES: normal, Conjunctiva are pink and non-injected, sclera clear OROPHARYNX:no exudate, no erythema and lips, buccal mucosa, and tongue normal  NECK: supple, thyroid normal size, non-tender, without nodularity LYMPH:  no palpable lymphadenopathy in the cervical, axillary or inguinal LUNGS: clear to auscultation and percussion with normal breathing effort HEART: regular rate & rhythm and no murmurs and no lower extremity edema ABDOMEN:abdomen soft, non-tender and normal bowel sounds Musculoskeletal:no cyanosis of digits and no clubbing  NEURO: alert & oriented x 3 with fluent speech, no focal motor/sensory deficits  LABORATORY DATA:  I have reviewed the data as listed CBC Latest Ref Rng & Units 11/13/2015 10/30/2015 10/23/2015  WBC 3.9 - 10.3 10e3/uL 4.7 3.0(L) 5.0  Hemoglobin 11.6 - 15.9 g/dL 8.6(L) 8.8(L) 9.0(L)  Hematocrit 34.8 - 46.6 % 27.0(L) 27.9(L) 28.5(L)  Platelets 145 - 400 10e3/uL 245 346 474(H)     CMP Latest Ref Rng & Units 11/13/2015 10/30/2015 10/23/2015  Glucose 70 - 140 mg/dl 99 98 103  BUN 7.0 - 26.0 mg/dL 6.3(L) 8.4 7.9  Creatinine 0.6 - 1.1 mg/dL 0.6 0.6 0.6  Sodium 136 - 145 mEq/L 137 136 137  Potassium 3.5 - 5.1 mEq/L 3.6 4.1 3.8  Chloride 101 - 111 mmol/L - - -  CO2 22 - 29 mEq/L 23 21(L) 24  Calcium 8.4 - 10.4 mg/dL 10.2 10.0 10.7(H)  Total Protein 6.4 - 8.3 g/dL 8.6(H) 8.1 8.6(H)  Total Bilirubin 0.20 - 1.20 mg/dL 0.43 0.31 0.39  Alkaline Phos 40 - 150 U/L 139 125 132  AST 5 - 34 U/L 8 12 12   ALT 0-55 U/L U/L <6 11 11    PATHOLOGY REPORT Diagnosis 05/14/2015  Lung, biopsy, Right upper lobe - MILDLY INFLAMED  LUNG PARENCHYMA. - THERE IS NO EVIDENCE OF MALIGNANCY.  Diagnosis 05/14/2015  FINE NEEDLE ASPIRATION: EBUS, 4R, C (SPECIMEN 3 OF , COLLECTED ON 05/14/15): MALIGNANT CELLS PRESENT, CONSISTENT WITH NON SMALL CELL CARCINOMA. SEE COMMENT. COMMENT: THERE IS INSUFFICIENT MATERIAL PRESENT FOR ADDITIONAL STUDIES.  Diagnosis 06/05/2015 Lung, needle/core biopsy(ies), right upper lobe - POORLY DIFFERENTIATED NON SMALL CELL CARCINOMA WITH ASSOCIATED NECROSIS. - SEE COMMENT. Microscopic Comment In lieu of immunohistochemical stains, tissue will be preserved for additional studies, if requested. Dr. Burr Medico was paged on 06/08/15. (JBK:gt, 06/08/15)  ADDITIONAL INFORMATION: The tumor cells are positive for cyto5/5/2017keratin 5/6, suggesting squamous cell carcinoma. Per clinician request, a block will be sent for PDL-1 testing and results reported separately. (JBK:gt, 06/09/15)   RADIOGRAPHIC STUDIES: I have personally reviewed the radiological images as listed and agreed with the findings in the report.  PET 06/10/2015 IMPRESSION: 1. Hypermetabolic 2.7 cm peripheral right upper lobe pulmonary nodule, consistent with primary bronchogenic carcinoma. 2. Ipsilateral peribronchial, ipsilateral mediastinal and right level 4 neck nodal metastases. 3. Asymmetric foci of hypermetabolism in the left inferior frontal lobe and right cerebellar hemisphere, cannot exclude intra-axial brain metastases in these locations in this patient with known brain metastases. 4. Hypermetabolic thyroid isthmus nodule, for which a significant minority will represent thyroid malignancy. Correlate with thyroid ultrasound and ultrasound-guided fine-needle aspiration as clinically warranted. 5. No hypermetabolic metastatic disease in the abdomen, pelvis or skeleton. 6. Coronary atherosclerosis.  CT chest w contrast 10/20/2015 IMPRESSION: No demonstrable pulmonary embolus.  Stable right upper lobe mass. Multiple foci of  adenopathy with overall marginal increase in adenopathy compared to most recent prior study. No airspace consolidation.  There is atherosclerotic calcification in the aorta as well as foci of coronary artery calcification. No thoracic aortic aneurysm or dissection evident. .   ASSESSMENT & PLAN:  A 65 year old female with past medical history of hypertension, 15 pack year history of smoking, presented with seizure like activity, images studies reviewed multiple (at least 6) brain lesions and a 2.3 cm right upper lobe lung nodule, with hilar and mediastinal adenopathy  1. 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  - her repeated Lung biopsy showed poor differentiated squamous cell carcinoma,  PET scan showed hypermetabolic hilar, mediastinum, and  Right neck level IV nodal metastasis. -She unfortunately developed new brain metastasis, had second SBRT on August 9.. - I reviewed the natural history and incurable nature of her metastatic squamous cell lung cancer,  Her overall prognosis is very poor giving the metastatic disease. - The goal of therapy is palliative and prolong her life. -Her tumor PD-L1<1%, unfortunately first line immunotherapy is not indicated. -She is currently on first-line chemotherapy with [place] and Abraxane, which was held lately due to her SBRT  -I reviewed her CT chest from 10/20/2015, which was obtained during her EC visit, showed stable right upper lobe mass, marginal increase in adenopathy, no other new lesions. Overall stable disease. -Lab reviewed, anemia is stable. Due to her neck pain, epigastric discomfort and not feeling well today, I'll hold chemotherapy today, and postponed her next week. -Plan to repeat her restaging CT scan after the next cycle chemotherapy, and switch her to Nivolumab  2. Anemia in  neoplastic disease, iron deficiency -Iron study showed low serum iron and saturation, normal ferritin, normal TIBC, most consistent with anemia of chronic disease, likely secondary to her underlying malignancy. She probably has a component of iron deficient anemia also -she received iv feraheme in early July, responded well -she has required blood transfusion. Hemoglobin slightly dropping, 8.8 today, no need blood transfusion.  3. HTN, COPD -she will continue follow up with her PCP  -  she uses albuterol as needed  4. Peripheral neuropathy, G1 -Secondary to chemotherapy, mild  -We'll continue monitoring  5. Depression and insomnia  -She has developed depression symptoms lately -I recommend her to start mirtazapine 15 mg at bedtime, for depression, anorexia and insomnia. She started on 09/20/15, with some improvement, will continue  -She has tried Tylenol PM and melatonin for insomnia, did not work. I give her prescription of Ambien today  6. Hypercalcemia -she has developed hypercalcemia since her cancer diagnosis, likely related to her malignancy -I told her to stop vitamin D, and drink more water -Her corrected calcium was around 12, she received Zometa, calcium has improved, 10 today -she has a broken teeth for 6 months, no symptoms, I encourage her to have dental appointment soon   7. Neck pain -Probably muscular pain, I give her a prescription of tramadol  8. Epigastric discomfort -she will try nexium   Plan -will hold chemo today, and postpone to next week, she will see APP before chemo -I'll see her  back in 3 weeks before cycle 6 day 15 treatment. -plan to repeat CT abdomen and pelvis after cycle 6  -I called in tramadol, Ambien and Nexium for her today    All questions were answered. The patient knows to call the clinic with any problems, questions or concerns.    Truitt Merle, MD 11/13/15

## 2015-11-13 NOTE — Telephone Encounter (Signed)
Gave patient avs report and appointments for October. Per LT ok to use 10/20 slot at 11:15 am.

## 2015-11-16 ENCOUNTER — Emergency Department (HOSPITAL_COMMUNITY): Payer: Medicare Other

## 2015-11-16 ENCOUNTER — Emergency Department (HOSPITAL_COMMUNITY)
Admission: EM | Admit: 2015-11-16 | Discharge: 2015-11-16 | Disposition: A | Discharge status: Home / Self Care | Payer: Medicare Other | Attending: Emergency Medicine | Admitting: Emergency Medicine

## 2015-11-16 ENCOUNTER — Other Ambulatory Visit: Payer: Self-pay

## 2015-11-16 ENCOUNTER — Encounter (HOSPITAL_COMMUNITY): Payer: Self-pay | Admitting: Emergency Medicine

## 2015-11-16 DIAGNOSIS — Z853 Personal history of malignant neoplasm of breast: Secondary | ICD-10-CM | POA: Diagnosis not present

## 2015-11-16 DIAGNOSIS — G8929 Other chronic pain: Secondary | ICD-10-CM | POA: Insufficient documentation

## 2015-11-16 DIAGNOSIS — Z85118 Personal history of other malignant neoplasm of bronchus and lung: Secondary | ICD-10-CM | POA: Diagnosis not present

## 2015-11-16 DIAGNOSIS — Z7982 Long term (current) use of aspirin: Secondary | ICD-10-CM | POA: Insufficient documentation

## 2015-11-16 DIAGNOSIS — D649 Anemia, unspecified: Secondary | ICD-10-CM | POA: Insufficient documentation

## 2015-11-16 DIAGNOSIS — M25511 Pain in right shoulder: Secondary | ICD-10-CM | POA: Insufficient documentation

## 2015-11-16 DIAGNOSIS — J449 Chronic obstructive pulmonary disease, unspecified: Secondary | ICD-10-CM | POA: Insufficient documentation

## 2015-11-16 DIAGNOSIS — J45909 Unspecified asthma, uncomplicated: Secondary | ICD-10-CM | POA: Diagnosis not present

## 2015-11-16 DIAGNOSIS — I10 Essential (primary) hypertension: Secondary | ICD-10-CM | POA: Diagnosis not present

## 2015-11-16 DIAGNOSIS — Z87891 Personal history of nicotine dependence: Secondary | ICD-10-CM | POA: Diagnosis not present

## 2015-11-16 DIAGNOSIS — R0602 Shortness of breath: Secondary | ICD-10-CM | POA: Diagnosis present

## 2015-11-16 DIAGNOSIS — J189 Pneumonia, unspecified organism: Secondary | ICD-10-CM | POA: Diagnosis not present

## 2015-11-16 DIAGNOSIS — Z79899 Other long term (current) drug therapy: Secondary | ICD-10-CM | POA: Diagnosis not present

## 2015-11-16 LAB — BASIC METABOLIC PANEL
Anion gap: 13 (ref 5–15)
BUN: 7 mg/dL (ref 6–20)
CALCIUM: 9.5 mg/dL (ref 8.9–10.3)
CHLORIDE: 96 mmol/L — AB (ref 101–111)
CO2: 23 mmol/L (ref 22–32)
CREATININE: 0.44 mg/dL (ref 0.44–1.00)
GFR calc Af Amer: >60 mL/min (ref >60)
GFR calc non Af Amer: >60 mL/min (ref >60)
GLUCOSE: 104 mg/dL — AB (ref 65–99)
Potassium: 3.7 mmol/L (ref 3.5–5.1)
Sodium: 132 mmol/L — ABNORMAL LOW (ref 135–145)

## 2015-11-16 LAB — CBC WITH DIFFERENTIAL/PLATELET
Basophils Absolute: 0 10*3/uL (ref 0.0–0.1)
Basophils Relative: 0 %
EOS ABS: 0 10*3/uL (ref 0.0–0.7)
EOS PCT: 0 %
HCT: 24.9 % — ABNORMAL LOW (ref 36.0–46.0)
Hemoglobin: 7.7 g/dL — ABNORMAL LOW (ref 12.0–15.0)
LYMPHS ABS: 1.3 10*3/uL (ref 0.7–4.0)
LYMPHS PCT: 27 %
MCH: 26.6 pg (ref 26.0–34.0)
MCHC: 30.9 g/dL (ref 30.0–36.0)
MCV: 86.2 fL (ref 78.0–100.0)
MONOS PCT: 8 %
Monocytes Absolute: 0.4 10*3/uL (ref 0.1–1.0)
Neutro Abs: 3.2 10*3/uL (ref 1.7–7.7)
Neutrophils Relative %: 65 %
PLATELETS: 405 10*3/uL — AB (ref 150–400)
RBC: 2.89 MIL/uL — AB (ref 3.87–5.11)
RDW: 16.9 % — ABNORMAL HIGH (ref 11.5–15.5)
WBC: 4.9 10*3/uL (ref 4.0–10.5)

## 2015-11-16 LAB — POC OCCULT BLOOD, ED: FECAL OCCULT BLD: NEGATIVE

## 2015-11-16 LAB — TROPONIN I: Troponin I: <0.03 ng/mL (ref <0.03)

## 2015-11-16 MED ORDER — DOXYCYCLINE HYCLATE 100 MG PO TABS
100.0000 mg | ORAL_TABLET | Freq: Once | ORAL | Status: AC
  Administered 2015-11-16: 100 mg via ORAL
  Filled 2015-11-16: qty 1

## 2015-11-16 MED ORDER — HYDROCODONE-ACETAMINOPHEN 5-325 MG PO TABS: 1.0000 | ORAL_TABLET | Freq: Four times a day (QID) | ORAL | 0 refills | Status: DC | PRN

## 2015-11-16 MED ORDER — HYDROCODONE-ACETAMINOPHEN 5-325 MG PO TABS
1.0000 | ORAL_TABLET | Freq: Once | ORAL | Status: AC
  Administered 2015-11-16: 1 via ORAL
  Filled 2015-11-16: qty 1

## 2015-11-16 MED ORDER — ALBUTEROL SULFATE (2.5 MG/3ML) 0.083% IN NEBU
5.0000 mg | INHALATION_SOLUTION | Freq: Once | RESPIRATORY_TRACT | Status: AC
  Administered 2015-11-16: 5 mg via RESPIRATORY_TRACT
  Filled 2015-11-16: qty 6

## 2015-11-16 MED ORDER — ONDANSETRON 4 MG PO TBDP
4.0000 mg | ORAL_TABLET | Freq: Once | ORAL | Status: AC
  Administered 2015-11-16: 4 mg via ORAL
  Filled 2015-11-16: qty 1

## 2015-11-16 MED ORDER — DOXYCYCLINE HYCLATE 100 MG PO CAPS: 100.0000 mg | ORAL_CAPSULE | Freq: Two times a day (BID) | ORAL | 0 refills | Status: DC

## 2015-11-16 NOTE — ED Triage Notes (Addendum)
Per EMS, Pt c/o R shoulder pain and SOB. Pt has hx of lung cancer. Pt was supposed to have chemo last Friday but her stomach was upset. Supposed to try again this Friday. Pt sts seasonally her SOB gets worse. Pt sts her SOB is the same as it was a month ago when she was seen. Lung sounds clear bilaterally, 100% on room air. Shoulder pain worse with palpation, pain sharp and shooting with movement. Pt has also been seen for shoulder pain multiple times and has been told to get a heating pad. A&Ox4.

## 2015-11-16 NOTE — ED Notes (Signed)
Unable to collect labs patient is getting an EKG done at this time

## 2015-11-16 NOTE — ED Provider Notes (Addendum)
[facility] DEPT Provider Note   CSN: [id number] Arrival date & time: 11/16/15  1541     History   Chief Complaint Chief Complaint  Patient presents with  . Shortness of Breath  . Shoulder Pain    HPI Rose [name] is a 65 y.o. female.  HPI  65 year old female presents with a chief complaint of right shoulder and neck pain. Has been ongoing for the last 3 or 4 weeks. Comes and goes but is mostly constant. Worse today than typical. She has been taking tramadol and a one half tramadol intermittently which helps pain but she doesn't like taking because it causes severe dizziness. Sometimes takes Aleve for ibuprofen which helped for about 2 hours. She has a history of right upper lobe lung cancer with metastatic disease. Is on chemotherapy. Has had a cough for about one week. Was short of breath today that she feels like is from allergies setting off her asthma. Is better after being given an albuterol treatment today and now shortness of breath is gone. She thinks it was set off by being outside extensively yesterday. Denies chest pain. Denies a weakness or numbness in her extremity. Feels dehydrated and wants to eat. Has not eaten today, states it's from a lack of appetite.  Past Medical History:  Diagnosis Date  . Asthmatic bronchitis   . Brain cancer ([place])   . Breast cancer ([place])   . Chronic bronchitis ([place])   . Family history of adverse reaction to anesthesia    "my son passed away I think related to the anesthesia"  . Hyperlipidemia   . Hypertension   . Seizures ([place]) 05/10/2015   "it may have been related to me taking Cipro" (05/11/2015)  . Sinus headache     Patient Active Problem List   Diagnosis Date Noted  . Hypercalcemia 10/16/2015  . Port catheter in place 10/02/2015  . Depression 09/25/2015  . Anemia, iron deficiency 06/26/2015  . 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 Primary cancer of right lung metastatic to other site ([place]) 06/07/2015  . Mediastinal adenopathy 05/14/2015  . Elective surgery   . Malnutrition of moderate degree 05/12/2015  . Pyrexia   . Lung nodule   . Aphasia 05/11/2015  . Abnormal CT scan, head 05/11/2015  . Pulmonary nodule, right 05/11/2015  . HYPERLIPIDEMIA 04/17/2008  . TOBACCO ABUSE 02/12/2008  . Personal history of malignant neoplasm of breast 02/12/2008  . GERD 07/06/2007  . Essential hypertension 03/04/2007  . ALLERGIC RHINITIS 03/04/2007  . COPD 03/04/2007    Past Surgical History:  Procedure Laterality Date  . ABDOMINAL HYSTERECTOMY    . APPENDECTOMY    . BREAST BIOPSY Right   . BREAST LUMPECTOMY Right   . CATARACT EXTRACTION Right 08/26/2015  . LAPAROSCOPIC CHOLECYSTECTOMY    . VIDEO BRONCHOSCOPY WITH ENDOBRONCHIAL NAVIGATION N/A 05/14/2015   Procedure: VIDEO BRONCHOSCOPY WITH ENDOBRONCHIAL NAVIGATION;  Surgeon: Collene Gobble, MD;  Location: [facility];  Service: Thoracic;  Laterality: N/A;  . VIDEO BRONCHOSCOPY WITH ENDOBRONCHIAL ULTRASOUND N/A 05/14/2015   Procedure: VIDEO BRONCHOSCOPY WITH ENDOBRONCHIAL ULTRASOUND;  Surgeon: Collene Gobble, MD;  Location: [facility];  Service: Thoracic;  Laterality: N/A;    OB History    No data available       Home Medications    Prior to Admission medications   Medication Sig Start Date End Date Taking? Authorizing Provider  albuterol (PROVENTIL) (  2.5 MG/3ML) 0.083% nebulizer solution Take 2.5 mg by nebulization every 6 (six) hours as needed for wheezing or shortness of breath. Not sure of concentration   Yes Historical Provider, MD  albuterol (VENTOLIN HFA) 108 (90 Base) MCG/ACT inhaler Inhale 2 puffs into the lungs every 6 (six) hours as needed for wheezing or shortness of breath. 10/30/15  Yes Truitt Merle, MD  aspirin EC 81 MG tablet Take 81 mg by mouth every morning.    Yes Historical Provider, MD  diphenhydramine-acetaminophen (TYLENOL PM) 25-500 MG TABS tablet  Take 2 tablets by mouth at bedtime as needed (sleep/pain).   Yes Historical Provider, MD  ferrous sulfate 325 (65 FE) MG tablet Take 325 mg by mouth 2 (two) times daily.   Yes Historical Provider, MD  lactobacillus acidophilus (BACID) TABS tablet Take 1 tablet by mouth every morning.    Yes Historical Provider, MD  levETIRAcetam (KEPPRA) 100 MG/ML solution Take 5 mLs (500 mg total) by mouth 2 (two) times daily. 06/22/15  Yes Marcial Pacas, MD  LORazepam (ATIVAN) 0.5 MG tablet Take 1 tablet (0.5 mg total) by mouth every 6 (six) hours as needed (Nausea or vomiting). 06/19/15  Yes Truitt Merle, MD  mirtazapine (REMERON) 15 MG tablet Take 1 tablet (15 mg total) by mouth at bedtime. Patient taking differently: Take 15 mg by mouth at bedtime as needed (sleep).  09/18/15  Yes Truitt Merle, MD  ondansetron (ZOFRAN) 8 MG tablet Take 1 tablet (8 mg total) by mouth every 8 (eight) hours as needed for nausea or vomiting. 09/18/15  Yes Truitt Merle, MD  prochlorperazine (COMPAZINE) 10 MG tablet Take 1 tablet (10 mg total) by mouth every 6 (six) hours as needed (Nausea or vomiting). 09/18/15  Yes Truitt Merle, MD  zolpidem (AMBIEN) 5 MG tablet Take 1 tablet (5 mg total) by mouth at bedtime as needed for sleep. 11/13/15  Yes Truitt Merle, MD  cholecalciferol (VITAMIN D) 1000 units tablet Take 1 tablet (1,000 Units total) by mouth daily. Patient not taking: Reported on 11/16/2015 06/17/15   Marcial Pacas, MD  doxycycline (VIBRAMYCIN) 100 MG capsule Take 1 capsule (100 mg total) by mouth 2 (two) times daily. One po bid x 7 days 11/16/15   Sherwood Gambler, MD  esomeprazole (NEXIUM) 20 MG packet Take 20 mg by mouth daily before breakfast. Patient not taking: Reported on 11/16/2015 11/13/15   Truitt Merle, MD  HYDROcodone-acetaminophen (NORCO) 5-325 MG tablet Take 1 tablet by mouth every 6 (six) hours as needed for severe pain. 11/16/15   Sherwood Gambler, MD    Family History Family History  Problem Relation Age of Onset  . Heart failure Father   .  Breast cancer Mother 53  . Gastric cancer Daughter 67  . Diabetes Sister   . Hypertension Sister     Social History Social History  Substance Use Topics  . Smoking status: Former Smoker    Packs/day: 0.50    Years: 36.00    Types: Cigarettes  . Smokeless tobacco: Never Used     Comment: quit 2 weeks ago 05/13/15  . Alcohol use No     Allergies   Review of patient's allergies indicates no known allergies.   Review of Systems Review of Systems  Constitutional: Negative for fever.  Respiratory: Positive for cough and shortness of breath.   Cardiovascular: Negative for chest pain and leg swelling.  Musculoskeletal: Positive for arthralgias (right shoulder).  Neurological: Negative for weakness and numbness.  All other systems reviewed and are  negative.    Physical Exam Updated Vital Signs BP 120/58 (BP Location: Right Arm)   Pulse 98   Temp 98.4 F (36.9 C) (Oral)   Resp 16   SpO2 99%   Physical Exam  Constitutional: She is oriented to person, place, and time. She appears well-developed and well-nourished.  HENT:  Head: Normocephalic and atraumatic.  Right Ear: External ear normal.  Left Ear: External ear normal.  Nose: Nose normal.  Eyes: Right eye exhibits no discharge. Left eye exhibits no discharge.  Cardiovascular: Normal rate, regular rhythm and normal heart sounds.   Pulses:      Radial pulses are 2+ on the right side.  Pulmonary/Chest: Effort normal and breath sounds normal. No tachypnea. She has no wheezes. She has no rales.  Abdominal: Soft. There is no tenderness.  Genitourinary: Rectum normal. Rectal exam shows no external hemorrhoid, no internal hemorrhoid and guaiac negative stool (brown stool).  Musculoskeletal:       Right shoulder: She exhibits tenderness (mild, over trapezius and pain with ROM). She exhibits normal range of motion, no deformity, normal pulse and normal strength.  Normal str and sensation in RUE  Neurological: She is alert and  oriented to person, place, and time.  Skin: Skin is warm and dry.  Nursing note and vitals reviewed.    ED Treatments / Results  Labs (all labs ordered are listed, but only abnormal results are displayed) Labs Reviewed  BASIC METABOLIC PANEL - Abnormal; Notable for the following:       Result Value   Sodium 132 (*)    Chloride 96 (*)    Glucose, Bld 104 (*)    All other components within normal limits  CBC WITH DIFFERENTIAL/PLATELET - Abnormal; Notable for the following:    RBC 2.89 (*)    Hemoglobin 7.7 (*)    HCT 24.9 (*)    RDW 16.9 (*)    Platelets 405 (*)    All other components within normal limits  TROPONIN I  POC OCCULT BLOOD, ED    EKG  EKG Interpretation  Date/Time:  Monday November 16 2015 16:09:28 EDT Ventricular Rate:  97 PR Interval:    QRS Duration: 93 QT Interval:  352 QTC Calculation: 448 R Axis:   69 Text Interpretation:  Sinus rhythm Probable left atrial enlargement S1,S2,S3 pattern RSR' in V1 or V2, right VCD or RVH no significant change since Sept 2017 Confirmed by Regenia Skeeter MD, Nas Wafer [phone]) on 11/16/2015 7:19:19 PM       Radiology Dg Chest 2 View  Result Date: 11/16/2015 CLINICAL DATA:  Dyspnea, dry cough, sternal chest pain and right-sided neck and shoulder pain over 3 weeks. History of breast cancer and lung cancer. EXAM: CHEST  2 VIEW COMPARISON:  Chest radiograph of 10/20/2015 and chest CT 10/20/2015 FINDINGS: New area of pulmonary consolidation suggested adjacent to known right upper lobe mass. Otherwise, the lungs appear unremarkable. Heart is normal in size. Right paratracheal soft tissue prominence consistent with known mediastinal adenopathy. The thoracic aorta is slightly tortuous without aneurysm. Atherosclerosis is noted of the thoracic aortic arch. Axillary clips are seen on the right. IMPRESSION: New area of pulmonary consolidation adjacent to and partially obscuring known right upper lobe mass. Post obstructive pulmonary consolidation  or potentially pneumonia in the right upper lobe might account this appearance. Electronically Signed   By: Ashley Royalty M.D.   On: 11/16/2015 17:47    Procedures Procedures (including critical care time)  Medications Ordered in ED Medications  albuterol (PROVENTIL) (2.5 MG/3ML) 0.083% nebulizer solution 5 mg (5 mg Nebulization Given 11/16/15 1655)  HYDROcodone-acetaminophen (NORCO/VICODIN) 5-325 MG per tablet 1 tablet (1 tablet Oral Given 11/16/15 1809)  doxycycline (VIBRA-TABS) tablet 100 mg (100 mg Oral Given 11/16/15 2045)  ondansetron (ZOFRAN-ODT) disintegrating tablet 4 mg (4 mg Oral Given 11/16/15 2100)     Initial Impression / Assessment and Plan / ED Course  I have reviewed the triage vital signs and the nursing notes.  Pertinent labs & imaging results that were available during my care of the patient were reviewed by me and considered in my medical decision making (see chart for details).  Clinical Course  Comment By Time  Shoulder/neck appears chronic. Transient dyspnea probably COPD, will get CXR and labs. Negative CTA 1 month ago, do not feel repeat needed. Sherwood Gambler, MD 10/16 [phone]  Patient is not dyspneic currently. No vomitign, chest pain, dyspnea. Right shoulder pain is subacute, possibly from mass. Hydrocodone helped. Will give short course of hydrocodone. Doxycycline for possible PNA given 1 week of cough. No fevers, neutropenia or sepsis. F/u with her oncologist in 4 days as previously discussed. Hgb 7.7 which is lower than recently, typical in 8-9 range. No obvious cause. F/u closely. Not symptomatic. Sherwood Gambler, MD 10/16 2012      Final Clinical Impressions(s) / ED Diagnoses   Final diagnoses:  Chronic right shoulder pain  Postobstructive pneumonia  Anemia, unspecified type    New Prescriptions Discharge Medication List as of 11/16/2015  8:17 PM    START taking these medications   Details  doxycycline (VIBRAMYCIN) 100 MG capsule Take 1 capsule  (100 mg total) by mouth 2 (two) times daily. One po bid x 7 days, Starting Mon 11/16/2015, Print    HYDROcodone-acetaminophen (NORCO) 5-325 MG tablet Take 1 tablet by mouth every 6 (six) hours as needed for severe pain., Starting Mon 11/16/2015, Print         Sherwood Gambler, MD 11/16/15 [place], MD 11/16/15 [phone]

## 2015-11-16 NOTE — ED Notes (Signed)
Pt began vomiting at discharge

## 2015-11-16 NOTE — ED Notes (Signed)
MD at bedside. 

## 2015-11-17 ENCOUNTER — Telehealth: Payer: Self-pay | Admitting: *Deleted

## 2015-11-17 NOTE — Telephone Encounter (Signed)
FYI "I went to ED last night.  Informed I have Pneumonia. Doxycycline 100 mg and Hydrocodone-APAP 5-325 mg ordered.  No fever, but pain to right neck and shoulder.  ED instructed me to notify Dr. Burr Medico.  Next scheduled F/U is 11-20-2015 with NP.  I was nauseated and threw up in the ED after taking the antibiotic.  Today I've kept it down.  I have zofran to use at home if needed."

## 2015-11-18 ENCOUNTER — Other Ambulatory Visit: Payer: Self-pay | Admitting: *Deleted

## 2015-11-18 DIAGNOSIS — D6481 Anemia due to antineoplastic chemotherapy: Secondary | ICD-10-CM

## 2015-11-18 DIAGNOSIS — C3491 Malignant neoplasm of unspecified part of right bronchus or lung: Secondary | ICD-10-CM

## 2015-11-18 DIAGNOSIS — T451X5A Adverse effect of antineoplastic and immunosuppressive drugs, initial encounter: Principal | ICD-10-CM

## 2015-11-18 NOTE — Telephone Encounter (Signed)
Patient called and left message at 1537.   She went to the ED on Monday and they put her on an antibiotic, doxycycline '100mg'$ .  She can not stand up and she is asking if she can just stop the antibiotic.  She sees Dr. Burr Medico on Friday.  Her call back is [phone]  Or [phone] Discussed with Dr. Burr Medico.  She can stop the doxycycline tonight and come in to see NP tomorrow.  Called patient and let her know.  She will come at 10am for lab and then 10:45am to see Nicolasa Ducking NP.  Patient is very happy that she can stop the doxycycline.

## 2015-11-18 NOTE — Telephone Encounter (Signed)
Voicemail: "I was started on an antibiotic Monday after ED said I have Pneumonia.  I can stand up and having trouble doing things or going anywhere.  Is there another medicine to take?"  No vomiting but slight nausea.  Advised she continue antibiotic.  Rest to get over this lung infection.  MD will be notified and if any new orders will call.

## 2015-11-19 ENCOUNTER — Encounter: Payer: Self-pay | Admitting: Oncology

## 2015-11-19 ENCOUNTER — Ambulatory Visit (HOSPITAL_BASED_OUTPATIENT_CLINIC_OR_DEPARTMENT_OTHER): Payer: Medicare Other | Admitting: Oncology

## 2015-11-19 ENCOUNTER — Other Ambulatory Visit (HOSPITAL_BASED_OUTPATIENT_CLINIC_OR_DEPARTMENT_OTHER): Payer: Medicare Other

## 2015-11-19 ENCOUNTER — Ambulatory Visit (HOSPITAL_COMMUNITY)
Admission: RE | Admit: 2015-11-19 | Discharge: 2015-11-19 | Disposition: A | Discharge status: Home / Self Care | Payer: Medicare Other | Source: Ambulatory Visit | Attending: Hematology | Admitting: Hematology

## 2015-11-19 VITALS — BP 130/52 | HR 94 | Temp 98.2°F | Resp 16 | Ht 61.0 in | Wt 106.0 lb

## 2015-11-19 DIAGNOSIS — C778 Secondary and unspecified malignant neoplasm of lymph nodes of multiple regions: Secondary | ICD-10-CM

## 2015-11-19 DIAGNOSIS — D6481 Anemia due to antineoplastic chemotherapy: Secondary | ICD-10-CM

## 2015-11-19 DIAGNOSIS — M25511 Pain in right shoulder: Secondary | ICD-10-CM

## 2015-11-19 DIAGNOSIS — D630 Anemia in neoplastic disease: Secondary | ICD-10-CM | POA: Insufficient documentation

## 2015-11-19 DIAGNOSIS — C3491 Malignant neoplasm of unspecified part of right bronchus or lung: Secondary | ICD-10-CM

## 2015-11-19 DIAGNOSIS — C7931 Secondary malignant neoplasm of brain: Secondary | ICD-10-CM

## 2015-11-19 DIAGNOSIS — T451X5A Adverse effect of antineoplastic and immunosuppressive drugs, initial encounter: Secondary | ICD-10-CM

## 2015-11-19 DIAGNOSIS — D649 Anemia, unspecified: Secondary | ICD-10-CM | POA: Diagnosis not present

## 2015-11-19 DIAGNOSIS — C3411 Malignant neoplasm of upper lobe, right bronchus or lung: Secondary | ICD-10-CM

## 2015-11-19 LAB — COMPREHENSIVE METABOLIC PANEL
ALBUMIN: 2.7 g/dL — AB (ref 3.5–5.0)
ALK PHOS: 120 U/L (ref 40–150)
ALT: 7 U/L (ref 0–55)
AST: 10 U/L (ref 5–34)
Anion Gap: 13 mEq/L — ABNORMAL HIGH (ref 3–11)
BUN: 6.3 mg/dL — ABNORMAL LOW (ref 7.0–26.0)
CALCIUM: 10.6 mg/dL — AB (ref 8.4–10.4)
CHLORIDE: 99 meq/L (ref 98–109)
CO2: 25 mEq/L (ref 22–29)
Creatinine: 0.6 mg/dL (ref 0.6–1.1)
GLUCOSE: 109 mg/dL (ref 70–140)
POTASSIUM: 3.7 meq/L (ref 3.5–5.1)
SODIUM: 137 meq/L (ref 136–145)
Total Bilirubin: 0.31 mg/dL (ref 0.20–1.20)
Total Protein: 8.7 g/dL — ABNORMAL HIGH (ref 6.4–8.3)

## 2015-11-19 LAB — CBC WITH DIFFERENTIAL/PLATELET
BASO%: 0.2 % (ref 0.0–2.0)
BASOS ABS: 0 10*3/uL (ref 0.0–0.1)
EOS ABS: 0 10*3/uL (ref 0.0–0.5)
EOS%: 0.2 % (ref 0.0–7.0)
HCT: 25 % — ABNORMAL LOW (ref 34.8–46.6)
HEMOGLOBIN: 7.9 g/dL — AB (ref 11.6–15.9)
LYMPH%: 20.4 % (ref 14.0–49.7)
MCH: 27.1 pg (ref 25.1–34.0)
MCHC: 31.6 g/dL (ref 31.5–36.0)
MCV: 85.6 fL (ref 79.5–101.0)
MONO#: 0.4 10*3/uL (ref 0.1–0.9)
MONO%: 8.8 % (ref 0.0–14.0)
NEUT#: 3.1 10*3/uL (ref 1.5–6.5)
NEUT%: 70.4 % (ref 38.4–76.8)
Platelets: 509 10*3/uL — ABNORMAL HIGH (ref 145–400)
RBC: 2.92 10*6/uL — ABNORMAL LOW (ref 3.70–5.45)
RDW: 17.1 % — AB (ref 11.2–14.5)
WBC: 4.4 10*3/uL (ref 3.9–10.3)
lymph#: 0.9 10*3/uL (ref 0.9–3.3)

## 2015-11-19 LAB — PREPARE RBC (CROSSMATCH)

## 2015-11-19 MED ORDER — SODIUM CHLORIDE 0.9 % IV SOLN
Freq: Once | INTRAVENOUS | Status: AC
  Administered 2015-11-19: 13:00:00 via INTRAVENOUS

## 2015-11-19 MED ORDER — ZOLEDRONIC ACID 4 MG/100ML IV SOLN
4.0000 mg | Freq: Once | INTRAVENOUS | Status: AC
  Administered 2015-11-19: 4 mg via INTRAVENOUS
  Filled 2015-11-19: qty 100

## 2015-11-19 NOTE — Progress Notes (Signed)
3 attempts '@peripheral'$  IV in left forearm w/o success. OK to use right forearm per Dr. Burr Medico for IV fluids and Zometa.   Pt and daughter aware of blood transfusion tomorrow @ 12 noon @ [facility]

## 2015-11-19 NOTE — Progress Notes (Signed)
SYMPTOM MANAGEMENT CLINIC    Chief Complaint: fatigue, dizziness, nausea  HPI:  Rose [name] 65 y.o. female diagnosed with stage IV lung cancer. Currently being treated with Carbo/Abraxane. S/P 5 cycles. Chemo held last week due to nausea and weakness. Patient was seen in the ER earlier this week due to right shoulder pain. The patient was diagnosed with pneumonia and started on Doxycycline. She developed dizziness, nausea, and weakness and attributed this to the antibiotic. She stopped the antibiotic yesterday. Review of ER records shows that the patient also had a Hgb of 7.7 in the ER. The patient today reports cough that is not productive. Denies fevers. Thinks her shortness of breath is better. Reports dizziness and being off balance. Denies headache and visual changes. Has been having nausea and some epigastric discomfort. Did not start Nexium that was ordered at her last visit. Not eating or drinking well. Using Hydrocodone for her right shoulder pain which is helping. Denies bleeding.   Oncology History   Primary cancer of right lung metastatic to other site [facility])   Staging form: Lung, AJCC 7th Edition     Clinical stage from 05/14/2015: Stage IV (T1a, N3, M1b) - Signed by Truitt Merle, MD on 06/07/2015       Primary cancer of right lung metastatic to other site [facility])   05/11/2015 Imaging    Brain MRI with and without contrast showed at least 6 subcentimeter supra tentorial cortical-based enhanced nodules compatible with nonhemorrhagic metastatic disease. Local edema without significant mass effect.      05/11/2015 Imaging    CT chest, abdomen and pelvis with contrast showed right upper lobe nodule 1.7 cm, with right hilar, bilateral mediastinal, and the thoracic inlet adenopathy.      05/14/2015 Initial Diagnosis    Primary cancer of right lung metastatic to other site [facility])      05/14/2015 Initial Biopsy    Bronchoscopy and EBUS biopsy of 4R node showed non-small cell carcinoma,  insufficient material for additional studies. The biopsy or over the right upper lobe lung nodule was negative.      05/22/2015 - 05/27/2015 Radiation Therapy    SRS to brain metastases       06/05/2015 Miscellaneous    PD-L1 IHC <1%       06/05/2015 Pathology Results    Right upper lobe lung nodule core needle biopsy showed poorly differentiated non-small cell lung cancer with associated necrosis, tumor cells are positive for cytokeratin 5/6, suggesting squamous cell carcinoma.      06/05/2015 Procedure    IR RUL lung nodule biopsy       06/19/2015 -  Chemotherapy    Carboplatin AUC 5 on D1,  and abraxane 122m/m2 on Day 1, 8 and 15, every 28 days       09/09/2015 -  Radiation Therapy    Pt received SBRT to her new brain mets        Review of Systems  Constitutional: Positive for malaise/fatigue and weight loss. Negative for chills and fever.  HENT: Negative.   Eyes: Negative.   Respiratory: Positive for cough. Negative for hemoptysis, sputum production, shortness of breath and wheezing.   Cardiovascular: Negative.   Gastrointestinal: Positive for heartburn and nausea. Negative for abdominal pain, blood in stool, constipation, diarrhea and vomiting.  Genitourinary: Negative.   Musculoskeletal: Positive for myalgias and neck pain. Negative for falls.  Skin: Negative.   Neurological: Positive for dizziness and weakness. Negative for tingling, tremors, sensory change, speech change, focal  weakness, seizures and loss of consciousness.  Endo/Heme/Allergies: Negative.   Psychiatric/Behavioral: Negative.     Past Medical History:  Diagnosis Date  . Asthmatic bronchitis   . Brain cancer ([place])   . Breast cancer ([place])   . Chronic bronchitis ([place])   . Family history of adverse reaction to anesthesia    "my son passed away I think related to the anesthesia"  . Hyperlipidemia   . Hypertension   . Seizures ([place]) 05/10/2015   "it may have been related to me taking Cipro" (05/11/2015)  .  Sinus headache     Past Surgical History:  Procedure Laterality Date  . ABDOMINAL HYSTERECTOMY    . APPENDECTOMY    . BREAST BIOPSY Right   . BREAST LUMPECTOMY Right   . CATARACT EXTRACTION Right 08/26/2015  . LAPAROSCOPIC CHOLECYSTECTOMY    . VIDEO BRONCHOSCOPY WITH ENDOBRONCHIAL NAVIGATION N/A 05/14/2015   Procedure: VIDEO BRONCHOSCOPY WITH ENDOBRONCHIAL NAVIGATION;  Surgeon: Collene Gobble, MD;  Location: [place];  Service: Thoracic;  Laterality: N/A;  . VIDEO BRONCHOSCOPY WITH ENDOBRONCHIAL ULTRASOUND N/A 05/14/2015   Procedure: VIDEO BRONCHOSCOPY WITH ENDOBRONCHIAL ULTRASOUND;  Surgeon: Collene Gobble, MD;  Location: [place];  Service: Thoracic;  Laterality: N/A;    has HYPERLIPIDEMIA; TOBACCO ABUSE; Essential hypertension; ALLERGIC RHINITIS; COPD; GERD; Personal history of malignant neoplasm of breast; Aphasia; Abnormal CT scan, head; Pulmonary nodule, right; Malnutrition of moderate degree; Pyrexia; Lung nodule; Mediastinal adenopathy; Elective surgery; Primary cancer of right lung metastatic to other site [facility]); Anemia in neoplastic disease; 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    has No Known Allergies.    Medication List       Accurate as of 11/19/15 10:52 AM. Always use your most recent med list.          albuterol (2.5 MG/3ML) 0.083% nebulizer solution Commonly known as:  PROVENTIL Take 2.5 mg by nebulization every 6 (six) hours as needed for wheezing or shortness of breath. Not sure of concentration   aspirin EC 81 MG tablet Take 81 mg by mouth every morning.   cholecalciferol 1000 units tablet Commonly known as:  VITAMIN D Take 1 tablet (1,000 Units total) by mouth daily.   diphenhydramine-acetaminophen 25-500 MG Tabs tablet Commonly known as:  TYLENOL PM Take 2 tablets by mouth at bedtime as needed (sleep/pain).   doxycycline  100 MG capsule Commonly known as:  VIBRAMYCIN Take 1 capsule (100 mg total) by mouth 2 (two) times daily. One po bid x 7 days   esomeprazole 20 MG packet Commonly known as:  NEXIUM Take 20 mg by mouth daily before breakfast.   ferrous sulfate 325 (65 FE) MG tablet Take 325 mg by mouth 2 (two) times daily.   HYDROcodone-acetaminophen 5-325 MG tablet Commonly known as:  NORCO Take 1 tablet by mouth every 6 (six) hours as needed for severe pain.   lactobacillus acidophilus Tabs tablet Take 1 tablet by mouth every morning.   levETIRAcetam 100 MG/ML solution Commonly known as:  KEPPRA Take 5 mLs (500 mg total) by mouth 2 (two) times daily.   LORazepam 0.5 MG tablet Commonly known as:  ATIVAN Take 1 tablet (0.5 mg total) by mouth every 6 (six) hours as needed (Nausea or vomiting).   mirtazapine 15 MG tablet Commonly known as:  REMERON Take 1 tablet (15 mg total) by mouth at bedtime.   ondansetron 8 MG tablet  Commonly known as:  ZOFRAN Take 1 tablet (8 mg total) by mouth every 8 (eight) hours as needed for nausea or vomiting.   prochlorperazine 10 MG tablet Commonly known as:  COMPAZINE Take 1 tablet (10 mg total) by mouth every 6 (six) hours as needed (Nausea or vomiting).   zolpidem 5 MG tablet Commonly known as:  AMBIEN Take 1 tablet (5 mg total) by mouth at bedtime as needed for sleep.        PHYSICAL EXAMINATION  Oncology Vitals 11/19/2015 11/16/2015  Height 155 cm -  Weight 48.081 kg -  Weight (lbs) 106 lbs -  BMI (kg/m2) 20.03 kg/m2 -  Temp 98.2 -  Pulse 94 98  Resp 16 16  SpO2 100 99  BSA (m2) 1.44 m2 -   BP Readings from Last 2 Encounters:  11/19/15 (!) 130/52  11/16/15 120/58    Physical Exam  Constitutional: She is oriented to person, place, and time and well-developed, well-nourished, and in no distress.  HENT:  Head: Normocephalic and atraumatic.  Mouth/Throat: No oropharyngeal exudate.  Eyes: Conjunctivae and EOM are normal. Pupils are equal,  round, and reactive to light. No scleral icterus.  Neck: Normal range of motion. Neck supple. No tracheal deviation present. No thyromegaly present.  Cardiovascular: Normal rate, regular rhythm and normal heart sounds.   Pulmonary/Chest: Effort normal and breath sounds normal. No respiratory distress. She has no wheezes.  Abdominal: Soft. Bowel sounds are normal. She exhibits no distension and no mass. There is no tenderness.  Musculoskeletal: Normal range of motion. She exhibits no edema.  Lymphadenopathy:    She has no cervical adenopathy.  Neurological: She is alert and oriented to person, place, and time. Gait normal.  Skin: Skin is warm and dry.  Psychiatric: Mood, memory, affect and judgment normal.  Vitals reviewed.   LABORATORY DATA:. No visits with results within 3 Day(s) from this visit.  Latest known visit with results is:  Admission on 11/16/2015, Discharged on 11/16/2015  Component Date Value Ref Range Status  . Sodium 11/16/2015 132* 135 - 145 mmol/L Final  . Potassium 11/16/2015 3.7  3.5 - 5.1 mmol/L Final  . Chloride 11/16/2015 96* 101 - 111 mmol/L Final  . CO2 11/16/2015 23  22 - 32 mmol/L Final  . Glucose, Bld 11/16/2015 104* 65 - 99 mg/dL Final  . BUN 11/16/2015 7  6 - 20 mg/dL Final  . Creatinine, Ser 11/16/2015 0.44  0.44 - 1.00 mg/dL Final  . Calcium 11/16/2015 9.5  8.9 - 10.3 mg/dL Final  . GFR calc non Af Amer 11/16/2015 >60  >60 mL/min Final  . GFR calc Af Amer 11/16/2015 >60  >60 mL/min Final   Comment: (NOTE) The eGFR has been calculated using the CKD EPI equation. This calculation has not been validated in all clinical situations. eGFR's persistently <60 mL/min signify possible Chronic Kidney Disease.   . Anion gap 11/16/2015 13  5 - 15 Final  . Troponin I 11/16/2015 <0.03  <0.03 ng/mL Final  . WBC 11/16/2015 4.9  4.0 - 10.5 K/uL Final  . RBC 11/16/2015 2.89* 3.87 - 5.11 MIL/uL Final  . Hemoglobin 11/16/2015 7.7* 12.0 - 15.0 g/dL Final  . HCT  11/16/2015 24.9* 36.0 - 46.0 % Final  . MCV 11/16/2015 86.2  78.0 - 100.0 fL Final  . MCH 11/16/2015 26.6  26.0 - 34.0 pg Final  . MCHC 11/16/2015 30.9  30.0 - 36.0 g/dL Final  . RDW 11/16/2015 16.9* 11.5 - 15.5 % Final  .  Platelets 11/16/2015 405* 150 - 400 K/uL Final  . Neutrophils Relative % 11/16/2015 65  % Final  . Neutro Abs 11/16/2015 3.2  1.7 - 7.7 K/uL Final  . Lymphocytes Relative 11/16/2015 27  % Final  . Lymphs Abs 11/16/2015 1.3  0.7 - 4.0 K/uL Final  . Monocytes Relative 11/16/2015 8  % Final  . Monocytes Absolute 11/16/2015 0.4  0.1 - 1.0 K/uL Final  . Eosinophils Relative 11/16/2015 0  % Final  . Eosinophils Absolute 11/16/2015 0.0  0.0 - 0.7 K/uL Final  . Basophils Relative 11/16/2015 0  % Final  . Basophils Absolute 11/16/2015 0.0  0.0 - 0.1 K/uL Final  . Fecal Occult Bld 11/16/2015 NEGATIVE  NEGATIVE Final    RADIOGRAPHIC STUDIES: Dg Chest 2 View  Result Date: 11/16/2015 CLINICAL DATA:  Dyspnea, dry cough, sternal chest pain and right-sided neck and shoulder pain over 3 weeks. History of breast cancer and lung cancer. EXAM: CHEST  2 VIEW COMPARISON:  Chest radiograph of 10/20/2015 and chest CT 10/20/2015 FINDINGS: New area of pulmonary consolidation suggested adjacent to known right upper lobe mass. Otherwise, the lungs appear unremarkable. Heart is normal in size. Right paratracheal soft tissue prominence consistent with known mediastinal adenopathy. The thoracic aorta is slightly tortuous without aneurysm. Atherosclerosis is noted of the thoracic aortic arch. Axillary clips are seen on the right. IMPRESSION: New area of pulmonary consolidation adjacent to and partially obscuring known right upper lobe mass. Post obstructive pulmonary consolidation or potentially pneumonia in the right upper lobe might account this appearance. Electronically Signed   By: Ashley Royalty M.D.   On: 11/16/2015 17:47    ASSESSMENT/PLAN:    No problem-specific Assessment & Plan notes found for  this encounter. The patient is a 65 year old female with stage IV lung cancer. Seen today for weakness, nausea, and dizziness. Labs today demonstrate hypercalcemia and anemia. Corrected calcium is ~11.6. Some of her symptoms may be related to the hypercalcemia. Will give Zometa 4 mg IV today with 500 cc NS today.  For her anemia, will give 2 units PRBCs on 11/20/15. Risks and benefits of the transfusion have been discussed. She is willing to proceed.  Discussed plan with Dr. Burr Medico. Due to the fact that the patient is not feeling well today, will cancel chemo tomorrow. Will obtain a restaging PET scan within 1 week to evaluate for disease progression and possible bone metastasis given that she is hypercalcemic. She will return 1-2 days after her scan to discuss the results. Will also schedule for possible chemo the same day that she returns for a visit. CXR results from ER reviewed a she does not need to resume an antibiotic at this time.    Patient stated understanding of all instructions; and was in agreement with this plan of care. The patient knows to call the clinic with any problems, questions or concerns.    Mikey Bussing, NP 11/19/2015

## 2015-11-19 NOTE — Patient Instructions (Signed)

## 2015-11-20 ENCOUNTER — Other Ambulatory Visit: Payer: Self-pay | Admitting: Hematology

## 2015-11-20 ENCOUNTER — Other Ambulatory Visit: Payer: BLUE CROSS/BLUE SHIELD

## 2015-11-20 ENCOUNTER — Ambulatory Visit (HOSPITAL_BASED_OUTPATIENT_CLINIC_OR_DEPARTMENT_OTHER): Payer: Medicare Other

## 2015-11-20 ENCOUNTER — Ambulatory Visit: Payer: BLUE CROSS/BLUE SHIELD | Admitting: Nurse Practitioner

## 2015-11-20 DIAGNOSIS — C778 Secondary and unspecified malignant neoplasm of lymph nodes of multiple regions: Secondary | ICD-10-CM

## 2015-11-20 DIAGNOSIS — C3411 Malignant neoplasm of upper lobe, right bronchus or lung: Secondary | ICD-10-CM | POA: Diagnosis present

## 2015-11-20 DIAGNOSIS — D630 Anemia in neoplastic disease: Secondary | ICD-10-CM

## 2015-11-20 DIAGNOSIS — C7931 Secondary malignant neoplasm of brain: Secondary | ICD-10-CM | POA: Diagnosis not present

## 2015-11-20 MED ORDER — DIPHENHYDRAMINE HCL 25 MG PO CAPS
25.0000 mg | ORAL_CAPSULE | Freq: Once | ORAL | Status: AC
  Administered 2015-11-20: 25 mg via ORAL

## 2015-11-20 MED ORDER — ACETAMINOPHEN 325 MG PO TABS
650.0000 mg | ORAL_TABLET | Freq: Once | ORAL | Status: AC
  Administered 2015-11-20: 650 mg via ORAL

## 2015-11-20 MED ORDER — ACETAMINOPHEN 325 MG PO TABS
ORAL_TABLET | ORAL | Status: AC
  Filled 2015-11-20: qty 2

## 2015-11-20 MED ORDER — DIPHENHYDRAMINE HCL 25 MG PO CAPS
ORAL_CAPSULE | ORAL | Status: AC
  Filled 2015-11-20: qty 1

## 2015-11-20 NOTE — Patient Instructions (Signed)

## 2015-11-21 ENCOUNTER — Telehealth: Payer: Self-pay | Admitting: Hematology

## 2015-11-21 NOTE — Telephone Encounter (Signed)
PATIENT ALREADY ON SCHEDULE FOR LAB/FU/TX IN ONE WEEK. [facility]OLOG[facility] WILL CALL RE SCAN.

## 2015-11-23 LAB — TYPE AND SCREEN
ABO/RH(D): A POS
Antibody Screen: NEGATIVE
UNIT DIVISION: 0
UNIT DIVISION: 0

## 2015-11-26 ENCOUNTER — Other Ambulatory Visit: Payer: Self-pay

## 2015-11-26 ENCOUNTER — Encounter (HOSPITAL_COMMUNITY): Payer: Self-pay | Admitting: Emergency Medicine

## 2015-11-26 ENCOUNTER — Emergency Department (HOSPITAL_COMMUNITY): Payer: Medicare Other

## 2015-11-26 ENCOUNTER — Encounter: Payer: Self-pay | Admitting: Pharmacist

## 2015-11-26 ENCOUNTER — Emergency Department (HOSPITAL_COMMUNITY)
Admission: EM | Admit: 2015-11-26 | Discharge: 2015-11-26 | Disposition: A | Discharge status: Home / Self Care | Payer: Medicare Other | Attending: Emergency Medicine | Admitting: Emergency Medicine

## 2015-11-26 DIAGNOSIS — R0602 Shortness of breath: Secondary | ICD-10-CM

## 2015-11-26 DIAGNOSIS — Z79899 Other long term (current) drug therapy: Secondary | ICD-10-CM | POA: Diagnosis not present

## 2015-11-26 DIAGNOSIS — Z87891 Personal history of nicotine dependence: Secondary | ICD-10-CM | POA: Diagnosis not present

## 2015-11-26 DIAGNOSIS — J449 Chronic obstructive pulmonary disease, unspecified: Secondary | ICD-10-CM | POA: Insufficient documentation

## 2015-11-26 DIAGNOSIS — Z85841 Personal history of malignant neoplasm of brain: Secondary | ICD-10-CM | POA: Insufficient documentation

## 2015-11-26 DIAGNOSIS — Z7982 Long term (current) use of aspirin: Secondary | ICD-10-CM | POA: Insufficient documentation

## 2015-11-26 DIAGNOSIS — I10 Essential (primary) hypertension: Secondary | ICD-10-CM | POA: Diagnosis not present

## 2015-11-26 DIAGNOSIS — C3401 Malignant neoplasm of right main bronchus: Secondary | ICD-10-CM

## 2015-11-26 DIAGNOSIS — C3491 Malignant neoplasm of unspecified part of right bronchus or lung: Secondary | ICD-10-CM | POA: Insufficient documentation

## 2015-11-26 LAB — CBC WITH DIFFERENTIAL/PLATELET
Basophils Absolute: 0 10*3/uL (ref 0.0–0.1)
Basophils Relative: 0 %
EOS ABS: 0 10*3/uL (ref 0.0–0.7)
Eosinophils Relative: 0 %
HCT: 38.7 % (ref 36.0–46.0)
HEMOGLOBIN: 12.5 g/dL (ref 12.0–15.0)
LYMPHS ABS: 1.3 10*3/uL (ref 0.7–4.0)
LYMPHS PCT: 24 %
MCH: 28.5 pg (ref 26.0–34.0)
MCHC: 32.3 g/dL (ref 30.0–36.0)
MCV: 88.2 fL (ref 78.0–100.0)
MONOS PCT: 18 %
Monocytes Absolute: 1 10*3/uL (ref 0.1–1.0)
NEUTROS PCT: 58 %
Neutro Abs: 3.1 10*3/uL (ref 1.7–7.7)
Platelets: 603 10*3/uL — ABNORMAL HIGH (ref 150–400)
RBC: 4.39 MIL/uL (ref 3.87–5.11)
RDW: 16.4 % — ABNORMAL HIGH (ref 11.5–15.5)
WBC: 5.5 10*3/uL (ref 4.0–10.5)

## 2015-11-26 LAB — COMPREHENSIVE METABOLIC PANEL
ALK PHOS: 104 U/L (ref 38–126)
ALT: 13 U/L — ABNORMAL LOW (ref 14–54)
ANION GAP: 13 (ref 5–15)
AST: 16 U/L (ref 15–41)
Albumin: 3.4 g/dL — ABNORMAL LOW (ref 3.5–5.0)
BILIRUBIN TOTAL: 0.5 mg/dL (ref 0.3–1.2)
BUN: 9 mg/dL (ref 6–20)
CALCIUM: 10 mg/dL (ref 8.9–10.3)
CO2: 24 mmol/L (ref 22–32)
CREATININE: 0.42 mg/dL — AB (ref 0.44–1.00)
Chloride: 100 mmol/L — ABNORMAL LOW (ref 101–111)
Glucose, Bld: 109 mg/dL — ABNORMAL HIGH (ref 65–99)
Potassium: 3.6 mmol/L (ref 3.5–5.1)
SODIUM: 137 mmol/L (ref 135–145)
TOTAL PROTEIN: 8.7 g/dL — AB (ref 6.5–8.1)

## 2015-11-26 MED ORDER — IOPAMIDOL (ISOVUE-300) INJECTION 61%
75.0000 mL | Freq: Once | INTRAVENOUS | Status: AC | PRN
  Administered 2015-11-26: 75 mL via INTRAVENOUS

## 2015-11-26 NOTE — Discharge Instructions (Signed)
Follow-up with your lung cancer doctor tomorrow.

## 2015-11-26 NOTE — ED Notes (Signed)
Fluids given to patient

## 2015-11-26 NOTE — ED Notes (Signed)
Bed: WA02 Expected date:  Expected time:  Means of arrival:  Comments: EMS-SOB 

## 2015-11-26 NOTE — ED Provider Notes (Signed)
[facility] DEPT Provider Note   CSN: [id number] Arrival date & time: 11/26/15  1118     History   Chief Complaint Chief Complaint  Patient presents with  . Shortness of Breath    HPI Rose [name] is a 65 y.o. female.  65 y/o female w/ h/o lung CA here w/ increased dyspnea Also has h/o anemia and takes iron tx--had blood transfusion a week ago for low HB (about 7) Has had worsening dyspnea x 1 month Sx began today at rest and made better after she used her inhaler Pt receiving chemo and next tx is tomorrow No fever, cough, vomiting, diarrhea No chest pain or abd pain      Past Medical History:  Diagnosis Date  . Asthmatic bronchitis   . Brain cancer ([place])   . Breast cancer ([place])   . Chronic bronchitis ([place])   . Family history of adverse reaction to anesthesia    "my son passed away I think related to the anesthesia"  . Hyperlipidemia   . Hypertension   . Seizures ([place]) 05/10/2015   "it may have been related to me taking Cipro" (05/11/2015)  . Sinus headache     Patient Active Problem List   Diagnosis Date Noted  . Hypercalcemia 10/16/2015  . Port catheter in place 10/02/2015  . Depression 09/25/2015  . Anemia, iron deficiency 06/26/2015  . Partial seizure ([place]) 06/17/2015  . 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 Primary cancer of right lung metastatic to other site ([place]) 06/07/2015  . Mediastinal adenopathy 05/14/2015  . Elective surgery   . Malnutrition of moderate degree 05/12/2015  . Pyrexia   . Lung nodule   . Aphasia 05/11/2015  . Abnormal CT scan, head 05/11/2015  . Pulmonary nodule, right 05/11/2015  . HYPERLIPIDEMIA 04/17/2008  . TOBACCO ABUSE 02/12/2008  . Personal history of malignant neoplasm of breast 02/12/2008  . GERD 07/06/2007  . Essential hypertension 03/04/2007  . ALLERGIC RHINITIS 03/04/2007  . COPD 03/04/2007    Past Surgical History:  Procedure  Laterality Date  . ABDOMINAL HYSTERECTOMY    . APPENDECTOMY    . BREAST BIOPSY Right   . BREAST LUMPECTOMY Right   . CATARACT EXTRACTION Right 08/26/2015  . LAPAROSCOPIC CHOLECYSTECTOMY    . VIDEO BRONCHOSCOPY WITH ENDOBRONCHIAL NAVIGATION N/A 05/14/2015   Procedure: VIDEO BRONCHOSCOPY WITH ENDOBRONCHIAL NAVIGATION;  Surgeon: Collene Gobble, MD;  Location: [place];  Service: Thoracic;  Laterality: N/A;  . VIDEO BRONCHOSCOPY WITH ENDOBRONCHIAL ULTRASOUND N/A 05/14/2015   Procedure: VIDEO BRONCHOSCOPY WITH ENDOBRONCHIAL ULTRASOUND;  Surgeon: Collene Gobble, MD;  Location: [place];  Service: Thoracic;  Laterality: N/A;    OB History    No data available       Home Medications    Prior to Admission medications   Medication Sig Start Date End Date Taking? Authorizing Provider  albuterol (PROVENTIL) (2.5 MG/3ML) 0.083% nebulizer solution Take 2.5 mg by nebulization every 6 (six) hours as needed for wheezing or shortness of breath. Not sure of concentration    Historical Provider, MD  aspirin EC 81 MG tablet Take 81 mg by mouth every morning.     Historical Provider, MD  cholecalciferol (VITAMIN D) 1000 units tablet Take 1 tablet (1,000 Units total) by mouth daily. Patient not taking: Reported on 11/19/2015 06/17/15   Marcial Pacas, MD  diphenhydramine-acetaminophen (TYLENOL PM) 25-500 MG TABS tablet Take 2 tablets by mouth at bedtime as needed (sleep/pain).  Historical Provider, MD  doxycycline (VIBRAMYCIN) 100 MG capsule Take 1 capsule (100 mg total) by mouth 2 (two) times daily. One po bid x 7 days Patient not taking: Reported on 11/19/2015 11/16/15   Sherwood Gambler, MD  esomeprazole (NEXIUM) 20 MG packet Take 20 mg by mouth daily before breakfast. Patient not taking: Reported on 11/19/2015 11/13/15   Truitt Merle, MD  ferrous sulfate 325 (65 FE) MG tablet Take 325 mg by mouth 2 (two) times daily.    Historical Provider, MD  HYDROcodone-acetaminophen (NORCO) 5-325 MG tablet Take 1 tablet by mouth  every 6 (six) hours as needed for severe pain. 11/16/15   Sherwood Gambler, MD  lactobacillus acidophilus (BACID) TABS tablet Take 1 tablet by mouth every morning.     Historical Provider, MD  levETIRAcetam (KEPPRA) 100 MG/ML solution Take 5 mLs (500 mg total) by mouth 2 (two) times daily. 06/22/15   Marcial Pacas, MD  LORazepam (ATIVAN) 0.5 MG tablet Take 1 tablet (0.5 mg total) by mouth every 6 (six) hours as needed (Nausea or vomiting). Patient not taking: Reported on 11/19/2015 06/19/15   Truitt Merle, MD  mirtazapine (REMERON) 15 MG tablet Take 1 tablet (15 mg total) by mouth at bedtime. Patient not taking: Reported on 11/19/2015 09/18/15   Truitt Merle, MD  ondansetron [facility]) 8 MG tablet Take 1 tablet (8 mg total) by mouth every 8 (eight) hours as needed for nausea or vomiting. 09/18/15   Truitt Merle, MD  prochlorperazine (COMPAZINE) 10 MG tablet Take 1 tablet (10 mg total) by mouth every 6 (six) hours as needed (Nausea or vomiting). 09/18/15   Truitt Merle, MD  zolpidem (AMBIEN) 5 MG tablet Take 1 tablet (5 mg total) by mouth at bedtime as needed for sleep. 11/13/15   Truitt Merle, MD    Family History Family History  Problem Relation Age of Onset  . Heart failure Father   . Breast cancer Mother 58  . Gastric cancer Daughter 64  . Diabetes Sister   . Hypertension Sister     Social History Social History  Substance Use Topics  . Smoking status: Former Smoker    Packs/day: 0.50    Years: 36.00    Types: Cigarettes  . Smokeless tobacco: Never Used     Comment: quit 2 weeks ago 05/13/15  . Alcohol use No     Allergies   Review of patient's allergies indicates no known allergies.   Review of Systems Review of Systems  All other systems reviewed and are negative.    Physical Exam Updated Vital Signs BP 148/85 (BP Location: Left Arm)   Pulse 90   Temp 98.4 F (36.9 C) (Oral)   Resp 18   Ht '5\' 1"'$  (1.549 m)   Wt 48.1 kg   SpO2 100%   BMI 20.03 kg/m   Physical Exam  Constitutional: She is  oriented to person, place, and time. She appears well-developed and well-nourished.  Non-toxic appearance. No distress.  HENT:  Head: Normocephalic and atraumatic.  Eyes: Conjunctivae, EOM and lids are normal. Pupils are equal, round, and reactive to light.  Neck: Normal range of motion. Neck supple. No tracheal deviation present. No thyroid mass present.  Cardiovascular: Normal rate, regular rhythm and normal heart sounds.  Exam reveals no gallop.   No murmur heard. Pulmonary/Chest: Effort normal and breath sounds normal. No stridor. No respiratory distress. She has no decreased breath sounds. She has no wheezes. She has no rhonchi. She has no rales.  Abdominal: Soft.  Normal appearance and bowel sounds are normal. She exhibits no distension. There is no tenderness. There is no rebound and no CVA tenderness.  Musculoskeletal: Normal range of motion. She exhibits no edema or tenderness.  Neurological: She is alert and oriented to person, place, and time. She has normal strength. No cranial nerve deficit or sensory deficit. GCS eye subscore is 4. GCS verbal subscore is 5. GCS motor subscore is 6.  Skin: Skin is warm and dry. No abrasion and no rash noted.  Psychiatric: She has a normal mood and affect. Her speech is normal and behavior is normal.  Nursing note and vitals reviewed.    ED Treatments / Results  Labs (all labs ordered are listed, but only abnormal results are displayed) Labs Reviewed  CBC WITH DIFFERENTIAL/PLATELET  COMPREHENSIVE METABOLIC PANEL    EKG  EKG Interpretation None       Radiology No results found.  Procedures Procedures (including critical care time)  Medications Ordered in ED Medications - No data to display   Initial Impression / Assessment and Plan / ED Course  I have reviewed the triage vital signs and the nursing notes.  Pertinent labs & imaging results that were available during my care of the patient were reviewed by me and considered in my  medical decision making (see chart for details).  Clinical Course    Patient had a chest CT which showed some progression of her lung cancer. She is scheduled for chemotherapy tomorrow. She is not hypoxic here. Respiratory rate is stable. Pulse oximetry is stable. Hemoglobin is stable as well 2. Hemoptysis likely from her worsening lung cancer. Follow-up with her doctor tomorrow  Final Clinical Impressions(s) / ED Diagnoses   Final diagnoses:  SOB (shortness of breath)    New Prescriptions New Prescriptions   No medications on file     Lacretia Leigh, MD 11/26/15 1603

## 2015-11-26 NOTE — ED Notes (Signed)
Patient is A & O x4.  She understood discharge instructions. 

## 2015-11-26 NOTE — ED Triage Notes (Signed)
Per EMS, patient is c/o of SOB since 10-25.  She has a history of lung cancer.  Patient states she coughed up blood yesterday.  Lung sounds was clear with EMS, 99% on room air.  Patient is A & Ox4.  BP: 147/89 HR:98 R:16 99% on room air  CBG:110

## 2015-11-27 ENCOUNTER — Telehealth: Payer: Self-pay | Admitting: Hematology

## 2015-11-27 ENCOUNTER — Ambulatory Visit (HOSPITAL_BASED_OUTPATIENT_CLINIC_OR_DEPARTMENT_OTHER): Payer: Medicare Other | Admitting: Hematology

## 2015-11-27 ENCOUNTER — Other Ambulatory Visit: Payer: Self-pay | Admitting: Radiation Therapy

## 2015-11-27 ENCOUNTER — Telehealth: Payer: Self-pay | Admitting: *Deleted

## 2015-11-27 ENCOUNTER — Other Ambulatory Visit: Payer: BLUE CROSS/BLUE SHIELD

## 2015-11-27 ENCOUNTER — Ambulatory Visit: Payer: BLUE CROSS/BLUE SHIELD

## 2015-11-27 VITALS — BP 132/70 | HR 93 | Temp 98.0°F | Resp 17 | Ht 61.0 in | Wt 102.9 lb

## 2015-11-27 DIAGNOSIS — M542 Cervicalgia: Secondary | ICD-10-CM

## 2015-11-27 DIAGNOSIS — J449 Chronic obstructive pulmonary disease, unspecified: Secondary | ICD-10-CM

## 2015-11-27 DIAGNOSIS — D630 Anemia in neoplastic disease: Secondary | ICD-10-CM

## 2015-11-27 DIAGNOSIS — D509 Iron deficiency anemia, unspecified: Secondary | ICD-10-CM

## 2015-11-27 DIAGNOSIS — C7949 Secondary malignant neoplasm of other parts of nervous system: Principal | ICD-10-CM

## 2015-11-27 DIAGNOSIS — C3491 Malignant neoplasm of unspecified part of right bronchus or lung: Secondary | ICD-10-CM

## 2015-11-27 DIAGNOSIS — F329 Major depressive disorder, single episode, unspecified: Secondary | ICD-10-CM

## 2015-11-27 DIAGNOSIS — I10 Essential (primary) hypertension: Secondary | ICD-10-CM | POA: Diagnosis not present

## 2015-11-27 DIAGNOSIS — C7931 Secondary malignant neoplasm of brain: Secondary | ICD-10-CM

## 2015-11-27 DIAGNOSIS — G620 Drug-induced polyneuropathy: Secondary | ICD-10-CM

## 2015-11-27 DIAGNOSIS — C3411 Malignant neoplasm of upper lobe, right bronchus or lung: Secondary | ICD-10-CM

## 2015-11-27 DIAGNOSIS — G4700 Insomnia, unspecified: Secondary | ICD-10-CM

## 2015-11-27 DIAGNOSIS — F32A Depression, unspecified: Secondary | ICD-10-CM

## 2015-11-27 DIAGNOSIS — R042 Hemoptysis: Secondary | ICD-10-CM

## 2015-11-27 DIAGNOSIS — Z66 Do not resuscitate: Secondary | ICD-10-CM

## 2015-11-27 DIAGNOSIS — C778 Secondary and unspecified malignant neoplasm of lymph nodes of multiple regions: Secondary | ICD-10-CM | POA: Diagnosis not present

## 2015-11-27 DIAGNOSIS — F419 Anxiety disorder, unspecified: Secondary | ICD-10-CM | POA: Diagnosis not present

## 2015-11-27 DIAGNOSIS — R109 Unspecified abdominal pain: Secondary | ICD-10-CM | POA: Diagnosis not present

## 2015-11-27 LAB — TYPE AND SCREEN
ABO/RH(D): A POS
Antibody Screen: NEGATIVE
DAT, IgG: NEGATIVE
UNIT DIVISION: 0

## 2015-11-27 MED ORDER — MIRTAZAPINE 15 MG PO TABS: 15.0000 mg | ORAL_TABLET | Freq: Every day | ORAL | 1 refills | Status: DC

## 2015-11-27 NOTE — Telephone Encounter (Signed)
Message sent to chemo scheduler to add chemo. Labs and follow up appointments scheduled per 11/27/15 los. Appointments scheduled per 11/27/15 los. AVS report and appointment schedule given to patient, per 11/27/15 los.

## 2015-11-27 NOTE — Progress Notes (Signed)
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 Local edema without significant mass effect.      05/11/2015 Imaging    CT chest, abdomen and pelvis with contrast showed right upper lobe nodule 1.7 cm, with right hilar, bilateral mediastinal, and the thoracic inlet adenopathy.      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 The biopsy or over the right upper lobe lung nodule was negative.      05/22/2015 - 05/27/2015 Radiation Therapy    SRS to brain metastases       06/05/2015 Miscellaneous    PD-L1 IHC <1%       06/05/2015 Pathology Results    Right upper lobe lung nodule core needle biopsy showed poorly differentiated non-small cell lung cancer with associated necrosis, tumor cells are positive for cytokeratin 5/6, suggesting squamous cell carcinoma.      06/05/2015 Procedure    IR RUL lung nodule biopsy       06/19/2015 -  Chemotherapy    Carboplatin AUC 5 on D1,  and abraxane 16m/m2 on Day 1, 8 and 15, every 28 days       09/09/2015 -  Radiation Therapy    Pt received SBRT to her new brain mets       History of present illness  (05/12/2015)  Patient is a 65year old female with past medical history of hypertension, 30 year smoking history, remote history of breast cancer in 1992, presented with sudden onset episodes of difficulty speaking, drooling, and facial twitching on 09/09/2015. It lasted about 7-8 minutes. EMS was called and she was brought to the hospital. Brain scan showed multiple brain lesions, highly suspicious for metastatic disease. CT chest abdomen and pelvis revealed a 2.3 cm right upper lobe lung nodule, along was hilar and mediastinal adenopathy. Pulmonary was consulted for biopsy. I saw her in the hospital, along with 2 of her granddaughters. She feels well now, denies any significant pain, dyspnea, or other symptoms. She does have mild chronic cough nonproductive. She states that her appetite has been low and she lost about 10-15 pounds in the past year. 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 She has 2 sisters and graduations in the area.  CURRENT THERAPY: Carboplatin AUC 5 on day 1, Abraxane 100 mg/m on day 1, 8 and 15, every 28 days, started on 06/19/2015, changed to carbo AUC 5 and abraxane 717mm2 since cycle 3 due to cytopenia, cycle 4 was postponed due to brain radiation   INTERVAL HISTORY:  Mrs. SmSahlineturns for follow-up and chemo. She had mild hemoptysis and was evaluated at ED yesterday, CT  chest showed disease progression, she was discharged home. She has not had recurrent hemoptysis since yesterday. No fever, cough, sputum production or chest pain. She is still quite fatigued, was low appetite, came in with a wheelchair with her granddaughter.  REVIEW OF SYSTEMS:   Constitutional: Denies fevers, chills, (+) fatigue and weight loss  Eyes: Denies blurriness of vision Ears, nose, mouth, throat, and face: Denies mucositis or sore throat Respiratory: Denies cough, dyspnea or wheezes, (+) hemoptysis Cardiovascular: Denies palpitation, chest discomfort or lower extremity  swelling Gastrointestinal:  Denies nausea, heartburn or change in bowel habits Skin: Denies abnormal skin rashes Lymphatics: Denies new lymphadenopathy or easy bruising Neurological:Denies numbness, tingling or new weaknesses Behavioral/Psych: Mood is stable, no new changes  All other systems were reviewed with the patient and are negative.  MEDICAL HISTORY:  Past Medical History:  Diagnosis Date  . Asthmatic bronchitis   . Brain cancer ([place])   . Breast cancer ([place])   . Chronic bronchitis ([place])   . Family history of adverse reaction to anesthesia    "my son passed away I think related to the anesthesia"  . Hyperlipidemia   . Hypertension   . Seizures ([place]) 05/10/2015   "it may have been related to me taking Cipro" (05/11/2015)  . Sinus headache     SURGICAL HISTORY: Past Surgical History:  Procedure Laterality Date  . ABDOMINAL HYSTERECTOMY    . APPENDECTOMY    . BREAST BIOPSY Right   . BREAST LUMPECTOMY Right   . CATARACT EXTRACTION Right 08/26/2015  . LAPAROSCOPIC CHOLECYSTECTOMY    . VIDEO BRONCHOSCOPY WITH ENDOBRONCHIAL NAVIGATION N/A 05/14/2015   Procedure: VIDEO BRONCHOSCOPY WITH ENDOBRONCHIAL NAVIGATION;  Surgeon: Collene Gobble, MD;  Location: [place];  Service: Thoracic;  Laterality: N/A;  . VIDEO BRONCHOSCOPY WITH ENDOBRONCHIAL ULTRASOUND N/A 05/14/2015   Procedure: VIDEO BRONCHOSCOPY WITH ENDOBRONCHIAL ULTRASOUND;  Surgeon: Collene Gobble, MD;  Location: [place];  Service: Thoracic;  Laterality: N/A;    ALLERGIES:  has No Known Allergies.  MEDICATIONS:  Current Outpatient Prescriptions  Medication Sig Dispense Refill  . albuterol (PROVENTIL) (2.5 MG/3ML) 0.083% nebulizer solution Take 2.5 mg by nebulization every 6 (six) hours as needed for wheezing or shortness of breath. Not sure of concentration    . aspirin EC 81 MG tablet Take 81 mg by mouth every morning.     . cholecalciferol (VITAMIN D) 1000 units tablet Take 1 tablet (1,000 Units total) by mouth daily.  (Patient not taking: Reported on 11/26/2015) 180 tablet 6  . doxycycline (VIBRAMYCIN) 100 MG capsule Take 1 capsule (100 mg total) by mouth 2 (two) times daily. One po bid x 7 days (Patient not taking: Reported on 11/26/2015) 14 capsule 0  . esomeprazole (NEXIUM) 20 MG packet Take 20 mg by mouth daily before breakfast. (Patient not taking: Reported on 11/26/2015) 30 each 1  . ferrous sulfate 325 (65 FE) MG tablet Take 325 mg by mouth 2 (two) times daily.    Marland Kitchen HYDROcodone-acetaminophen (NORCO) 5-325 MG tablet Take 1 tablet by mouth every 6 (six) hours as needed for severe pain. 10 tablet 0  . lactobacillus acidophilus (BACID) TABS tablet Take 1 tablet by mouth every morning.     . levETIRAcetam (KEPPRA) 100 MG/ML solution Take 5 mLs (500 mg total) by mouth 2 (two) times daily. 473 mL 11  . LORazepam (ATIVAN) 0.5 MG tablet Take 1 tablet (0.5 mg total) by mouth every 6 (six) hours as needed (Nausea or vomiting). 30 tablet 0  .  mirtazapine (REMERON) 15 MG tablet Take 1 tablet (15 mg total) by mouth at bedtime. (Patient not taking: Reported on 11/26/2015) 30 tablet 2  . ondansetron (ZOFRAN) 8 MG tablet Take 1 tablet (8 mg total) by mouth every 8 (eight) hours as needed for nausea or vomiting. 30 tablet 1  . prochlorperazine (COMPAZINE) 10 MG tablet Take 1 tablet (10 mg total) by mouth every 6 (six) hours as needed (Nausea or vomiting). 30 tablet 1  . zolpidem (AMBIEN) 5 MG tablet Take 1 tablet (5 mg total) by mouth at bedtime as needed for sleep. 30 tablet 0   No current facility-administered medications for this visit.    Facility-Administered Medications Ordered in Other Visits  Medication Dose Route Frequency Provider Last Rate Last Dose  . sodium chloride 0.9 % injection 10 mL  10 mL Intracatheter PRN Truitt Merle, MD        PHYSICAL EXAMINATION: ECOG PERFORMANCE STATUS: 2-3 BP 132/70 (BP Location: Left Arm, Patient Position: Sitting)   Pulse 93   Temp 98 F (36.7 C) (Oral)   Resp 17   Ht _0   (1.549 m)   Wt 102 lb 14.4 oz (46.7 kg)   SpO2 100%   BMI 19.44 kg/m   GENERAL:alert, no distress and comfortable SKIN: skin color, texture, turgor are normal, no rashes or significant lesions EYES: normal, Conjunctiva are pink and non-injected, sclera clear OROPHARYNX:no exudate, no erythema and lips, buccal mucosa, and tongue normal  NECK: supple, thyroid normal size, non-tender, without nodularity LYMPH:  no palpable lymphadenopathy in the cervical, axillary or inguinal LUNGS: clear to auscultation and percussion with normal breathing effort HEART: regular rate & rhythm and no murmurs and no lower extremity edema ABDOMEN:abdomen soft, non-tender and normal bowel sounds Musculoskeletal:no cyanosis of digits and no clubbing  NEURO: alert & oriented x 3 with fluent speech, no focal motor/sensory deficits  LABORATORY DATA:  I have reviewed the data as listed CBC Latest Ref Rng & Units 11/26/2015 11/19/2015 11/16/2015  WBC 4.0 - 10.5 K/uL 5.5 4.4 4.9  Hemoglobin 12.0 - 15.0 g/dL 12.5 7.9(L) 7.7(L)  Hematocrit 36.0 - 46.0 % 38.7 25.0(L) 24.9(L)  Platelets 150 - 400 K/uL 603(H) 509(H) 405(H)     CMP Latest Ref Rng & Units 11/26/2015 11/19/2015 11/16/2015  Glucose 65 - 99 mg/dL 109(H) 109 104(H)  BUN 6 - 20 mg/dL 9 6.3(L) 7  Creatinine 0.44 - 1.00 mg/dL 0.42(L) 0.6 0.44  Sodium 135 - 145 mmol/L 137 137 132(L)  Potassium 3.5 - 5.1 mmol/L 3.6 3.7 3.7  Chloride 101 - 111 mmol/L 100(L) - 96(L)  CO2 22 - 32 mmol/L _1 Calcium 8.9 - 10.3 mg/dL 10.0 10.6(H) 9.5  Total Protein 6.5 - 8.1 g/dL 8.7(H) 8.7(H) -  Total Bilirubin 0.3 - 1.2 mg/dL 0.5 0.31 -  Alkaline Phos 38 - 126 U/L 104 120 -  AST 15 - 41 U/L 16 10 -  ALT 14 - 54 U/L 13(L) 7 -   PATHOLOGY REPORT Diagnosis 05/14/2015  Lung, biopsy, Right upper lobe - MILDLY INFLAMED LUNG PARENCHYMA. - THERE IS NO EVIDENCE OF MALIGNANCY.  Diagnosis 05/14/2015  FINE NEEDLE ASPIRATION: EBUS, 4R, C (SPECIMEN 3 OF , COLLECTED ON  05/14/15): MALIGNANT CELLS PRESENT, CONSISTENT WITH NON SMALL CELL CARCINOMA. SEE COMMENT. COMMENT: THERE IS INSUFFICIENT MATERIAL PRESENT FOR ADDITIONAL STUDIES.  Diagnosis 06/05/2015 Lung, needle/core biopsy(ies), right upper lobe - POORLY DIFFERENTIATED NON SMALL CELL CARCINOMA WITH ASSOCIATED NECROSIS. - SEE COMMENT. Microscopic Comment In lieu of  immunohistochemical stains, tissue will be preserved for additional studies, if requested. Dr. Burr Medico was paged on 06/08/15. (JBK:gt, 06/08/15)  ADDITIONAL INFORMATION: The tumor cells are positive for cyto5/5/2017keratin 5/6, suggesting squamous cell carcinoma. Per clinician request, a block will be sent for PDL-1 testing and results reported separately. (JBK:gt, 06/09/15)   RADIOGRAPHIC STUDIES: I have personally reviewed the radiological images as listed and agreed with the findings in the report.  PET 06/10/2015 IMPRESSION: 1. Hypermetabolic 2.7 cm peripheral right upper lobe pulmonary nodule, consistent with primary bronchogenic carcinoma. 2. Ipsilateral peribronchial, ipsilateral mediastinal and right level 4 neck nodal metastases. 3. Asymmetric foci of hypermetabolism in the left inferior frontal lobe and right cerebellar hemisphere, cannot exclude intra-axial brain metastases in these locations in this patient with known brain metastases. 4. Hypermetabolic thyroid isthmus nodule, for which a significant minority will represent thyroid malignancy. Correlate with thyroid ultrasound and ultrasound-guided fine-needle aspiration as clinically warranted. 5. No hypermetabolic metastatic disease in the abdomen, pelvis or skeleton. 6. Coronary atherosclerosis.  CT chest w contrast 11/26/2015 IMPRESSION: 1. Again noted significant mediastinal and right hilar adenopathy. Stable spiculated mass in right upper lobe. Progression in size of a second lesion in right upper lobe centrally measures 2 cm. 2. Mild emphysematous changes again noted.  No infiltrate or pulmonary edema. 3. There is a low-density lesion in posterior aspect of the left hepatic lobe measures 2.7 cm suspicious for metastatic disease. No aortic aneurysm or dissection. No central pulmonary embolus.    ASSESSMENT & PLAN:  A 65 year old female with past medical history of hypertension, 15 pack year history of smoking, presented with seizure like activity, images studies reviewed multiple (at least 6) brain lesions and a 2.3 cm right upper lobe lung nodule, with hilar and mediastinal adenopathy  1. 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  - her repeated Lung biopsy showed poor differentiated squamous cell carcinoma,  PET scan showed hypermetabolic hilar, mediastinum, and  Right neck level IV nodal metastasis. -She unfortunately developed new brain metastasis, had second SBRT on August 9.. - I reviewed the natural history and incurable nature of her metastatic squamous cell lung cancer,  Her overall prognosis is very poor giving the metastatic disease. - The goal of therapy is palliative and prolong her life. -Her tumor PD-L1<1%, unfortunately first line immunotherapy is not indicated. -She is currently on first-line chemotherapy with [place] and Abraxane, which was held lately due to her SBRT  -I reviewed her CT chest from 11/26/2015, which was obtained during her ED visit, showed disease progression in her lung and new liver metastasis. She is scheduled for a PET scan next week. -I'll hold on chemotherapy today due to disease progression. We discussed second line options. Due to her deteriorating performance status, I recommend Nivolumab as second line treatment. Her tumor was negative for PD-L1, so response rate is likely to be low. -Due to her poor performance status, she is not a good candidate for cytotoxic  chemotherapy. We'll also discussed the other option of palliative care alone, she is not ready for hospice. 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 She probably has a component of iron deficient anemia also -she  received iv feraheme in early July, responded well -she has required blood transfusion. Hemoglobin significantly improved after blood transfusion 2 weeks ago   3. HTN, COPD -she will continue follow up with her PCP  -she uses albuterol as needed  4. Peripheral neuropathy, G1 -Secondary to chemotherapy, mild  -We'll continue monitoring  5. Depression and insomnia  -She has developed depression symptoms lately -I recommended her to start mirtazapine 15 mg at bedtime, for depression, anorexia and insomnia. She started on 09/20/15, but did not refill. I refilled for her today and will increase to 30 mg in a few weeks  -Continue Ambien as needed  6. Hypercalcemia -she has developed hypercalcemia since her cancer diagnosis, likely related to her malignancy -I told her to stop vitamin D, and drink more water -Her corrected calcium was around 12, she received Zometa, calcium has improved, 10 today -she has a broken teeth for 6 months, no symptoms, I encourage her to have dental appointment soon   7. Neck pain -Probably muscular pain, I give her a prescription of tramadol  8. Epigastric discomfort -she will try nexium   9. Hemoptysis  -secondary to her lung cancer -if she has recurrent hemoptysis, we will consider palliative radiation  10 Goal of therapy and code status  -We discussed her cancer is not curable at this stage, and the goal of therapy is palliative. She voiced good understanding. Her granddaughter is still in denial, states "we believe in  god and she will be beat this cancer". -A Lengthy Discussion, She Agrees DNR/DNI   Plan -We'll hold chemotherapy today, due to disease progression -She is scheduled for PET scan next week  -we'll start her on second line treatment Nivolumab next week  All questions were answered. The patient knows to call the clinic with any problems, questions or concerns.    Truitt Merle, MD 11/27/15

## 2015-11-27 NOTE — Telephone Encounter (Signed)
Per LOS I have schedule appts and notified the scheduler

## 2015-11-29 ENCOUNTER — Encounter: Payer: Self-pay | Admitting: Hematology

## 2015-11-29 NOTE — Progress Notes (Signed)
START ON PATHWAY REGIMEN - Non-Small Cell Lung  [id number]: Nivolumab 240 mg q14 Days Until Progression or Unacceptable Toxicity   A cycle is every 14 days:     Nivolumab (Opdivo(R)) 240 mg flat dose in 100 mL NS IV over 60 minutes. Inline filter required (low protein binding) Dose Mod: None Additional Orders: Severe immune-mediated reactions can occur (e.g. pneumonitis, colitis, and hepatitis). See prescribing information for more details including monitoring and required immediate management with steroids. Monitor thyroid, renal, liver  function tests, glucose, and sodium at baseline and periodically during therapy.  **Always confirm dose/schedule in your pharmacy ordering system**    Patient Characteristics: Stage IV Metastatic, Squamous, PS = 2, Second Line, No Prior PD-1/PD-L1  Inhibitor and Immunotherapy Candidate AJCC M Stage: 1 AJCC N Stage: 3 AJCC T Stage: 1a Current Disease Status: Distant Metastases AJCC Stage Grouping: IV Histology: Squamous Cell Line of therapy: Second Line PD-L1 Expression Status: PD-L1 Negative Performance Status: PS = 2 Would you be surprised if this patient died  in the next year? I would NOT be surprised if this patient died in the next year Immunotherapy Candidate Status: Candidate for Immunotherapy Prior Immunotherapy Status: No Prior PD-1/PD-L1 Inhibitor  Intent of Therapy: Non-Curative / Palliative Intent, Discussed with Patient

## 2015-11-30 ENCOUNTER — Ambulatory Visit (HOSPITAL_COMMUNITY): Payer: BLUE CROSS/BLUE SHIELD

## 2015-12-01 ENCOUNTER — Telehealth: Payer: Self-pay | Admitting: *Deleted

## 2015-12-01 NOTE — Telephone Encounter (Signed)
"  Calling to confirm an appointment for PET scan today." MRI brain scheduled 12-04-2015 after lab and MD F/U.  Provided radiology central scheduling number to call about PET scan that is not yet scheduled.

## 2015-12-01 NOTE — Progress Notes (Signed)
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 Local edema without significant mass effect.      05/11/2015 Imaging    CT chest, abdomen and pelvis with contrast showed right upper lobe nodule 1.7 cm, with right hilar, bilateral mediastinal, and the thoracic inlet adenopathy.      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 The biopsy or over the right upper lobe lung nodule was negative.      05/22/2015 - 05/27/2015 Radiation Therapy    SRS to brain metastases       06/05/2015 Miscellaneous    PD-L1 IHC <1%       06/05/2015 Pathology Results    Right upper lobe lung nodule core needle biopsy showed poorly differentiated non-small cell lung cancer with associated necrosis, tumor cells are positive for cytokeratin 5/6, suggesting squamous cell carcinoma.      06/05/2015 Procedure    IR RUL lung nodule biopsy       06/19/2015 -  Chemotherapy    Carboplatin AUC 5 on D1,  and abraxane 164m/m2 on Day 1, 8 and 15, every 28 days       09/09/2015 -  Radiation Therapy    Pt received SBRT to her new brain mets       History of present illness  (05/12/2015)  Patient is a 65year old female with past medical history of hypertension, 30 year smoking history, remote history of breast cancer in 1992, presented with sudden onset episodes of difficulty speaking, drooling, and facial twitching on 09/09/2015. It lasted about 7-8 minutes. EMS was called and she was brought to the [name]. Brain scan showed multiple brain lesions, highly suspicious for metastatic disease. CT chest abdomen and pelvis revealed a 2.3 cm right upper lobe lung nodule, along was hilar and mediastinal adenopathy. Pulmonary was consulted for biopsy. I saw her in the [name], along with 2 of her granddaughters. She feels well now, denies any significant pain, dyspnea, or other symptoms. She does have mild chronic cough nonproductive. She states that her appetite has been low and she lost about 10-15 pounds in the past year. 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 She has 2 sisters and graduations in the area.  CURRENT THERAPY: Carboplatin AUC 5 on day 1, Abraxane 100 mg/m on day 1, 8 and 15, every 28 days, started on 06/19/2015, changed to carbo AUC 5 and abraxane 780mm2 since cycle 3 due to cytopenia, cycle 4 was postponed due to brain radiation   INTERVAL HISTORY:  [name] for follow-up and chemo. She lives with her granddaughter. She stays at home alone while  her granddaughter goes to school and work. She is not eating well, only about 2 meals and 2 Ensure daily. She has lost about 5 lbs in the last week. She is able to move around at home. She has experienced some hemoptysis. She reports memory loss since brain radiation treatment. She reports intermittent lower stomach pain.   REVIEW OF SYSTEMS:   Constitutional: Denies fevers, chills, (+) fatigue, poor appetite, and weight loss  Eyes: Denies blurriness of vision Ears, nose, mouth, throat, and face: Denies mucositis or sore throat Respiratory: Denies cough, dyspnea or wheezes, (+)  hemoptysis Cardiovascular: Denies palpitation, chest discomfort or lower extremity swelling Gastrointestinal:  Denies nausea, heartburn or change in bowel habits (+) intermittent lower abdominal pain Skin: Denies abnormal skin rashes Lymphatics: Denies new lymphadenopathy or easy bruising Neurological:Denies numbness, tingling or new weaknesses Behavioral/Psych: Mood is stable, no new changes  All other systems were reviewed with the patient and are negative.  MEDICAL HISTORY:  Past Medical History:  Diagnosis Date  . Asthmatic bronchitis   . Brain cancer ([place])   . Breast cancer ([place])   . Chronic bronchitis ([place])   . Family history of adverse reaction to anesthesia    "my son passed away I think related to the anesthesia"  . Hyperlipidemia   . Hypertension   . Seizures ([place]) 05/10/2015   "it may have been related to me taking Cipro" (05/11/2015)  . Sinus headache     SURGICAL HISTORY: Past Surgical History:  Procedure Laterality Date  . ABDOMINAL HYSTERECTOMY    . APPENDECTOMY    . BREAST BIOPSY Right   . BREAST LUMPECTOMY Right   . CATARACT EXTRACTION Right 08/26/2015  . LAPAROSCOPIC CHOLECYSTECTOMY    . VIDEO BRONCHOSCOPY WITH ENDOBRONCHIAL NAVIGATION N/A 05/14/2015   Procedure: VIDEO BRONCHOSCOPY WITH ENDOBRONCHIAL NAVIGATION;  Surgeon: Rose Gobble, MD;  Location: [facility];  Service: Thoracic;  Laterality: N/A;  . VIDEO BRONCHOSCOPY WITH ENDOBRONCHIAL ULTRASOUND N/A 05/14/2015   Procedure: VIDEO BRONCHOSCOPY WITH ENDOBRONCHIAL ULTRASOUND;  Surgeon: Rose Gob[facility], MD;  Location: [facility];  Service: Thoracic;  Laterality: N/A;    ALLERGIES:  has No Known Allergies.  MEDICATIONS:  Current Outpatient Prescriptions  Medication Sig Dispense Refill  . albuterol (PROVENTIL) (2.5 MG/3ML) 0.083% nebulizer solution Take 2.5 mg by nebulization every 6 (six) hours as needed for wheezing or shortness of breath. Not sure of concentration    . aspirin EC 81 MG tablet Take 81 mg by mouth  every morning.     . cholecalciferol (VITAMIN D) 1000 units tablet Take 1 tablet (1,000 Units total) by mouth daily. (Patient not taking: Reported on 11/26/2015) 180 tablet 6  . doxycycline (VIBRAMYCIN) 100 MG capsule Take 1 capsule (100 mg total) by mouth 2 (two) times daily. One po bid x 7 days (Patient not taking: Reported on 11/26/2015) 14 capsule 0  . esomeprazole (NEXIUM) 20 MG packet Take 20 mg by mouth daily before breakfast. (Patient not taking: Reported on 11/26/2015) 30 each 1  . ferrous sulfate 325 (65 FE) MG tablet Take 325 mg by mouth 2 (two) times daily.    Marland Kitchen HYDROcodone-acetaminophen (NORCO) 5-325 MG tablet Take 1 tablet by mouth every 6 (six) hours as needed for severe pain. 20 tablet 0  . lactobacillus acidophilus (BACID) TABS tablet Take 1 tablet by mouth every morning.     . levETIRAcetam (KEPPRA) 100 MG/ML solution Take 5 mLs (500 mg total) by mouth 2 (two) times daily. 473 mL 11  . megestrol (  MEGACE ES) 625 MG/5ML suspension Take 5 mLs (625 mg total) by mouth daily. 150 mL 0  . mirtazapine (REMERON) 15 MG tablet Take 1-2 tablets (15-30 mg total) by mouth at bedtime. Take one tab before bed time, for 2 weeks, if tolerates well, increase to 2 tab at bed time daily 60 tablet 1  . ondansetron (ZOFRAN) 8 MG tablet Take 1 tablet (8 mg total) by mouth every 8 (eight) hours as needed for nausea or vomiting. 30 tablet 1  . prochlorperazine (COMPAZINE) 10 MG tablet Take 1 tablet (10 mg total) by mouth every 6 (six) hours as needed (Nausea or vomiting). 30 tablet 1  . zolpidem (AMBIEN) 5 MG tablet Take 1 tablet (5 mg total) by mouth at bedtime as needed for sleep. 30 tablet 0   No current facility-administered medications for this visit.    Facility-Administered Medications Ordered in Other Visits  Medication Dose Route Frequency Provider Last Rate Last Dose  . sodium chloride 0.9 % injection 10 mL  10 mL Intracatheter PRN Rose Merle, MD        PHYSICAL EXAMINATION: ECOG PERFORMANCE  STATUS: 2-3 BP 136/77 (BP Location: Left Arm, Patient Position: Sitting)   Pulse (!) 106 Comment: Made the nurse aware of the heart rate  Temp 97.8 F (36.6 C) (Oral)   Resp 17   Ht _0  (1.549 m)   Wt 97 lb 1.6 oz (44 kg)   SpO2 100%   BMI 18.35 kg/m   GENERAL:alert, no distress and comfortable, in wheelchair SKIN: skin color, texture, turgor are normal, no rashes or significant lesions EYES: normal, Conjunctiva are pink and non-injected, sclera clear OROPHARYNX:no exudate, no erythema and lips, buccal mucosa, and tongue normal  NECK: supple, thyroid normal size, non-tender, without nodularity LYMPH:  no palpable lymphadenopathy in the cervical, axillary or inguinal LUNGS: clear to auscultation and percussion with normal breathing effort HEART: regular rate & rhythm and no murmurs and no lower extremity edema ABDOMEN:abdomen soft, non-tender and normal bowel sounds Musculoskeletal:no cyanosis of digits and no clubbing  NEURO: alert & oriented x 3 with fluent speech, no focal motor/sensory deficits   LABORATORY DATA:  I have reviewed the data as listed CBC Latest Ref Rng & Units 12/04/2015 11/26/2015 11/19/2015  WBC 3.9 - 10.3 10e3/uL 7.3 5.5 4.4  Hemoglobin 11.6 - 15.9 g/dL 12.6 12.5 7.9(L)  Hematocrit 34.8 - 46.6 % 39.6 38.7 25.0(L)  Platelets 145 - 400 10e3/uL 446(H) 603(H) 509(H)     CMP Latest Ref Rng & Units 12/04/2015 11/26/2015 11/19/2015  Glucose 70 - 140 mg/dl 121 109(H) 109  BUN 7.0 - 26.0 mg/dL 8.7 9 6.3(L)  Creatinine 0.6 - 1.1 mg/dL 0.6 0.42(L) 0.6  Sodium 136 - 145 mEq/L 139 137 137  Potassium 3.5 - 5.1 mEq/L 3.7 3.6 3.7  Chloride 101 - 111 mmol/L - 100(L) -  CO2 22 - 29 mEq/L _1 Calcium 8.4 - 10.4 mg/dL 10.3 10.0 10.6(H)  Total Protein 6.4 - 8.3 g/dL 8.9(H) 8.7(H) 8.7(H)  Total Bilirubin 0.20 - 1.20 mg/dL 0.64 0.5 0.31  Alkaline Phos 40 - 150 U/L 146 104 120  AST 5 - 34 U/L _2 ALT 0 - 55 U/L 10 13(L) 7   PATHOLOGY REPORT Diagnosis 05/14/2015   Lung, biopsy, Right upper lobe - MILDLY INFLAMED LUNG PARENCHYMA. - THERE IS NO EVIDENCE OF MALIGNANCY.  Diagnosis 05/14/2015  FINE NEEDLE ASPIRATION: EBUS, 4R, C (SPECIMEN 3 OF , COLLECTED ON 05/14/15): MALIGNANT CELLS  PRESENT, CONSISTENT WITH NON SMALL CELL CARCINOMA. SEE COMMENT. COMMENT: THERE IS INSUFFICIENT MATERIAL PRESENT FOR ADDITIONAL STUDIES.  Diagnosis 06/05/2015 Lung, needle/core biopsy(ies), right upper lobe - POORLY DIFFERENTIATED NON SMALL CELL CARCINOMA WITH ASSOCIATED NECROSIS. - SEE COMMENT. Microscopic Comment In lieu of immunohistochemical stains, tissue will be preserved for additional studies, if requested. Dr. Burr Medico was paged on 06/08/15. (JBK:gt, 06/08/15)  ADDITIONAL INFORMATION: The tumor cells are positive for cyto5/5/2017keratin 5/6, suggesting squamous cell carcinoma. Per clinician request, a block will be sent for PDL-1 testing and results reported separately. (JBK:gt, 06/09/15)   RADIOGRAPHIC STUDIES: I have personally reviewed the radiological images as listed and agreed with the findings in the report.  PET 06/10/2015 IMPRESSION: 1. Hypermetabolic 2.7 cm peripheral right upper lobe pulmonary nodule, consistent with primary bronchogenic carcinoma. 2. Ipsilateral peribronchial, ipsilateral mediastinal and right level 4 neck nodal metastases. 3. Asymmetric foci of hypermetabolism in the left inferior frontal lobe and right cerebellar hemisphere, cannot exclude intra-axial brain metastases in these locations in this patient with known brain metastases. 4. Hypermetabolic thyroid isthmus nodule, for which a significant minority will represent thyroid malignancy. Correlate with thyroid ultrasound and ultrasound-guided fine-needle aspiration as clinically warranted. 5. No hypermetabolic metastatic disease in the abdomen, pelvis or skeleton. 6. Coronary atherosclerosis.  CT chest w contrast 11/26/2015 IMPRESSION: 1. Again noted significant mediastinal  and right hilar adenopathy. Stable spiculated mass in right upper lobe. Progression in size of a second lesion in right upper lobe centrally measures 2 cm. 2. Mild emphysematous changes again noted. No infiltrate or pulmonary edema. 3. There is a low-density lesion in posterior aspect of the left hepatic lobe measures 2.7 cm suspicious for metastatic disease. No aortic aneurysm or dissection. No central pulmonary embolus.    ASSESSMENT & PLAN:  A 65 year old female with past medical history of hypertension, 15 pack year history of smoking, presented with seizure like activity, images studies reviewed multiple (at least 6) brain lesions and a 2.3 cm right upper lobe lung nodule, with hilar and mediastinal adenopathy  1. 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  - her repeated Lung biopsy showed poor differentiated squamous cell carcinoma,  PET scan showed hypermetabolic hilar, mediastinum, and  Right neck level IV nodal metastasis. -She unfortunately developed new brain metastasis, had second SBRT on August 9. - I reviewed the natural history and incurable nature of her metastatic squamous cell lung cancer,  Her overall prognosis is very poor giving the metastatic disease. - The goal of therapy is palliative and prolong her life. -Her tumor PD-L1<1%, unfortunately first line immunotherapy is not indicated. -She is currently on first-line chemotherapy with [place] and Abraxane, which was held lately due to her SBRT  -I reviewed her CT chest from 11/26/2015, which was obtained during her ED visit, showed disease progression in her lung and new liver metastasis. She is scheduled for a PET scan next week. -Due to her deteriorating performance status, I recommend Nivolumab as second line treatment. Her tumor was negative for PD-L1, so response  rate is likely to be low.  -Due to her poor performance status, she is not a good candidate for cytotoxic chemotherapy. We'll also discussed the other option of palliative care alone, she is not ready for hospice. 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 She probably has a component of iron deficient anemia also -she received iv feraheme in early July, responded well -she has required blood transfusion. Hemoglobin significantly improved after blood transfusion. HGB 12.6 today.  3. HTN, COPD -she will continue follow up with her PCP  -she uses albuterol as needed  4. Peripheral neuropathy, G1 -Secondary to chemotherapy, mild  -We'll continue monitoring  5. Depression and insomnia  -She has developed depression symptoms lately -I recommended her to start mirtazapine 15 mg at bedtime, for depression, anorexia and insomnia. She started on 09/20/15, but did not refill. I refilled for her last week and will increase to 30 mg in a few weeks  -Continue Ambien as needed  6. Hypercalcemia -she has developed hypercalcemia since her cancer diagnosis, likely related to her malignancy -I told her to stop vitamin D, and drink more water -Her corrected calcium was around 12, she received Zometa, calcium has improved, 10.3 today -she has a broken teeth for 6 months, no symptoms, I encourage her to have dental appointment soon   7. Lower abdominal pain -I gave her a prescription of hydrocodone  8. Epigastric discomfort -she will try nexium   9. Hemoptysis  -secondary to her lung cancer -if she has recurrent hemoptysis, we will consider palliative radiation  10 Goal of therapy and code status  -We discussed her cancer is not curable at this stage, and the  goal of therapy is palliative. She voiced good understanding. Her granddaughter is still in denial, states "we believe in god and she will be beat this cancer". -A Lengthy Discussion, She Agrees DNR/DNI   11. Weight loss -She has lost 5 lbs in the last week -I have encouraged her to increase Boost/Ensure intake along with snacks between meals -I have prescribed megace to stimulate appetite  Plan -She is scheduled for MR Brain later today -She is scheduled for PET scan 12/09/15 -I have prescribed megace and hydrocodone today -we'll start her on second line treatment Nivolumab today after Brain MRI -She will follow up with radiation oncologist Dr. Lisbeth Renshaw on 12/07/15 -She will return for follow up in our clinic in 2 weeks  All questions were answered. The patient knows to call the clinic with any problems, questions or concerns.  This document serves as a record of services personally performed by Rose Merle, MD. It was created on her behalf by Arlyce [facility], a trained medical scribe. The creation of this record is based on the scribe's personal observations and the provider's statements to them. This document has been checked and approved by the attending provider.   Rose Merle, MD 12/04/15   Addendum,  Her MRI findings were noticed tonight, she unfortunately had a significant progression of her brain metastasis. I will call her and call in dexa 20m bid tomorrow.  FTruitt [name] 12/04/2015 11:00 PM

## 2015-12-04 ENCOUNTER — Ambulatory Visit
Admission: RE | Admit: 2015-12-04 | Discharge: 2015-12-04 | Disposition: A | Discharge status: Home / Self Care | Payer: BLUE CROSS/BLUE SHIELD | Source: Ambulatory Visit | Attending: Radiation Oncology | Admitting: Radiation Oncology

## 2015-12-04 ENCOUNTER — Other Ambulatory Visit (HOSPITAL_BASED_OUTPATIENT_CLINIC_OR_DEPARTMENT_OTHER): Payer: Medicare Other

## 2015-12-04 ENCOUNTER — Ambulatory Visit (HOSPITAL_BASED_OUTPATIENT_CLINIC_OR_DEPARTMENT_OTHER): Payer: Medicare Other | Admitting: Hematology

## 2015-12-04 ENCOUNTER — Ambulatory Visit (HOSPITAL_BASED_OUTPATIENT_CLINIC_OR_DEPARTMENT_OTHER): Payer: Medicare Other

## 2015-12-04 VITALS — HR 104

## 2015-12-04 VITALS — BP 136/77 | HR 106 | Temp 97.8°F | Resp 17 | Ht 61.0 in | Wt 97.1 lb

## 2015-12-04 DIAGNOSIS — C3411 Malignant neoplasm of upper lobe, right bronchus or lung: Secondary | ICD-10-CM

## 2015-12-04 DIAGNOSIS — Z95828 Presence of other vascular implants and grafts: Secondary | ICD-10-CM

## 2015-12-04 DIAGNOSIS — R042 Hemoptysis: Secondary | ICD-10-CM

## 2015-12-04 DIAGNOSIS — I10 Essential (primary) hypertension: Secondary | ICD-10-CM | POA: Diagnosis not present

## 2015-12-04 DIAGNOSIS — F32A Depression, unspecified: Secondary | ICD-10-CM

## 2015-12-04 DIAGNOSIS — R1030 Lower abdominal pain, unspecified: Secondary | ICD-10-CM

## 2015-12-04 DIAGNOSIS — R109 Unspecified abdominal pain: Secondary | ICD-10-CM | POA: Diagnosis not present

## 2015-12-04 DIAGNOSIS — G620 Drug-induced polyneuropathy: Secondary | ICD-10-CM | POA: Diagnosis not present

## 2015-12-04 DIAGNOSIS — Z66 Do not resuscitate: Secondary | ICD-10-CM | POA: Diagnosis not present

## 2015-12-04 DIAGNOSIS — C7931 Secondary malignant neoplasm of brain: Secondary | ICD-10-CM

## 2015-12-04 DIAGNOSIS — D509 Iron deficiency anemia, unspecified: Secondary | ICD-10-CM

## 2015-12-04 DIAGNOSIS — F329 Major depressive disorder, single episode, unspecified: Secondary | ICD-10-CM

## 2015-12-04 DIAGNOSIS — C779 Secondary and unspecified malignant neoplasm of lymph node, unspecified: Secondary | ICD-10-CM

## 2015-12-04 DIAGNOSIS — C3491 Malignant neoplasm of unspecified part of right bronchus or lung: Secondary | ICD-10-CM

## 2015-12-04 DIAGNOSIS — D630 Anemia in neoplastic disease: Secondary | ICD-10-CM

## 2015-12-04 DIAGNOSIS — Z7189 Other specified counseling: Secondary | ICD-10-CM

## 2015-12-04 DIAGNOSIS — C778 Secondary and unspecified malignant neoplasm of lymph nodes of multiple regions: Secondary | ICD-10-CM | POA: Diagnosis not present

## 2015-12-04 DIAGNOSIS — J449 Chronic obstructive pulmonary disease, unspecified: Secondary | ICD-10-CM | POA: Diagnosis not present

## 2015-12-04 DIAGNOSIS — R634 Abnormal weight loss: Secondary | ICD-10-CM

## 2015-12-04 DIAGNOSIS — C7949 Secondary malignant neoplasm of other parts of nervous system: Principal | ICD-10-CM

## 2015-12-04 DIAGNOSIS — G4700 Insomnia, unspecified: Secondary | ICD-10-CM | POA: Diagnosis not present

## 2015-12-04 LAB — COMPREHENSIVE METABOLIC PANEL
ALT: 10 U/L (ref 0–55)
ANION GAP: 14 meq/L — AB (ref 3–11)
AST: 9 U/L (ref 5–34)
Albumin: 2.6 g/dL — ABNORMAL LOW (ref 3.5–5.0)
Alkaline Phosphatase: 146 U/L (ref 40–150)
BUN: 8.7 mg/dL (ref 7.0–26.0)
CALCIUM: 10.3 mg/dL (ref 8.4–10.4)
CHLORIDE: 102 meq/L (ref 98–109)
CO2: 24 mEq/L (ref 22–29)
Creatinine: 0.6 mg/dL (ref 0.6–1.1)
Glucose: 121 mg/dl (ref 70–140)
POTASSIUM: 3.7 meq/L (ref 3.5–5.1)
Sodium: 139 mEq/L (ref 136–145)
Total Bilirubin: 0.64 mg/dL (ref 0.20–1.20)
Total Protein: 8.9 g/dL — ABNORMAL HIGH (ref 6.4–8.3)

## 2015-12-04 LAB — CBC WITH DIFFERENTIAL/PLATELET
BASO%: 0.6 % (ref 0.0–2.0)
BASOS ABS: 0 10*3/uL (ref 0.0–0.1)
EOS%: 0.1 % (ref 0.0–7.0)
Eosinophils Absolute: 0 10*3/uL (ref 0.0–0.5)
HEMATOCRIT: 39.6 % (ref 34.8–46.6)
HGB: 12.6 g/dL (ref 11.6–15.9)
LYMPH#: 1 10*3/uL (ref 0.9–3.3)
LYMPH%: 14 % (ref 14.0–49.7)
MCH: 27.6 pg (ref 25.1–34.0)
MCHC: 31.9 g/dL (ref 31.5–36.0)
MCV: 86.6 fL (ref 79.5–101.0)
MONO#: 0.9 10*3/uL (ref 0.1–0.9)
MONO%: 11.8 % (ref 0.0–14.0)
NEUT#: 5.4 10*3/uL (ref 1.5–6.5)
NEUT%: 73.5 % (ref 38.4–76.8)
Platelets: 446 10*3/uL — ABNORMAL HIGH (ref 145–400)
RBC: 4.57 10*6/uL (ref 3.70–5.45)
RDW: 17.4 % — ABNORMAL HIGH (ref 11.2–14.5)
WBC: 7.3 10*3/uL (ref 3.9–10.3)

## 2015-12-04 MED ORDER — HYDROCODONE-ACETAMINOPHEN 5-325 MG PO TABS: 1.0000 | ORAL_TABLET | Freq: Four times a day (QID) | ORAL | 0 refills | Status: DC | PRN

## 2015-12-04 MED ORDER — SODIUM CHLORIDE 0.9 % IJ SOLN
3.0000 mL | Freq: Once | INTRAMUSCULAR | Status: DC | PRN
  Filled 2015-12-04: qty 10

## 2015-12-04 MED ORDER — SODIUM CHLORIDE 0.9 % IV SOLN
Freq: Once | INTRAVENOUS | Status: AC
Start: 2015-12-04 — End: 2015-12-04
  Administered 2015-12-04: 16:00:00 via INTRAVENOUS

## 2015-12-04 MED ORDER — SODIUM CHLORIDE 0.9 % IV SOLN
240.0000 mg | Freq: Once | INTRAVENOUS | Status: AC
  Administered 2015-12-04: 240 mg via INTRAVENOUS
  Filled 2015-12-04: qty 20

## 2015-12-04 MED ORDER — SODIUM CHLORIDE 0.9% FLUSH
10.0000 mL | INTRAVENOUS | Status: DC | PRN
  Filled 2015-12-04: qty 10

## 2015-12-04 MED ORDER — SODIUM CHLORIDE 0.9 % IV SOLN
Freq: Once | INTRAVENOUS | Status: AC
  Administered 2015-12-04: 14:00:00 via INTRAVENOUS

## 2015-12-04 MED ORDER — ALTEPLASE 2 MG IJ SOLR
2.0000 mg | Freq: Once | INTRAMUSCULAR | Status: DC | PRN
  Filled 2015-12-04: qty 2

## 2015-12-04 MED ORDER — DEXAMETHASONE 4 MG PO TABS: 4.0000 mg | ORAL_TABLET | Freq: Two times a day (BID) | ORAL | 0 refills | Status: AC

## 2015-12-04 MED ORDER — SODIUM CHLORIDE 0.9 % IJ SOLN
10.0000 mL | INTRAMUSCULAR | Status: DC | PRN
  Filled 2015-12-04: qty 10

## 2015-12-04 MED ORDER — HEPARIN SOD (PORK) LOCK FLUSH 100 UNIT/ML IV SOLN
500.0000 [IU] | Freq: Once | INTRAVENOUS | Status: DC | PRN
  Filled 2015-12-04: qty 5

## 2015-12-04 MED ORDER — HEPARIN SOD (PORK) LOCK FLUSH 100 UNIT/ML IV SOLN
500.0000 [IU] | Freq: Once | INTRAVENOUS | Status: DC
  Filled 2015-12-04: qty 5

## 2015-12-04 MED ORDER — MEGESTROL ACETATE 625 MG/5ML PO SUSP: 625.0000 mg | Freq: Every day | ORAL | 0 refills | Status: DC

## 2015-12-04 MED ORDER — ZOLEDRONIC ACID 4 MG/100ML IV SOLN
4.0000 mg | Freq: Once | INTRAVENOUS | Status: AC
  Administered 2015-12-04: 4 mg via INTRAVENOUS
  Filled 2015-12-04: qty 100

## 2015-12-04 MED ORDER — SODIUM CHLORIDE 0.9 % IV SOLN: Freq: Once | INTRAVENOUS | Status: DC

## 2015-12-04 MED ORDER — GADOBENATE DIMEGLUMINE 529 MG/ML IV SOLN
9.0000 mL | Freq: Once | INTRAVENOUS | Status: AC | PRN
  Administered 2015-12-04: 9 mL via INTRAVENOUS

## 2015-12-04 MED ORDER — HEPARIN SOD (PORK) LOCK FLUSH 100 UNIT/ML IV SOLN
250.0000 [IU] | Freq: Once | INTRAVENOUS | Status: DC | PRN
  Filled 2015-12-04: qty 5

## 2015-12-04 NOTE — Progress Notes (Signed)
Ok to treat with HR 104 per MD Burr Medico.

## 2015-12-04 NOTE — Patient Instructions (Addendum)
[facility] Discharge Instructions for Patients Receiving Chemotherapy  Today you received the following chemotherapy agents Opdivo   To help prevent nausea and vomiting after your treatment, we encourage you to take your nausea medication as directed.    If you develop nausea and vomiting that is not controlled by your nausea medication, call the clinic.   BELOW ARE SYMPTOMS THAT SHOULD BE REPORTED IMMEDIATELY:  *FEVER GREATER THAN 100.5 F  *CHILLS WITH OR WITHOUT FEVER  NAUSEA AND VOMITING THAT IS NOT CONTROLLED WITH YOUR NAUSEA MEDICATION  *UNUSUAL SHORTNESS OF BREATH  *UNUSUAL BRUISING OR BLEEDING  TENDERNESS IN MOUTH AND THROAT WITH OR WITHOUT PRESENCE OF ULCERS  *URINARY PROBLEMS  *BOWEL PROBLEMS  UNUSUAL RASH Items with * indicate a potential emergency and should be followed up as soon as possible.  Feel free to call the clinic you have any questions or concerns. The clinic phone number is [phone].  Please show the [place] at check-in to the Emergency Department and triage nurse.  Nivolumab injection What is this medicine? NIVOLUMAB (nye VOL ue mab) is a monoclonal antibody. It is used to treat melanoma, lung cancer, kidney cancer, and Hodgkin lymphoma. This medicine may be used for other purposes; ask your health care provider or pharmacist if you have questions. What should I tell my health care provider before I take this medicine? They need to know if you have any of these conditions: -diabetes -immune system problems -kidney disease -liver disease -lung disease -organ transplant -stomach or intestine problems -thyroid disease -an unusual or allergic reaction to nivolumab, other medicines, foods, dyes, or preservatives -pregnant or trying to get pregnant -breast-feeding How should I use this medicine? This medicine is for infusion into a vein. It is given by a health care professional in a hospital or clinic setting. A  special MedGuide will be given to you before each treatment. Be sure to read this information carefully each time. Talk to your pediatrician regarding the use of this medicine in children. Special care may be needed. Overdosage: If you think you have taken too much of this medicine contact a poison control center or emergency room at once. NOTE: This medicine is only for you. Do not share this medicine with others. What if I miss a dose? It is important not to miss your dose. Call your doctor or health care professional if you are unable to keep an appointment. What may interact with this medicine? Interactions have not been studied. Give your health care provider a list of all the medicines, herbs, non-prescription drugs, or dietary supplements you use. Also tell them if you smoke, drink alcohol, or use illegal drugs. Some items may interact with your medicine. This list may not describe all possible interactions. Give your health care provider a list of all the medicines, herbs, non-prescription drugs, or dietary supplements you use. Also tell them if you smoke, drink alcohol, or use illegal drugs. Some items may interact with your medicine. What should I watch for while using this medicine? This drug may make you feel generally unwell. Continue your course of treatment even though you feel ill unless your doctor tells you to stop. You may need blood work done while you are taking this medicine. Do not become pregnant while taking this medicine or for 5 months after stopping it. Women should inform their doctor if they wish to become pregnant or think they might be pregnant. There is a potential for serious side effects to  an unborn child. Talk to your health care professional or pharmacist for more information. Do not breast-feed an infant while taking this medicine. What side effects may I notice from receiving this medicine? Side effects that you should report to your doctor or health care  professional as soon as possible: -allergic reactions like skin rash, itching or hives, swelling of the face, lips, or tongue -black, tarry stools -blood in the urine -bloody or watery diarrhea -changes in vision -change in sex drive -changes in emotions or moods -chest pain -confusion -cough -decreased appetite -diarrhea -facial flushing -feeling faint or lightheaded -fever, chills -hair loss -hallucination, loss of contact with reality -headache -irritable -joint pain -loss of memory -muscle pain -muscle weakness -seizures -shortness of breath -signs and symptoms of high blood sugar such as dizziness; dry mouth; dry skin; fruity breath; nausea; stomach pain; increased hunger or thirst; increased urination -signs and symptoms of kidney injury like trouble passing urine or change in the amount of urine -signs and symptoms of liver injury like dark yellow or brown urine; general ill feeling or flu-like symptoms; light-colored stools; loss of appetite; nausea; right upper belly pain; unusually weak or tired; yellowing of the eyes or skin -stiff neck -swelling of the ankles, feet, hands -weight gain Side effects that usually do not require medical attention (report to your doctor or health care professional if they continue or are bothersome): -bone pain -constipation -tiredness -vomiting This list may not describe all possible side effects. Call your doctor for medical advice about side effects. You may report side effects to FDA at 1-800-FDA-1088. Where should I keep my medicine? This drug is given in a hospital or clinic and will not be stored at home. NOTE: This sheet is a summary. It may not cover all possible information. If you have questions about this medicine, talk to your doctor, pharmacist, or health care provider.    2016, [employer] Standard. (2014-06-18 10:03:42) Zoledronic Acid injection (Hypercalcemia, Oncology) What is this medicine? ZOLEDRONIC ACID  (ZOE le dron ik AS id) lowers the amount of calcium loss from bone. It is used to treat too much calcium in your blood from cancer. It is also used to prevent complications of cancer that has spread to the bone. This medicine may be used for other purposes; ask your health care provider or pharmacist if you have questions. What should I tell my health care provider before I take this medicine? They need to know if you have any of these conditions: -aspirin-sensitive asthma -cancer, especially if you are receiving medicines used to treat cancer -dental disease or wear dentures -infection -kidney disease -receiving corticosteroids like dexamethasone or prednisone -an unusual or allergic reaction to zoledronic acid, other medicines, foods, dyes, or preservatives -pregnant or trying to get pregnant -breast-feeding How should I use this medicine? This medicine is for infusion into a vein. It is given by a health care professional in a hospital or clinic setting. Talk to your pediatrician regarding the use of this medicine in children. Special care may be needed. Overdosage: If you think you have taken too much of this medicine contact a poison control center or emergency room at once. NOTE: This medicine is only for you. Do not share this medicine with others. What if I miss a dose? It is important not to miss your dose. Call your doctor or health care professional if you are unable to keep an appointment. What may interact with this medicine? -certain antibiotics given by injection -NSAIDs, medicines for  pain and inflammation, like ibuprofen or naproxen -some diuretics like bumetanide, furosemide -teriparatide -thalidomide This list may not describe all possible interactions. Give your health care provider a list of all the medicines, herbs, non-prescription drugs, or dietary supplements you use. Also tell them if you smoke, drink alcohol, or use illegal drugs. Some items may interact with your  medicine. What should I watch for while using this medicine? Visit your doctor or health care professional for regular checkups. It may be some time before you see the benefit from this medicine. Do not stop taking your medicine unless your doctor tells you to. Your doctor may order blood tests or other tests to see how you are doing. Women should inform their doctor if they wish to become pregnant or think they might be pregnant. There is a potential for serious side effects to an unborn child. Talk to your health care professional or pharmacist for more information. You should make sure that you get enough calcium and vitamin D while you are taking this medicine. Discuss the foods you eat and the vitamins you take with your health care professional. Some people who take this medicine have severe bone, joint, and/or muscle pain. This medicine may also increase your risk for jaw problems or a broken thigh bone. Tell your doctor right away if you have severe pain in your jaw, bones, joints, or muscles. Tell your doctor if you have any pain that does not go away or that gets worse. Tell your dentist and dental surgeon that you are taking this medicine. You should not have major dental surgery while on this medicine. See your dentist to have a dental exam and fix any dental problems before starting this medicine. Take good care of your teeth while on this medicine. Make sure you see your dentist for regular follow-up appointments. What side effects may I notice from receiving this medicine? Side effects that you should report to your doctor or health care professional as soon as possible: -allergic reactions like skin rash, itching or hives, swelling of the face, lips, or tongue -anxiety, confusion, or depression -breathing problems -changes in vision -eye pain -feeling faint or lightheaded, falls -jaw pain, especially after dental work -mouth sores -muscle cramps, stiffness, or weakness -redness,  blistering, peeling or loosening of the skin, including inside the mouth -trouble passing urine or change in the amount of urine Side effects that usually do not require medical attention (report to your doctor or health care professional if they continue or are bothersome): -bone, joint, or muscle pain -constipation -diarrhea -fever -hair loss -irritation at site where injected -loss of appetite -nausea, vomiting -stomach upset -trouble sleeping -trouble swallowing -weak or tired This list may not describe all possible side effects. Call your doctor for medical advice about side effects. You may report side effects to FDA at 1-800-FDA-1088. Where should I keep my medicine? This drug is given in a hospital or clinic and will not be stored at home. NOTE: This sheet is a summary. It may not cover all possible information. If you have questions about this medicine, talk to your doctor, pharmacist, or health care provider.    2016, [employer] Standard. (2013-06-15 14:19:39)

## 2015-12-05 ENCOUNTER — Telehealth: Payer: Self-pay | Admitting: Hematology

## 2015-12-05 NOTE — Telephone Encounter (Signed)
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 I spoke with pt this morning, she denies headache or other new symptoms, but she has very poor memory and speech is very slow, difficult to complete a sentence. I called her emergency contact granddaughter Ms Chana Bode, she did not answer and I left a message, regarding pt's condition and MRI findings. I have called in dexa '4mg'$  bid, and I want her to start today. I will inform Dr. Lisbeth Renshaw, and I plan to see her back on Monday 11/6, to see if she would like to try WBRT, but I will try to see if she is agreeable with hospice. Her condition is very poor, and I think hospice is a better option for her. She is DNR/DNI. I have previously recommended her not going to ED, and we will try to manager her condition in our clinic, giving her terminal malignancy.  Truitt Merle  12/05/2015  9:47 AM

## 2015-12-07 ENCOUNTER — Telehealth: Payer: Self-pay | Admitting: *Deleted

## 2015-12-07 ENCOUNTER — Ambulatory Visit
Admission: RE | Admit: 2015-12-07 | Payer: BLUE CROSS/BLUE SHIELD | Source: Ambulatory Visit | Admitting: Radiation Oncology

## 2015-12-07 ENCOUNTER — Telehealth: Payer: Self-pay | Admitting: Radiation Oncology

## 2015-12-07 NOTE — Telephone Encounter (Signed)
Patient has not shown for 1345 appointment. Phoned her home. Spoke with patient through her sister, Claiborne Billings. Overheard patient verbalize that she "will not be coming for the appointment, doesn't want to reschedule and is going a different route." Informed Shona Simpson, PA-C of this findings. PA Dara Lords verbalized her intent to contact patient tomorrow and confirm understanding of her present state.

## 2015-12-07 NOTE — Telephone Encounter (Signed)
Chemo follow up call: Left message for pt to call office.

## 2015-12-08 ENCOUNTER — Telehealth: Payer: Self-pay | Admitting: Radiation Oncology

## 2015-12-08 NOTE — Telephone Encounter (Signed)
LM for the patient to call me back to discuss MRI results. Per nursing, when they called to reschedule her missed appointment, her sister answered and declined to reschedule, stating that the paitent is going a different route for treatment.

## 2015-12-09 ENCOUNTER — Ambulatory Visit (HOSPITAL_COMMUNITY): Payer: BLUE CROSS/BLUE SHIELD

## 2015-12-15 ENCOUNTER — Emergency Department (HOSPITAL_COMMUNITY): Payer: Medicare Other

## 2015-12-15 ENCOUNTER — Inpatient Hospital Stay (HOSPITAL_COMMUNITY)
Admission: EM | Admit: 2015-12-15 | Discharge: 2015-12-16 | DRG: 054 | Disposition: A | Discharge status: Hospice | Payer: Medicare Other | Attending: Internal Medicine | Admitting: Internal Medicine

## 2015-12-15 ENCOUNTER — Encounter (HOSPITAL_COMMUNITY): Payer: Self-pay

## 2015-12-15 ENCOUNTER — Other Ambulatory Visit: Payer: Self-pay

## 2015-12-15 ENCOUNTER — Other Ambulatory Visit (HOSPITAL_COMMUNITY): Payer: Self-pay

## 2015-12-15 DIAGNOSIS — Z85118 Personal history of other malignant neoplasm of bronchus and lung: Secondary | ICD-10-CM

## 2015-12-15 DIAGNOSIS — Z800 Family history of malignant neoplasm of digestive organs: Secondary | ICD-10-CM | POA: Diagnosis not present

## 2015-12-15 DIAGNOSIS — Z90710 Acquired absence of both cervix and uterus: Secondary | ICD-10-CM

## 2015-12-15 DIAGNOSIS — D509 Iron deficiency anemia, unspecified: Secondary | ICD-10-CM | POA: Diagnosis present

## 2015-12-15 DIAGNOSIS — J449 Chronic obstructive pulmonary disease, unspecified: Secondary | ICD-10-CM | POA: Diagnosis present

## 2015-12-15 DIAGNOSIS — G936 Cerebral edema: Secondary | ICD-10-CM | POA: Diagnosis present

## 2015-12-15 DIAGNOSIS — E875 Hyperkalemia: Secondary | ICD-10-CM | POA: Diagnosis present

## 2015-12-15 DIAGNOSIS — Z803 Family history of malignant neoplasm of breast: Secondary | ICD-10-CM | POA: Diagnosis not present

## 2015-12-15 DIAGNOSIS — Z7982 Long term (current) use of aspirin: Secondary | ICD-10-CM

## 2015-12-15 DIAGNOSIS — Z9889 Other specified postprocedural states: Secondary | ICD-10-CM

## 2015-12-15 DIAGNOSIS — E860 Dehydration: Secondary | ICD-10-CM | POA: Diagnosis present

## 2015-12-15 DIAGNOSIS — C3491 Malignant neoplasm of unspecified part of right bronchus or lung: Secondary | ICD-10-CM | POA: Diagnosis present

## 2015-12-15 DIAGNOSIS — C7931 Secondary malignant neoplasm of brain: Principal | ICD-10-CM | POA: Diagnosis present

## 2015-12-15 DIAGNOSIS — Z9841 Cataract extraction status, right eye: Secondary | ICD-10-CM

## 2015-12-15 DIAGNOSIS — Z79899 Other long term (current) drug therapy: Secondary | ICD-10-CM | POA: Diagnosis not present

## 2015-12-15 DIAGNOSIS — C3490 Malignant neoplasm of unspecified part of unspecified bronchus or lung: Secondary | ICD-10-CM

## 2015-12-15 DIAGNOSIS — R296 Repeated falls: Secondary | ICD-10-CM | POA: Diagnosis present

## 2015-12-15 DIAGNOSIS — R627 Adult failure to thrive: Secondary | ICD-10-CM

## 2015-12-15 DIAGNOSIS — Z833 Family history of diabetes mellitus: Secondary | ICD-10-CM

## 2015-12-15 DIAGNOSIS — Z853 Personal history of malignant neoplasm of breast: Secondary | ICD-10-CM

## 2015-12-15 DIAGNOSIS — R4182 Altered mental status, unspecified: Secondary | ICD-10-CM | POA: Diagnosis not present

## 2015-12-15 DIAGNOSIS — I10 Essential (primary) hypertension: Secondary | ICD-10-CM | POA: Diagnosis present

## 2015-12-15 DIAGNOSIS — Z66 Do not resuscitate: Secondary | ICD-10-CM | POA: Diagnosis present

## 2015-12-15 DIAGNOSIS — Z8249 Family history of ischemic heart disease and other diseases of the circulatory system: Secondary | ICD-10-CM

## 2015-12-15 DIAGNOSIS — Z9049 Acquired absence of other specified parts of digestive tract: Secondary | ICD-10-CM

## 2015-12-15 DIAGNOSIS — R404 Transient alteration of awareness: Secondary | ICD-10-CM

## 2015-12-15 DIAGNOSIS — Z681 Body mass index (BMI) 19 or less, adult: Secondary | ICD-10-CM

## 2015-12-15 DIAGNOSIS — D630 Anemia in neoplastic disease: Secondary | ICD-10-CM

## 2015-12-15 DIAGNOSIS — Z515 Encounter for palliative care: Secondary | ICD-10-CM | POA: Diagnosis present

## 2015-12-15 LAB — COMPREHENSIVE METABOLIC PANEL
ALK PHOS: 104 U/L (ref 38–126)
ALK PHOS: 85 U/L (ref 38–126)
ALT: 18 U/L (ref 14–54)
ALT: 14 U/L (ref 14–54)
AST: 29 U/L (ref 15–41)
AST: 15 U/L (ref 15–41)
Albumin: 3.2 g/dL — ABNORMAL LOW (ref 3.5–5.0)
Albumin: 2.7 g/dL — ABNORMAL LOW (ref 3.5–5.0)
Anion gap: 13 (ref 5–15)
Anion gap: 11 (ref 5–15)
BILIRUBIN TOTAL: 1.8 mg/dL — AB (ref 0.3–1.2)
BILIRUBIN TOTAL: 0.9 mg/dL (ref 0.3–1.2)
BUN: 19 mg/dL (ref 6–20)
BUN: 16 mg/dL (ref 6–20)
CALCIUM: 11 mg/dL — AB (ref 8.9–10.3)
CALCIUM: 9.8 mg/dL (ref 8.9–10.3)
CO2: 25 mmol/L (ref 22–32)
CO2: 21 mmol/L — ABNORMAL LOW (ref 22–32)
CREATININE: 0.63 mg/dL (ref 0.44–1.00)
CREATININE: 0.53 mg/dL (ref 0.44–1.00)
Chloride: 95 mmol/L — ABNORMAL LOW (ref 101–111)
Chloride: 102 mmol/L (ref 101–111)
GFR calc Af Amer: >60 mL/min (ref >60)
GFR calc Af Amer: >60 mL/min (ref >60)
Glucose, Bld: 87 mg/dL (ref 65–99)
Glucose, Bld: 87 mg/dL (ref 65–99)
POTASSIUM: 6.4 mmol/L — AB (ref 3.5–5.1)
Potassium: 4.7 mmol/L (ref 3.5–5.1)
Sodium: 133 mmol/L — ABNORMAL LOW (ref 135–145)
Sodium: 134 mmol/L — ABNORMAL LOW (ref 135–145)
TOTAL PROTEIN: 8.4 g/dL — AB (ref 6.5–8.1)
TOTAL PROTEIN: 6.5 g/dL (ref 6.5–8.1)

## 2015-12-15 LAB — RAPID URINE DRUG SCREEN, HOSP PERFORMED
Amphetamines: NOT DETECTED
BARBITURATES: NOT DETECTED
Benzodiazepines: NOT DETECTED
Cocaine: NOT DETECTED
Opiates: POSITIVE — AB
Tetrahydrocannabinol: NOT DETECTED

## 2015-12-15 LAB — I-STAT CHEM 8, ED
BUN: 31 mg/dL — ABNORMAL HIGH (ref 6–20)
CREATININE: 0.5 mg/dL (ref 0.44–1.00)
Calcium, Ion: 1.16 mmol/L (ref 1.15–1.40)
Chloride: 99 mmol/L — ABNORMAL LOW (ref 101–111)
GLUCOSE: 83 mg/dL (ref 65–99)
HCT: 51 % — ABNORMAL HIGH (ref 36.0–46.0)
HEMOGLOBIN: 17.3 g/dL — AB (ref 12.0–15.0)
POTASSIUM: 6.5 mmol/L — AB (ref 3.5–5.1)
Sodium: 131 mmol/L — ABNORMAL LOW (ref 135–145)
TCO2: 29 mmol/L (ref 0–100)

## 2015-12-15 LAB — AMMONIA: Ammonia: 11 umol/L (ref 9–35)

## 2015-12-15 LAB — CBC
HEMATOCRIT: 46.9 % — AB (ref 36.0–46.0)
HEMOGLOBIN: 15.8 g/dL — AB (ref 12.0–15.0)
MCH: 28.8 pg (ref 26.0–34.0)
MCHC: 33.7 g/dL (ref 30.0–36.0)
MCV: 85.4 fL (ref 78.0–100.0)
Platelets: 467 10*3/uL — ABNORMAL HIGH (ref 150–400)
RBC: 5.49 MIL/uL — ABNORMAL HIGH (ref 3.87–5.11)
RDW: 17.5 % — ABNORMAL HIGH (ref 11.5–15.5)
WBC: 11.5 10*3/uL — ABNORMAL HIGH (ref 4.0–10.5)

## 2015-12-15 LAB — I-STAT CG4 LACTIC ACID, ED
LACTIC ACID, VENOUS: 1.92 mmol/L — AB (ref 0.5–1.9)
LACTIC ACID, VENOUS: 1.56 mmol/L (ref 0.5–1.9)

## 2015-12-15 LAB — URINALYSIS, ROUTINE W REFLEX MICROSCOPIC
Bilirubin Urine: NEGATIVE
Glucose, UA: NEGATIVE mg/dL
Hgb urine dipstick: NEGATIVE
Ketones, ur: NEGATIVE mg/dL
LEUKOCYTES UA: NEGATIVE
Nitrite: NEGATIVE
PROTEIN: NEGATIVE mg/dL
Specific Gravity, Urine: 1.016 (ref 1.005–1.030)
pH: 7 (ref 5.0–8.0)

## 2015-12-15 LAB — CK: Total CK: 59 U/L (ref 38–234)

## 2015-12-15 LAB — DIFFERENTIAL
BASOS ABS: 0 10*3/uL (ref 0.0–0.1)
Basophils Relative: 0 %
EOS ABS: 0 10*3/uL (ref 0.0–0.7)
Eosinophils Relative: 0 %
LYMPHS ABS: 1.4 10*3/uL (ref 0.7–4.0)
Lymphocytes Relative: 12 %
MONOS PCT: 11 %
Monocytes Absolute: 1.2 10*3/uL — ABNORMAL HIGH (ref 0.1–1.0)
Neutro Abs: 8.8 10*3/uL — ABNORMAL HIGH (ref 1.7–7.7)
Neutrophils Relative %: 77 %

## 2015-12-15 LAB — APTT: APTT: 35 s (ref 24–36)

## 2015-12-15 LAB — I-STAT TROPONIN, ED: TROPONIN I, POC: 0 ng/mL (ref 0.00–0.08)

## 2015-12-15 LAB — BASIC METABOLIC PANEL
ANION GAP: 14 (ref 5–15)
BUN: 12 mg/dL (ref 6–20)
CHLORIDE: 102 mmol/L (ref 101–111)
CO2: 21 mmol/L — AB (ref 22–32)
Calcium: 10.1 mg/dL (ref 8.9–10.3)
Creatinine, Ser: 0.61 mg/dL (ref 0.44–1.00)
GFR calc non Af Amer: >60 mL/min (ref >60)
Glucose, Bld: 79 mg/dL (ref 65–99)
Potassium: 3.7 mmol/L (ref 3.5–5.1)
Sodium: 137 mmol/L (ref 135–145)

## 2015-12-15 LAB — PROTIME-INR
INR: 1.08
Prothrombin Time: 14 seconds (ref 11.4–15.2)

## 2015-12-15 LAB — ETHANOL: Alcohol, Ethyl (B): <5 mg/dL (ref <5)

## 2015-12-15 LAB — CBG MONITORING, ED: GLUCOSE-CAPILLARY: 137 mg/dL — AB (ref 65–99)

## 2015-12-15 MED ORDER — ALBUTEROL SULFATE (2.5 MG/3ML) 0.083% IN NEBU: 2.5000 mg | INHALATION_SOLUTION | Freq: Four times a day (QID) | RESPIRATORY_TRACT | Status: DC | PRN

## 2015-12-15 MED ORDER — DEXAMETHASONE 4 MG PO TABS
4.0000 mg | ORAL_TABLET | Freq: Two times a day (BID) | ORAL | Status: DC
  Administered 2015-12-15 – 2015-12-16 (×2): 4 mg via ORAL
  Filled 2015-12-15 (×2): qty 1

## 2015-12-15 MED ORDER — SENNA 8.6 MG PO TABS
1.0000 | ORAL_TABLET | Freq: Two times a day (BID) | ORAL | Status: DC
  Administered 2015-12-16: 8.6 mg via ORAL
  Filled 2015-12-15: qty 1

## 2015-12-15 MED ORDER — INSULIN ASPART 100 UNIT/ML IV SOLN
10.0000 [IU] | Freq: Once | INTRAVENOUS | Status: DC
  Filled 2015-12-15: qty 1

## 2015-12-15 MED ORDER — ENOXAPARIN SODIUM 40 MG/0.4ML ~~LOC~~ SOLN
40.0000 mg | SUBCUTANEOUS | Status: DC
  Administered 2015-12-15: 40 mg via SUBCUTANEOUS
  Filled 2015-12-15: qty 0.4

## 2015-12-15 MED ORDER — SODIUM CHLORIDE 0.9 % IV BOLUS (SEPSIS)
500.0000 mL | Freq: Once | INTRAVENOUS | Status: AC
  Administered 2015-12-15: 500 mL via INTRAVENOUS

## 2015-12-15 MED ORDER — DEXAMETHASONE SODIUM PHOSPHATE 10 MG/ML IJ SOLN
10.0000 mg | Freq: Once | INTRAMUSCULAR | Status: DC
Start: 2015-12-15 — End: 2015-12-15

## 2015-12-15 MED ORDER — FERROUS SULFATE 325 (65 FE) MG PO TABS
325.0000 mg | ORAL_TABLET | Freq: Two times a day (BID) | ORAL | Status: DC
  Administered 2015-12-15 – 2015-12-16 (×2): 325 mg via ORAL
  Filled 2015-12-15 (×2): qty 1

## 2015-12-15 MED ORDER — PROCHLORPERAZINE MALEATE 10 MG PO TABS
10.0000 mg | ORAL_TABLET | Freq: Four times a day (QID) | ORAL | Status: DC | PRN
  Filled 2015-12-15: qty 1

## 2015-12-15 MED ORDER — SODIUM CHLORIDE 0.9% FLUSH
3.0000 mL | Freq: Two times a day (BID) | INTRAVENOUS | Status: DC
  Administered 2015-12-15 – 2015-12-16 (×2): 3 mL via INTRAVENOUS

## 2015-12-15 MED ORDER — INSULIN ASPART 100 UNIT/ML IV SOLN
5.0000 [IU] | Freq: Once | INTRAVENOUS | Status: AC
  Administered 2015-12-15: 5 [IU] via INTRAVENOUS

## 2015-12-15 MED ORDER — DEXTROSE 10 % IV SOLN: Freq: Once | INTRAVENOUS | Status: DC

## 2015-12-15 MED ORDER — SODIUM CHLORIDE 0.9 % IV SOLN
INTRAVENOUS | Status: DC
  Administered 2015-12-15 – 2015-12-16 (×3): via INTRAVENOUS

## 2015-12-15 MED ORDER — DEXTROSE 50 % IV SOLN
1.0000 | Freq: Once | INTRAVENOUS | Status: AC
  Administered 2015-12-15: 50 mL via INTRAVENOUS

## 2015-12-15 MED ORDER — ASPIRIN EC 81 MG PO TBEC
81.0000 mg | DELAYED_RELEASE_TABLET | ORAL | Status: DC
  Administered 2015-12-16: 81 mg via ORAL
  Filled 2015-12-15: qty 1

## 2015-12-15 MED ORDER — MEGESTROL ACETATE 400 MG/10ML PO SUSP
600.0000 mg | Freq: Every day | ORAL | Status: DC
  Filled 2015-12-15: qty 15

## 2015-12-15 MED ORDER — SODIUM POLYSTYRENE SULFONATE 15 GM/60ML PO SUSP
30.0000 g | Freq: Once | ORAL | Status: AC
  Administered 2015-12-15: 30 g via ORAL
  Filled 2015-12-15: qty 120

## 2015-12-15 MED ORDER — ONDANSETRON HCL 4 MG/2ML IJ SOLN: 4.0000 mg | Freq: Four times a day (QID) | INTRAMUSCULAR | Status: DC | PRN

## 2015-12-15 MED ORDER — HYDROCODONE-ACETAMINOPHEN 5-325 MG PO TABS
1.0000 | ORAL_TABLET | Freq: Four times a day (QID) | ORAL | Status: DC | PRN
  Administered 2015-12-15: 1 via ORAL
  Filled 2015-12-15: qty 1

## 2015-12-15 MED ORDER — DEXTROSE 50 % IV SOLN
INTRAVENOUS | Status: AC
  Filled 2015-12-15: qty 50

## 2015-12-15 MED ORDER — MEGESTROL ACETATE 625 MG/5ML PO SUSP: 625.0000 mg | Freq: Every day | ORAL | Status: DC

## 2015-12-15 MED ORDER — MIRTAZAPINE 15 MG PO TABS
15.0000 mg | ORAL_TABLET | Freq: Every day | ORAL | Status: DC
  Administered 2015-12-15: 15 mg via ORAL
  Filled 2015-12-15: qty 1

## 2015-12-15 MED ORDER — MEGESTROL ACETATE 400 MG/10ML PO SUSP
800.0000 mg | Freq: Every day | ORAL | Status: DC
  Administered 2015-12-15 – 2015-12-16 (×2): 800 mg via ORAL
  Filled 2015-12-15 (×2): qty 20

## 2015-12-15 MED ORDER — ALBUTEROL SULFATE (2.5 MG/3ML) 0.083% IN NEBU
10.0000 mg | INHALATION_SOLUTION | Freq: Once | RESPIRATORY_TRACT | Status: AC
Start: 2015-12-15 — End: 2015-12-15
  Administered 2015-12-15: 10 mg via RESPIRATORY_TRACT
  Filled 2015-12-15: qty 12

## 2015-12-15 MED ORDER — ONDANSETRON HCL 4 MG PO TABS
4.0000 mg | ORAL_TABLET | Freq: Four times a day (QID) | ORAL | Status: DC | PRN
Start: 2015-12-15 — End: 2015-12-16

## 2015-12-15 MED ORDER — SODIUM CHLORIDE 0.9 % IV SOLN
1.0000 g | Freq: Once | INTRAVENOUS | Status: AC
  Administered 2015-12-15: 1 g via INTRAVENOUS
  Filled 2015-12-15: qty 10

## 2015-12-15 MED ORDER — SODIUM CHLORIDE 0.9 % IV SOLN
1000.0000 mg | Freq: Two times a day (BID) | INTRAVENOUS | Status: DC
  Administered 2015-12-15 – 2015-12-16 (×2): 1000 mg via INTRAVENOUS
  Filled 2015-12-15 (×3): qty 10

## 2015-12-15 NOTE — H&P (Signed)
History and Physical    [name] NFA:[id number] DOB: 03-Sep-1950 DOA: 12/15/2015  PCP: Rogers Blocker, MD Patient coming from: Home   Chief Complaint: AMS.   HPI: Rose [name] is a 65 y.o. 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 Dr Burr Medico was recommending Nivolumab, brain radiation  vs hospice care. Dr Burr Medico will see patient on consultation for further recommendations. . Patient presents with generalized weakness, dizziness. She had an episode day prior of admission where she was dropping things, unable to stand, non verbal, episode lasted few minutes per sister report.  She denies headaches, diarrhea, abdominal pain. She has not been eating well.   ED Course: CT head: Widespread intraparenchymal brain metastatic disease, better delineated on contrast enhanced MR. The overall degree of vasogenic edema at multiple sites is stable. There is no ventricular effacement or midline shift. No acute hemorrhage evident. No obvious new lesions evident, noting that MR with contrast is more sensitive for intraparenchymal lesions than is noncontrast enhanced CT examination. Cr 0.6, K at 6.5, sodium 131, ck 59, lactic acid 1.9---1.56, calcium 11, bili 1.8, hb 17, UDS positive for opioids.   Review of Systems: As per HPI otherwise 10 point review of systems negative.    Past Medical History:  Diagnosis Date  . Asthmatic bronchitis   . Brain cancer ([place])   . Breast cancer ([place])   . Chronic bronchitis ([place])   . Family history of adverse reaction to anesthesia    "my son passed away I think related to the anesthesia"  . Hyperlipidemia   . Hypertension   . Seizures ([place]) 05/10/2015   "it may have been related to me taking Cipro" (05/11/2015)  . Sinus headache     Past Surgical History:  Procedure Laterality Date  . ABDOMINAL  HYSTERECTOMY    . APPENDECTOMY    . BREAST BIOPSY Right   . BREAST LUMPECTOMY Right   . CATARACT EXTRACTION Right 08/26/2015  . LAPAROSCOPIC CHOLECYSTECTOMY    . VIDEO BRONCHOSCOPY WITH ENDOBRONCHIAL NAVIGATION N/A 05/14/2015   Procedure: VIDEO BRONCHOSCOPY WITH ENDOBRONCHIAL NAVIGATION;  Surgeon: Collene Gobble, MD;  Location: [facility];  Service: Thoracic;  Laterality: N/A;  . VIDEO BRONCHOSCOPY WITH ENDOBRONCHIAL ULTRASOUND N/A 05/14/2015   Procedure: VIDEO BRONCHOSCOPY WITH ENDOBRONCHIAL ULTRASOUND;  Surgeon: Collene Gobble, MD;  Location: [facility];  Service: Thoracic;  Laterality: N/A;     reports that she has quit smoking. Her smoking use included Cigarettes. She has a 18.00 pack-year smoking history. She has never used smokeless tobacco. She reports that she does not drink alcohol or use drugs.  No Known Allergies  Family History  Problem Relation Age of Onset  . Heart failure Father   . Breast cancer Mother 19  . Gastric cancer Daughter 41  . Diabetes Sister   . Hypertension Sister     Prior to Admission medications   Medication Sig Start Date End Date Taking? Authorizing Provider  dexamethasone (DECADRON) 4 MG tablet Take 1 tablet (4 mg total) by mouth 2 (two) times daily. 12/04/15  Yes Truitt Merle, MD  ferrous sulfate 325 (65 FE) MG tablet Take 325 mg by mouth 2 (two) times daily.   Yes Historical Provider, MD  albuterol (PROVENTIL) (2.5 MG/3ML) 0.083% nebulizer solution Take 2.5 mg by nebulization every 6 (six) hours as needed for wheezing or shortness of  breath. Not sure of concentration    Historical Provider, MD  aspirin EC 81 MG tablet Take 81 mg by mouth every morning.     Historical Provider, MD  cholecalciferol (VITAMIN D) 1000 units tablet Take 1 tablet (1,000 Units total) by mouth daily. Patient not taking: Reported on 11/26/2015 06/17/15   Marcial Pacas, MD  doxycycline (VIBRAMYCIN) 100 MG capsule Take 1 capsule (100 mg total) by mouth 2 (two) times daily. One po bid x 7  days Patient not taking: Reported on 11/26/2015 11/16/15   Sherwood Gambler, MD  esomeprazole (NEXIUM) 20 MG packet Take 20 mg by mouth daily before breakfast. Patient not taking: Reported on 11/26/2015 11/13/15   Truitt Merle, MD  HYDROcodone-acetaminophen (NORCO) 5-325 MG tablet Take 1 tablet by mouth every 6 (six) hours as needed for severe pain. 12/04/15   Truitt Merle, MD  lactobacillus acidophilus (BACID) TABS tablet Take 1 tablet by mouth every morning.     Historical Provider, MD  levETIRAcetam (KEPPRA) 100 MG/ML solution Take 5 mLs (500 mg total) by mouth 2 (two) times daily. 06/22/15   Marcial Pacas, MD  megestrol (MEGACE ES) 625 MG/5ML suspension Take 5 mLs (625 mg total) by mouth daily. 12/04/15   Truitt Merle, MD  mirtazapine (REMERON) 15 MG tablet Take 1-2 tablets (15-30 mg total) by mouth at bedtime. Take one tab before bed time, for 2 weeks, if tolerates well, increase to 2 tab at bed time daily 11/27/15   Truitt Merle, MD  ondansetron (ZOFRAN) 8 MG tablet Take 1 tablet (8 mg total) by mouth every 8 (eight) hours as needed for nausea or vomiting. 09/18/15   Truitt Merle, MD  prochlorperazine (COMPAZINE) 10 MG tablet Take 1 tablet (10 mg total) by mouth every 6 (six) hours as needed (Nausea or vomiting). 09/18/15   Truitt Merle, MD  zolpidem (AMBIEN) 5 MG tablet Take 1 tablet (5 mg total) by mouth at bedtime as needed for sleep. 11/13/15   Truitt Merle, MD    Physical Exam: Vitals:   12/15/15 1715 12/15/15 1730 12/15/15 1745 12/15/15 1752  BP: 117/63 115/63    Pulse: (!) 121 (!) 122 (!) 124   Resp: 26 26 19    Temp:      TempSrc:      SpO2: 97% 98% 98% 98%  Weight:      Height:          Constitutional: NAD, calm, comfortable, chronically ill  Vitals:   12/15/15 1715 12/15/15 1730 12/15/15 1745 12/15/15 1752  BP: 117/63 115/63    Pulse: (!) 121 (!) 122 (!) 124   Resp: 26 26 19    Temp:      TempSrc:      SpO2: 97% 98% 98% 98%  Weight:      Height:       Eyes: PERRL, lids and conjunctivae normal ENMT:  Mucous membranes are moist. Posterior pharynx clear of any exudate or lesions.Normal dentition.  Neck: normal, supple, no masses, no thyromegaly Respiratory: clear to auscultation bilaterally, no wheezing, no crackles. Normal respiratory effort. No accessory muscle use.  Cardiovascular: Regular rate and rhythm, no murmurs / rubs / gallops. No extremity edema. 2+ pedal pulses. No carotid bruits.  Abdomen: no tenderness, no masses palpated. No hepatosplenomegaly. Bowel sounds positive.  Musculoskeletal: no clubbing / cyanosis. No joint deformity upper and lower extremities. Good ROM, no contractures. Normal muscle tone.  Skin: no rashes, lesions, ulcers. No induration Neurologic: CN 2-12 grossly intact. Sensation intact, DTR normal. Strength  5/5 in all 4, except left upper 4/5. Slowly answer questions.  Psychiatric: Normal judgment and insight. Alert and oriented x 3. Normal mood.     Labs on Admission: I have personally reviewed following labs and imaging studies  CBC:  Recent Labs Lab 12/15/15 1404 12/15/15 1418  WBC 11.5*  --   NEUTROABS 8.8*  --   HGB 15.8* 17.3*  HCT 46.9* 51.0*  MCV 85.4  --   PLT 467*  --    Basic Metabolic Panel:  Recent Labs Lab 12/15/15 1404 12/15/15 1418  NA 133* 131*  K 6.4* 6.5*  CL 95* 99*  CO2 25  --   GLUCOSE 87 83  BUN 19 31*  CREATININE 0.63 0.50  CALCIUM 11.0*  --    GFR: Estimated Creatinine Clearance: 48.7 mL/min (by C-G formula based on SCr of 0.5 mg/dL). Liver Function Tests:  Recent Labs Lab 12/15/15 1404  AST 29  ALT 18  ALKPHOS 104  BILITOT 1.8*  PROT 8.4*  ALBUMIN 3.2*   No results for input(s): LIPASE, AMYLASE in the last 168 hours. No results for input(s): AMMONIA in the last 168 hours. Coagulation Profile:  Recent Labs Lab 12/15/15 1404  INR 1.08   Cardiac Enzymes:  Recent Labs Lab 12/15/15 1404  CKTOTAL 59   BNP (last 3 results) No results for input(s): PROBNP in the last 8760 hours. HbA1C: No  results for input(s): HGBA1C in the last 72 hours. CBG:  Recent Labs Lab 12/15/15 1752  GLUCAP 137*   Lipid Profile: No results for input(s): CHOL, HDL, LDLCALC, TRIG, CHOLHDL, LDLDIRECT in the last 72 hours. Thyroid Function Tests: No results for input(s): TSH, T4TOTAL, FREET4, T3FREE, THYROIDAB in the last 72 hours. Anemia Panel: No results for input(s): VITAMINB12, FOLATE, FERRITIN, TIBC, IRON, RETICCTPCT in the last 72 hours. Urine analysis:    Component Value Date/Time   COLORURINE YELLOW 12/15/2015 1500   APPEARANCEUR CLEAR 12/15/2015 1500   LABSPEC 1.016 12/15/2015 1500   PHURINE 7.0 12/15/2015 1500   GLUCOSEU NEGATIVE 12/15/2015 1500   HGBUR NEGATIVE 12/15/2015 1500   HGBUR negative 04/07/2010 1033   BILIRUBINUR NEGATIVE 12/15/2015 1500   KETONESUR NEGATIVE 12/15/2015 1500   PROTEINUR NEGATIVE 12/15/2015 1500   UROBILINOGEN 0.2 11/21/2011 1124   NITRITE NEGATIVE 12/15/2015 1500   LEUKOCYTESUR NEGATIVE 12/15/2015 1500   Sepsis Labs: !!!!!!!!!!!!!!!!!!!!!!!!!!!!!!!!!!!!!!!!!!!! @LABRCNTIP (procalcitonin:4,lacticidven:4) )No results found for this or any previous visit (from the past 240 hour(s)).   Radiological Exams on Admission: Ct Head Wo Contrast  Result Date: 12/15/2015 CLINICAL DATA:  Right upper extremity weakness for 1 day. History of lung carcinoma EXAM: CT HEAD WITHOUT CONTRAST TECHNIQUE: Contiguous axial images were obtained from the base of the skull through the vertex without intravenous contrast. COMPARISON:  Head CT May 11, 2015 and brain MRI December 04, 2015 FINDINGS: Brain: There is age related atrophy. There are multiple foci of vasogenic edema throughout the brain, not appreciably changed from recent MR. There is edema with known tumor in the right frontal lobe which extends to but does not efface the frontal horn of the right lateral ventricle. There is extensive vasogenic edema in the posterior temporal region. There is edema in the superior right  occipital region as well as in the medial left parietal and occipital region. Contrast enhanced MR shows several masses in these areas. There is a focal area of increased attenuation in the head of the caudate nucleus on the right consistent with a mass. A second area of increased attenuation  in the superior mid left temporal lobe is consistent with a mass. The largest of these lesions which is seen on noncontrast enhanced study measures 1 cm in the left temporal lobe. There may well be larger masses which cannot be well delineated there is a subtle mass in the superior anterior anterior left thalamus with mild surrounding edema. There is prominence in the upper cervical cord region where it masses noted on recent MR occupying much of the upper cervical cord. There is no midline shift. There is no subdural or epidural fluid. No acute hemorrhage is evident. Vascular: No hyperdense vessel. There are foci of calcification in each carotid siphon. Skull: No blastic or lytic bone lesions are demonstrable. No fracture evident. Sinuses/Orbits: Visualized paranasal sinuses show evidence of opacification in the left sphenoid region. No intraorbital lesions are evident. Other: Mastoid air cells are clear. IMPRESSION: Widespread intraparenchymal brain metastatic disease, better delineated on contrast enhanced MR. The overall degree of vasogenic edema at multiple sites is stable. There is no ventricular effacement or midline shift. No acute hemorrhage evident. No obvious new lesions evident, noting that MR with contrast is more sensitive for intraparenchymal lesions than is noncontrast enhanced CT examination. Calcification noted in the carotid siphon regions. Focal left sphenoid sinus disease. If further evaluation is felt to be warranted, brain MRI pre and post-contrast to directly compare with recent prior study would be the imaging study of choice for further assessment. Electronically Signed   By: Lowella Grip III M.D.    On: 12/15/2015 16:12    EKG: Independently reviewed. Sinus tachycardia, non specific T wave abnormalities.   Assessment/Plan Active Problems:   Essential hypertension   Personal history of malignant neoplasm of breast   Primary cancer of right lung metastatic to other site [facility])   Metastatic squamous cell carcinoma to brain (HCC)   Anemia, iron deficiency   Hyperkalemia  1-AMS, episode of left side weakness, non verbal, lasted for few minutes, happen day prior of admission 11-13. Patient also presents with weakness , dizziness.  Could be related to brain metastasis lesions, seizure, vs pain medications, dehydration.  I will increase keppra to 1 gr BID. Will continue with decadron BID. CT head negative for midline shift. Patient had recent MRI 11-03 which showed metastasis diseases.  IV keppra.  Decadron BID.  IV fluids.  Dr Burr Medico will see patient in consultation to determine further care.  Check ammonia level.   2-Dehydration, hemoconcentration.  Hb at 17. IV fluids. She has recieved 1.5 L in the ED.   3-Hyperkalemia; unclear etiology, was not on supplement, tumor lysis ? Check uric acid.  IV fluids. IV insulin, Amp D 50, kayexalate. Renal diet.  Repeat B-met tonight.   4-Stage IV lung cancer with metastasis to brain, liver; ED spoke with Dr Burr Medico, she will see patient in consultation to discussed option for care.  Also palliative care order place in Epic.     DVT prophylaxis: Lovenox.  Code Status: DNR/DNI discussed with patient and sister. Also prior discussion between patient and Dr Burr Medico  Family Communication: sister at bedside.  Disposition Plan: discharge in 2 to 3 days, will depends on Goals of care.  Consults called: Dr Burr Medico , palliative care by ED PA Admission status: Telemetry due to hyperkalemia.    Elmarie Shiley MD [facility] Pager [phone]  If 7PM-7AM, please contact night-coverage [email or web address] Password [email or web address]  12/15/2015, 5:55 PM

## 2015-12-15 NOTE — ED Notes (Signed)
Patient moved physically from hallway to A2; visitor at bedside

## 2015-12-15 NOTE — ED Triage Notes (Signed)
Pt. Family called out today because patient seemed confused and they were concerned about a possible overdose because of a new oxy script. Pt. Was weak and lethargic when family found her and weaker on the R side. Last seen well was yesterday at 4 PM

## 2015-12-15 NOTE — Progress Notes (Signed)
Pt arrived in stable condition. Pt placed on tele, CCMD notified. Pt bed alarm on. Will continue to monitor.

## 2015-12-15 NOTE — ED Notes (Signed)
CBG 137; RN aware

## 2015-12-15 NOTE — ED Notes (Signed)
Spoke to Dr. Barbaraann Cao regarding current orders to lower K+ with low glucose. MD acknowledged and placing new orders.

## 2015-12-15 NOTE — ED Notes (Signed)
Transport Monitor at bedside.

## 2015-12-15 NOTE — ED Provider Notes (Signed)
[facility] DEPT Provider Note   CSN: [id number] Arrival date & time: 12/15/15  1322     History   Chief Complaint Chief Complaint  Patient presents with  . Altered Mental Status    HPI   Blood pressure (!) 113/51, pulse 118, temperature 98.3 F (36.8 C), temperature source Oral, resp. rate 18, height '5\' 1"'$  (1.549 m), weight 44 kg, SpO2 99 %.  Rose [name] is a 65 y.o. female brought in by EMS accompanied by her sister who supplies most of the history. Past medical history significant for metastatic lung cancer, she lives by herself and was seen normal last night around 4 PM by her sister. States that she went to visit her this morning and she was very weak, dropping things, unable to stand, altered nonverbal, she states that this lasted for approximately 2 minutes and then spontaneously resolved. She had 2 falls yesterday and she denies any head trauma, she's not anticoagulated, she denies any change in her vision, headache, numbness or weakness. Review shows MRI on 11 3 with marked progression of metastatic deposits in the brain with extensive cerebral edema.  Dr. Burr Medico called in dexamethasone 4 mg twice a day patient is DNR/DNI, which I have confirmed with her and her sister. There was going to be a discussion of hospice over this weekend. Per chart review patient was a no-show for her last visit, sister answered the phone and stated "we are going to different route for her treatment." Declined rescheduling with oncology. Medical POA is granddaughter Rose [name]. Patient her sister states that she does not what discontinue her care with Dr. Burr Medico, states that there is a miscommunication and was unintended that this appointment be missed.   Past Medical History:  Diagnosis Date  . Asthmatic bronchitis   . Brain cancer ([place])   . Breast cancer ([place])   . Chronic bronchitis ([place])   . Family history of adverse reaction to anesthesia    "my son passed away I think related to the anesthesia"  .  Hyperlipidemia   . Hypertension   . Seizures ([place]) 05/10/2015   "it may have been related to me taking Cipro" (05/11/2015)  . Sinus headache     Patient Active Problem List   Diagnosis Date Noted  . Hyperkalemia 12/15/2015  . Goals of care, counseling/discussion 12/04/2015  . DNR no code (do not resuscitate) 12/04/2015  . Hypercalcemia 10/16/2015  . Port catheter in place 10/02/2015  . Depression 09/25/2015  . Anemia, iron deficiency 06/26/2015  . Partial seizure ([place]) 06/17/2015  . 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 Primary cancer of right lung metastatic to other site ([place]) 06/07/2015  . Mediastinal adenopathy 05/14/2015  . Elective surgery   . Malnutrition of moderate degree 05/12/2015  . Pyrexia   . Lung nodule   . Aphasia 05/11/2015  . Abnormal CT scan, head 05/11/2015  . Pulmonary nodule, right 05/11/2015  . HYPERLIPIDEMIA 04/17/2008  . TOBACCO ABUSE 02/12/2008  . Personal history of malignant neoplasm of breast 02/12/2008  . GERD 07/06/2007  . Essential hypertension 03/04/2007  . ALLERGIC RHINITIS 03/04/2007  . COPD 03/04/2007    Past Surgical History:  Procedure Laterality Date  . ABDOMINAL HYSTERECTOMY    . APPENDECTOMY    . BREAST BIOPSY Right   . BREAST LUMPECTOMY Right   . CATARACT EXTRACTION Right 08/26/2015  . LAPAROSCOPIC CHOLECYSTECTOMY    . VIDEO BRONCHOSCOPY WITH ENDOBRONCHIAL NAVIGATION N/A 05/14/2015  Procedure: VIDEO BRONCHOSCOPY WITH ENDOBRONCHIAL NAVIGATION;  Surgeon: Collene Gobble, MD;  Location: [facility] OR;  Service: Thoracic;  Laterality: N/A;  . VIDEO BRONCHOSCOPY WITH ENDOBRONCHIAL ULTRASOUND N/A 05/14/2015   Procedure: VIDEO BRONCHOSCOPY WITH ENDOBRONCHIAL ULTRASOUND;  Surgeon: Collene Gobble, MD;  Location: [facility];  Service: Thoracic;  Laterality: N/A;    OB History    No data available       Home Medications    Prior to Admission medications   Medication Sig Start  Date End Date Taking? Authorizing Provider  albuterol (PROVENTIL) (2.5 MG/3ML) 0.083% nebulizer solution Take 2.5 mg by nebulization every 6 (six) hours as needed for wheezing or shortness of breath. Not sure of concentration    Historical Provider, MD  aspirin EC 81 MG tablet Take 81 mg by mouth every morning.     Historical Provider, MD  cholecalciferol (VITAMIN D) 1000 units tablet Take 1 tablet (1,000 Units total) by mouth daily. Patient not taking: Reported on 11/26/2015 06/17/15   Marcial Pacas, MD  dexamethasone (DECADRON) 4 MG tablet Take 1 tablet (4 mg total) by mouth 2 (two) times daily. 12/04/15   Truitt Merle, MD  doxycycline (VIBRAMYCIN) 100 MG capsule Take 1 capsule (100 mg total) by mouth 2 (two) times daily. One po bid x 7 days Patient not taking: Reported on 11/26/2015 11/16/15   Sherwood Gambler, MD  esomeprazole (NEXIUM) 20 MG packet Take 20 mg by mouth daily before breakfast. Patient not taking: Reported on 11/26/2015 11/13/15   Truitt Merle, MD  ferrous sulfate 325 (65 FE) MG tablet Take 325 mg by mouth 2 (two) times daily.    Historical Provider, MD  HYDROcodone-acetaminophen (NORCO) 5-325 MG tablet Take 1 tablet by mouth every 6 (six) hours as needed for severe pain. 12/04/15   Truitt Merle, MD  lactobacillus acidophilus (BACID) TABS tablet Take 1 tablet by mouth every morning.     Historical Provider, MD  levETIRAcetam (KEPPRA) 100 MG/ML solution Take 5 mLs (500 mg total) by mouth 2 (two) times daily. 06/22/15   Marcial Pacas, MD  megestrol (MEGACE ES) 625 MG/5ML suspension Take 5 mLs (625 mg total) by mouth daily. 12/04/15   Truitt Merle, MD  mirtazapine (REMERON) 15 MG tablet Take 1-2 tablets (15-30 mg total) by mouth at bedtime. Take one tab before bed time, for 2 weeks, if tolerates well, increase to 2 tab at bed time daily 11/27/15   Truitt Merle, MD  ondansetron (ZOFRAN) 8 MG tablet Take 1 tablet (8 mg total) by mouth every 8 (eight) hours as needed for nausea or vomiting. 09/18/15   Truitt Merle, MD    prochlorperazine (COMPAZINE) 10 MG tablet Take 1 tablet (10 mg total) by mouth every 6 (six) hours as needed (Nausea or vomiting). 09/18/15   Truitt Merle, MD  zolpidem (AMBIEN) 5 MG tablet Take 1 tablet (5 mg total) by mouth at bedtime as needed for sleep. 11/13/15   Truitt Merle, MD    Family History Family History  Problem Relation Age of Onset  . Heart failure Father   . Breast cancer Mother 60  . Gastric cancer Daughter 79  . Diabetes Sister   . Hypertension Sister     Social History Social History  Substance Use Topics  . Smoking status: Former Smoker    Packs/day: 0.50    Years: 36.00    Types: Cigarettes  . Smokeless tobacco: Never Used     Comment: quit 2 weeks ago 05/13/15  . Alcohol use No  Allergies   Patient has no known allergies.   Review of Systems Review of Systems   Physical Exam Updated Vital Signs BP (!) 107/45   Pulse (!) 122   Temp 98.3 F (36.8 C)   Resp 21   Ht '5\' 1"'$  (1.549 m)   Wt 44 kg   SpO2 97%   BMI 18.33 kg/m   Physical Exam  Constitutional: She is oriented to person, place, and time. She appears well-developed and well-nourished. No distress.  HENT:  Head: Normocephalic and atraumatic.  Mouth/Throat: Oropharynx is clear and moist.  Eyes: Conjunctivae and EOM are normal. Pupils are equal, round, and reactive to light.  Neck: Normal range of motion.  Cardiovascular: Normal rate, regular rhythm and intact distal pulses.   Pulmonary/Chest: Effort normal and breath sounds normal.  Abdominal: Soft. There is no tenderness.  Musculoskeletal: Normal range of motion.  Neurological: She is alert and oriented to person, place, and time.  Upper extremity 3 out of 5 strength, positive pronator drift.  Skin: She is not diaphoretic.  Psychiatric: She has a normal mood and affect.  Nursing note and vitals reviewed.    ED Treatments / Results  Labs (all labs ordered are listed, but only abnormal results are displayed) Labs Reviewed  CBC -  Abnormal; Notable for the following:       Result Value   WBC 11.5 (*)    RBC 5.49 (*)    Hemoglobin 15.8 (*)    HCT 46.9 (*)    RDW 17.5 (*)    Platelets 467 (*)    All other components within normal limits  DIFFERENTIAL - Abnormal; Notable for the following:    Neutro Abs 8.8 (*)    Monocytes Absolute 1.2 (*)    All other components within normal limits  COMPREHENSIVE METABOLIC PANEL - Abnormal; Notable for the following:    Sodium 133 (*)    Potassium 6.4 (*)    Chloride 95 (*)    Calcium 11.0 (*)    Total Protein 8.4 (*)    Albumin 3.2 (*)    Total Bilirubin 1.8 (*)    All other components within normal limits  RAPID URINE DRUG SCREEN, HOSP PERFORMED - Abnormal; Notable for the following:    Opiates POSITIVE (*)    All other components within normal limits  I-STAT CHEM 8, ED - Abnormal; Notable for the following:    Sodium 131 (*)    Potassium 6.5 (*)    Chloride 99 (*)    BUN 31 (*)    Hemoglobin 17.3 (*)    HCT 51.0 (*)    All other components within normal limits  I-STAT CG4 LACTIC ACID, ED - Abnormal; Notable for the following:    Lactic Acid, Venous 1.92 (*)    All other components within normal limits  PROTIME-INR  APTT  URINALYSIS, ROUTINE W REFLEX MICROSCOPIC (NOT AT [facility])  CK  COMPREHENSIVE METABOLIC PANEL  ETHANOL  SODIUM, URINE, RANDOM  OSMOLALITY, URINE  CREATININE, URINE, RANDOM  CBG MONITORING, ED  I-STAT TROPOININ, ED  I-STAT CG4 LACTIC ACID, ED    EKG  EKG Interpretation  Date/Time:  Tuesday December 15 2015 13:29:20 EST Ventricular Rate:  119 PR Interval:  132 QRS Duration: 98 QT Interval:  304 QTC Calculation: 427 R Axis:   80 Text Interpretation:  Sinus tachycardia Biatrial enlargement Incomplete right bundle branch block Nonspecific ST abnormality Abnormal ECG Baseline wander No significant change since last tracing Confirmed by [facility] MD, PEDRO (  [id number]) on 12/15/2015 1:38:17 PM Also confirmed by [facility] MD, [place] [phone]), editor  [facility] CT, Leda Gauze [phone])  on 12/15/2015 2:30:30 PM       Radiology Ct Head Wo Contrast  Result Date: 12/15/2015 CLINICAL DATA:  Right upper extremity weakness for 1 day. History of lung carcinoma EXAM: CT HEAD WITHOUT CONTRAST TECHNIQUE: Contiguous axial images were obtained from the base of the skull through the vertex without intravenous contrast. COMPARISON:  Head CT May 11, 2015 and brain MRI December 04, 2015 FINDINGS: Brain: There is age related atrophy. There are multiple foci of vasogenic edema throughout the brain, not appreciably changed from recent MR. There is edema with known tumor in the right frontal lobe which extends to but does not efface the frontal horn of the right lateral ventricle. There is extensive vasogenic edema in the posterior temporal region. There is edema in the superior right occipital region as well as in the medial left parietal and occipital region. Contrast enhanced MR shows several masses in these areas. There is a focal area of increased attenuation in the head of the caudate nucleus on the right consistent with a mass. A second area of increased attenuation in the superior mid left temporal lobe is consistent with a mass. The largest of these lesions which is seen on noncontrast enhanced study measures 1 cm in the left temporal lobe. There may well be larger masses which cannot be well delineated there is a subtle mass in the superior anterior anterior left thalamus with mild surrounding edema. There is prominence in the upper cervical cord region where it masses noted on recent MR occupying much of the upper cervical cord. There is no midline shift. There is no subdural or epidural fluid. No acute hemorrhage is evident. Vascular: No hyperdense vessel. There are foci of calcification in each carotid siphon. Skull: No blastic or lytic bone lesions are demonstrable. No fracture evident. Sinuses/Orbits: Visualized paranasal sinuses show evidence of opacification in the  left sphenoid region. No intraorbital lesions are evident. Other: Mastoid air cells are clear. IMPRESSION: Widespread intraparenchymal brain metastatic disease, better delineated on contrast enhanced MR. The overall degree of vasogenic edema at multiple sites is stable. There is no ventricular effacement or midline shift. No acute hemorrhage evident. No obvious new lesions evident, noting that MR with contrast is more sensitive for intraparenchymal lesions than is noncontrast enhanced CT examination. Calcification noted in the carotid siphon regions. Focal left sphenoid sinus disease. If further evaluation is felt to be warranted, brain MRI pre and post-contrast to directly compare with recent prior study would be the imaging study of choice for further assessment. Electronically Signed   By: Lowella Grip III M.D.   On: 12/15/2015 16:12    Procedures Procedures (including critical care time)  Medications Ordered in ED Medications  sodium chloride 0.9 % bolus 500 mL (not administered)  albuterol (PROVENTIL) (2.5 MG/3ML) 0.083% nebulizer solution 10 mg (not administered)  insulin aspart (novoLOG) injection 10 Units (not administered)  dextrose 10 % infusion (not administered)  sodium polystyrene (KAYEXALATE) 15 GM/60ML suspension 30 g (not administered)  sodium chloride 0.9 % bolus 500 mL (500 mLs Intravenous New Bag/Given 12/15/15 1521)  sodium chloride 0.9 % bolus 500 mL (0 mLs Intravenous Stopped 12/15/15 1631)     Initial Impression / Assessment and Plan / ED Course  I have reviewed the triage vital signs and the nursing notes.  Pertinent labs & imaging results that were available during my care of the patient  were reviewed by me and considered in my medical decision making (see chart for details).  Clinical Course     Vitals:   12/15/15 1433 12/15/15 1515 12/15/15 1521 12/15/15 1530  BP: 117/85 115/63  (!) 107/45  Pulse: (!) 121 (!) 127  (!) 122  Resp: '17 22  21  '$ Temp:   98.3  F (36.8 C)   TempSrc:      SpO2: 100% 98%  97%  Weight:      Height:        Medications  sodium chloride 0.9 % bolus 500 mL (not administered)  albuterol (PROVENTIL) (2.5 MG/3ML) 0.083% nebulizer solution 10 mg (not administered)  insulin aspart (novoLOG) injection 10 Units (not administered)  dextrose 10 % infusion (not administered)  sodium polystyrene (KAYEXALATE) 15 GM/60ML suspension 30 g (not administered)  sodium chloride 0.9 % bolus 500 mL (500 mLs Intravenous New Bag/Given 12/15/15 1521)  sodium chloride 0.9 % bolus 500 mL (0 mLs Intravenous Stopped 12/15/15 1631)    Rose [name] is 65 y.o. female presenting with Altered mental status she has metastatic lung cancer, multiple lesions in the brain. Patient has right upper extremity weakness with pronator drift, neurologic exam is otherwise grossly nonfocal.  Oncology consult from Dr. Alen Blew add appreciated: Agrees that this patient will likely need observation admission for placement in hospice evaluation with palliative care, she lives alone and she has had multiple falls, she has a new weakness, likely secondary to her metastatic disease. They recommended when this patient is admitted she be placed on Dr. Cecille Rubin rounding last of the oncology round and less so that they can evaluate her in the a.m.  Past with Dr. Burr Medico who will evaluate the patient and help determine what hospice would be appropriate.  Case discussed with triad hospitalist Dr. Tyrell Antonio who accepts admission  Final Clinical Impressions(s) / ED Diagnoses   Final diagnoses:  Hyperkalemia  Frequent falls  Transient alteration of awareness    New Prescriptions New Prescriptions   No medications on file     Monico Blitz, PA-C 12/15/15 [place], MD 12/16/15 [phone]

## 2015-12-15 NOTE — Progress Notes (Addendum)
Rose [name]   DOB:07-26-1950   IF#:[id number]   OMV#:[id number]  ONCOLOGY FOLLOW UP NOTE   Subjective: Pt is well known to me, under my care for her metastatic lung cancer, recently progressed in brain metastasis and extracranial disease, presented to emergency room today with mental status change, worsening fatigue, dizziness, and multiple episodes of fall at home. I saw patient and her family ( sister and granddaughter) in the emergency room   Objective:  Vitals:   12/15/15 1745 12/15/15 1800  BP:  109/56  Pulse: (!) 124 (!) 121  Resp: 19 21  Temp:      Body mass index is 18.33 kg/m.  Intake/Output Summary (Last 24 hours) at 12/15/15 2011 Last data filed at 12/15/15 1811  Gross per 24 hour  Intake             1000 ml  Output              650 ml  Net              350 ml    Pt is awake, drowsy, able to answer some simple questions.  Sclerae unicteric  Oropharynx clear  No peripheral adenopathy  Lungs clear -- no rales or rhonchi  Heart regular rate and rhythm  Abdomen benign     CBG (last 3)   Recent Labs  12/15/15 1752  GLUCAP 137*     Labs:  Lab Results  Component Value Date   WBC 11.5 (H) 12/15/2015   HGB 17.3 (H) 12/15/2015   HCT 51.0 (H) 12/15/2015   MCV 85.4 12/15/2015   PLT 467 (H) 12/15/2015   NEUTROABS 8.8 (H) 12/15/2015   CMP Latest Ref Rng & Units 12/15/2015 12/15/2015 12/15/2015  Glucose 65 - 99 mg/dL 79 87 83  BUN 6 - 20 mg/dL 12 16 31(H)  Creatinine 0.44 - 1.00 mg/dL 0.61 0.53 0.50  Sodium 135 - 145 mmol/L 137 134(L) 131(L)  Potassium 3.5 - 5.1 mmol/L 3.7 4.7 6.5(HH)  Chloride 101 - 111 mmol/L 102 102 99(L)  CO2 22 - 32 mmol/L 21(L) 21(L) -  Calcium 8.9 - 10.3 mg/dL 10.1 9.8 -  Total Protein 6.5 - 8.1 g/dL - 6.5 -  Total Bilirubin 0.3 - 1.2 mg/dL - 0.9 -  Alkaline Phos 38 - 126 U/L - 85 -  AST 15 - 41 U/L - 15 -  ALT 14 - 54 U/L - 14 -    Urine Studies No results for input(s): UHGB, CRYS in the last 72 hours.  Invalid input(s):  UACOL, UAPR, USPG, UPH, UTP, UGL, UKET, UBIL, UNIT, UROB, [place], UEPI, UWBC, Pamala D[place], [place]  Basic Metabolic Panel:  Recent Labs Lab 12/15/15 1404 12/15/15 1418 12/15/15 1710 12/15/15 1901  NA 133* 131* 134* 137  K 6.4* 6.5* 4.7 3.7  CL 95* 99* 102 102  CO2 25  --  21* 21*  GLUCOSE 87 83 87 79  BUN 19 31* 16 12  CREATININE 0.63 0.50 0.53 0.61  CALCIUM 11.0*  --  9.8 10.1   GFR Estimated Creatinine Clearance: 48.7 mL/min (by C-G formula based on SCr of 0.61 mg/dL). Liver Function Tests:  Recent Labs Lab 12/15/15 1404 12/15/15 1710  AST 29 15  ALT 18 14  ALKPHOS 104 85  BILITOT 1.8* 0.9  PROT 8.4* 6.5  ALBUMIN 3.2* 2.7*   No results for input(s): LIPASE, AMYLASE in the last 168 hours.  Recent Labs Lab 12/15/15 1901  AMMONIA 11   Coagulation  profile  Recent Labs Lab 12/15/15 1404  INR 1.08    CBC:  Recent Labs Lab 12/15/15 1404 12/15/15 1418  WBC 11.5*  --   NEUTROABS 8.8*  --   HGB 15.8* 17.3*  HCT 46.9* 51.0*  MCV 85.4  --   PLT 467*  --    Cardiac Enzymes:  Recent Labs Lab 12/15/15 1404  CKTOTAL 59   BNP: Invalid input(s): POCBNP CBG:  Recent Labs Lab 12/15/15 1752  GLUCAP 137*   D-Dimer No results for input(s): DDIMER in the last 72 hours. Hgb A1c No results for input(s): HGBA1C in the last 72 hours. Lipid Profile No results for input(s): CHOL, HDL, LDLCALC, TRIG, CHOLHDL, LDLDIRECT in the last 72 hours. Thyroid function studies No results for input(s): TSH, T4TOTAL, T3FREE, THYROIDAB in the last 72 hours.  Invalid input(s): FREET3 Anemia work up No results for input(s): VITAMINB12, FOLATE, FERRITIN, TIBC, IRON, RETICCTPCT in the last 72 hours. Microbiology No results found for this or any previous visit (from the past 240 hour(s)).    Studies:  Ct Head Wo Contrast  Result Date: 12/15/2015 CLINICAL DATA:  Right upper extremity weakness for 1 day. History of lung carcinoma EXAM: CT HEAD WITHOUT CONTRAST  TECHNIQUE: Contiguous axial images were obtained from the base of the skull through the vertex without intravenous contrast. COMPARISON:  Head CT May 11, 2015 and brain MRI December 04, 2015 FINDINGS: Brain: There is age related atrophy. There are multiple foci of vasogenic edema throughout the brain, not appreciably changed from recent MR. There is edema with known tumor in the right frontal lobe which extends to but does not efface the frontal horn of the right lateral ventricle. There is extensive vasogenic edema in the posterior temporal region. There is edema in the superior right occipital region as well as in the medial left parietal and occipital region. Contrast enhanced MR shows several masses in these areas. There is a focal area of increased attenuation in the head of the caudate nucleus on the right consistent with a mass. A second area of increased attenuation in the superior mid left temporal lobe is consistent with a mass. The largest of these lesions which is seen on noncontrast enhanced study measures 1 cm in the left temporal lobe. There may well be larger masses which cannot be well delineated there is a subtle mass in the superior anterior anterior left thalamus with mild surrounding edema. There is prominence in the upper cervical cord region where it masses noted on recent MR occupying much of the upper cervical cord. There is no midline shift. There is no subdural or epidural fluid. No acute hemorrhage is evident. Vascular: No hyperdense vessel. There are foci of calcification in each carotid siphon. Skull: No blastic or lytic bone lesions are demonstrable. No fracture evident. Sinuses/Orbits: Visualized paranasal sinuses show evidence of opacification in the left sphenoid region. No intraorbital lesions are evident. Other: Mastoid air cells are clear. IMPRESSION: Widespread intraparenchymal brain metastatic disease, better delineated on contrast enhanced MR. The overall degree of vasogenic  edema at multiple sites is stable. There is no ventricular effacement or midline shift. No acute hemorrhage evident. No obvious new lesions evident, noting that MR with contrast is more sensitive for intraparenchymal lesions than is noncontrast enhanced CT examination. Calcification noted in the carotid siphon regions. Focal left sphenoid sinus disease. If further evaluation is felt to be warranted, brain MRI pre and post-contrast to directly compare with recent prior study would be the imaging  study of choice for further assessment. Electronically Signed   By: Lowella Grip III M.D.   On: 12/15/2015 16:12    Assessment: 66 y.o. with stage IV NSCLC   1. Altered mental status, deteriorating of neurological and cognitive functions due to worsening brain metastasis 2. Metastatic non-small cell lung cancer, recent disease progression 3. Anemia in your past disease, and iron deficiency 4. Hypercalcemia, secondary to malignancy 5. Hypertension 6. COPD 7. Failure to thrive  Plan:  -Pt recently had significant cancer progression in brain and extracranial disease, her health condition and performance status has deteriorated rapidly. She is terminally ill, her life expectancy is likely going to be several days to a few weeks. I recommend hospice, she may be eligible for inpatient hospice at the [facility]. Please call hospice consult tomorrow -I reviewed this with patient and her sister, they all agree with hospice care -she lives alone, but has some family support. If she is not eligible for inpt hospice, family agrees with home hospice  -I will be happy to remain as her MD when she is under hospice care -she will be admitted to hospital, for supportive care, please limit her lab draw, etc -continue dexa '4mg'$  bid  -she is DNR/DNI   Truitt Merle, MD 12/15/2015  8:11 PM

## 2015-12-15 NOTE — ED Notes (Signed)
Patient placed on monitor, continuous pulse oximetry and blood pressure cuff; visitor at bedside

## 2015-12-15 NOTE — ED Notes (Signed)
Report attempted 

## 2015-12-16 DIAGNOSIS — R404 Transient alteration of awareness: Secondary | ICD-10-CM

## 2015-12-16 DIAGNOSIS — E875 Hyperkalemia: Secondary | ICD-10-CM

## 2015-12-16 LAB — CBC
HEMATOCRIT: 34.8 % — AB (ref 36.0–46.0)
HEMOGLOBIN: 11 g/dL — AB (ref 12.0–15.0)
MCH: 27.6 pg (ref 26.0–34.0)
MCHC: 31.9 g/dL (ref 30.0–36.0)
MCV: 86.6 fL (ref 78.0–100.0)
Platelets: 443 10*3/uL — ABNORMAL HIGH (ref 150–400)
RBC: 4.02 MIL/uL (ref 3.87–5.11)
RDW: 17.9 % — AB (ref 11.5–15.5)
WBC: 9.9 10*3/uL (ref 4.0–10.5)

## 2015-12-16 LAB — BASIC METABOLIC PANEL
ANION GAP: 9 (ref 5–15)
BUN: 10 mg/dL (ref 6–20)
CALCIUM: 8.7 mg/dL — AB (ref 8.9–10.3)
CO2: 22 mmol/L (ref 22–32)
Chloride: 105 mmol/L (ref 101–111)
Creatinine, Ser: 0.49 mg/dL (ref 0.44–1.00)
Glucose, Bld: 115 mg/dL — ABNORMAL HIGH (ref 65–99)
Potassium: 4.1 mmol/L (ref 3.5–5.1)
SODIUM: 136 mmol/L (ref 135–145)

## 2015-12-16 LAB — GLUCOSE, CAPILLARY: Glucose-Capillary: 109 mg/dL — ABNORMAL HIGH (ref 65–99)

## 2015-12-16 MED ORDER — ENOXAPARIN SODIUM 30 MG/0.3ML ~~LOC~~ SOLN: 30.0000 mg | SUBCUTANEOUS | Status: DC

## 2015-12-16 MED ORDER — LEVETIRACETAM 100 MG/ML PO SOLN: 1000.0000 mg | Freq: Two times a day (BID) | ORAL | 11 refills | Status: AC

## 2015-12-16 MED ORDER — MORPHINE SULFATE (CONCENTRATE) 10 MG /0.5 ML PO SOLN: 5.0000 mg | ORAL | 0 refills | Status: AC | PRN

## 2015-12-16 MED ORDER — LORAZEPAM 1 MG PO TABS: 1.0000 mg | ORAL_TABLET | Freq: Four times a day (QID) | ORAL | Status: AC | PRN

## 2015-12-16 MED ORDER — SODIUM CHLORIDE 0.9 % IV BOLUS (SEPSIS)
500.0000 mL | Freq: Once | INTRAVENOUS | Status: AC
  Administered 2015-12-16: 500 mL via INTRAVENOUS

## 2015-12-16 NOTE — Progress Notes (Addendum)
Paged Kirby,NP about patient's HR constantly ranging in 120's to 130's. Order for 12-lead EKG placed. EKG results were sinus tachycardia. Kirby,NP paged about results. Order for 546m bolus placed. Bolus given. HR now in 90's to low 100's. Will continue to monitor and treat per MD orders.

## 2015-12-16 NOTE — Progress Notes (Addendum)
Called report to nurse Levada Dy at [facility]. Reviewed history, current condition, vitals, and most recent test results. Patient set to discharge via ambulance at 1430.   Update: [name] service has arrived to transport patient to hospice. Provided with DNR order for transport. Patient in no distress at time of discharge. [place]

## 2015-12-16 NOTE — Progress Notes (Signed)
Palliative Medicine RN Note: Order rec'd; pt screened. Discussed with Dr Hilma Favors.   Per heme-onc note late yesterday (Dr. Truitt Merle), patient and family would like transfer/referral to [facility]. Also per Dr Ernestina Penna note, if pt does not meet qualifications for BP, they will take her home with hospice. Discussed [facility]. Entered order for hospice referral per Dr Ernestina Penna instructions in the progress note.   Cancelled PMT order per Dr Hilma Favors, as Dr Burr Medico addressed reasons PMT was consulted. If new needs arise, please re-consult PMT.  Marjie Skiff Jcion Buddenhagen, RN, BSN, [facility] 12/16/2015 10:38 AM Cell [phone] 8:00-4:00 Monday-Friday Office [phone]

## 2015-12-16 NOTE — Progress Notes (Signed)
Patient will DC to: [facility] Anticipated DC date: 12/16/15 Family notified: Granddaughter Transport by: Corey Harold 2:30pm   Per MD patient ready for DC to [facility]. RN, patient, patient's family, and facility notified of DC. Discharge Summary sent to facility. RN given number for report. DC packet on chart. Ambulance transport requested for patient.   CSW signing off.  Cedric Fishman, [place] Social Worker [phone]

## 2015-12-16 NOTE — Progress Notes (Signed)
PROGRESS NOTE        PATIENT DETAILS Name: Rose [name] Age: 65 y.o. Sex: female Date of Birth: 1950-08-26 Admit Date: 12/15/2015 Admitting Physician Elmarie Shiley, MD DVV:OHYW L Dean, MD  Brief Narrative: Patient is a 65 y.o. 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 Burr Medico, breast cancer 1992, HTN. Presented to the ED 11/14 with mental status change, worsening fatigue, dizziness, and multiple episodes of fall at home. CT head scan confirmed known widespread intraparenchymal brain metastasis. Patient and family agree with hospice care per Dr. Burr Medico recommendation. Life expectancy several days to few weeks. Admitted to hospital for supportive care. Determining eligibility for inpatient hospice, otherwise home hospice per family.  Subjective: Patient is laying comfortably in bed with no acute complaints.   Denies chest pain, SOB, cough, wheeze, nausea, vomiting, pain.  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 Keppra increased to 1000 mg which is being continued, continue Decadron. Appreciate oncology evaluation, will await recommendations from hospice to see if patient is a candidate for residential hospice.  Dehydration / hemoconcentration:  Hgb 17.3 -> 11.0 with IVF. Discontinue IVF.   Hyperkalemia: unclear etiology. Was not on supplement. ?tumor lysis. Resolved with IV fluids, IV insulin, Amp D 50, Kayexalate, Renal diet.  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 Burr Medico visited patient in ED. Patient and family agreed with inpatient or home hospice care. Determining eligibility for inpatient hospice. Supportive care for now. Limit lab draw.   DVT Prophylaxis: Lovenox  Code Status: DNR / DNI  Family Communication: None at bedside currently  Disposition Plan: Supportive care with  telemetry. Determining inpatient hospice eligibility.   Antimicrobial agents: None  Procedures: None  CONSULTS: Oncology Hospice  Time spent: 25 minutes  MEDICATIONS: Anti-infectives    None      Scheduled Meds: . aspirin EC  81 mg Oral BH-q7a  . dexamethasone  4 mg Oral BID  . enoxaparin (LOVENOX) injection  40 mg Subcutaneous Q24H  . ferrous sulfate  325 mg Oral BID  . levETIRAcetam  1,000 mg Intravenous Q12H  . megestrol  800 mg Oral Daily  . mirtazapine  15 mg Oral QHS  . senna  1 tablet Oral BID  . sodium chloride flush  3 mL Intravenous Q12H   Continuous Infusions: . sodium chloride 125 mL/hr at 12/16/15 0639   PRN Meds:.albuterol, HYDROcodone-acetaminophen, ondansetron **OR** ondansetron (ZOFRAN) IV, prochlorperazine   PHYSICAL EXAM: Vital signs: Vitals:   12/15/15 1800 12/15/15 2117 12/16/15 0536 12/16/15 0742  BP: 109/56 125/71 (!) 114/58 (!) 118/59  Pulse: (!) 121 (!) 127 92 (!) 102  Resp: '21 18 18 17  '$ Temp:  99.9 F (37.7 C) 99.2 F (37.3 C) 98.1 F (36.7 C)  TempSrc:  Oral Oral Oral  SpO2: 96% 100% 98% 100%  Weight:      Height:       Filed Weights   12/15/15 1339  Weight: 44 kg (97 lb)   Body mass index is 18.33 kg/m.   General appearance :Awake, alert, no acute distress. Speech Clear. Eyes:, pupils equally reactive to light and accomodation,no scleral icterus.Pink conjunctiva HEENT: Atraumatic and Normocephalic Neck: supple, no JVD. No cervical lymphadenopathy. No thyromegaly Resp:Good air entry bilaterally, no added sounds  CVS: S1 S2 regular,  no murmurs.  GI: Bowel sounds present, Non tender and not distended with no gaurding, rigidity or rebound.No organomegaly Extremities: B/L Lower Ext shows no edema, both legs are warm to touch Neurology:  speech clear,Non focal, sensation is grossly intact. Psychiatric: Normal judgment and insight. Alert and oriented x 3. Normal mood. Musculoskeletal: No digital cyanosis Skin:No Rash, warm  and dry Wounds:N/A  I have personally reviewed following labs and imaging studies  LABORATORY DATA: CBC:  Recent Labs Lab 12/15/15 1404 12/15/15 1418 12/16/15 0507  WBC 11.5*  --  9.9  NEUTROABS 8.8*  --   --   HGB 15.8* 17.3* 11.0*  HCT 46.9* 51.0* 34.8*  MCV 85.4  --  86.6  PLT 467*  --  443*    Basic Metabolic Panel:  Recent Labs Lab 12/15/15 1404 12/15/15 1418 12/15/15 1710 12/15/15 1901 12/16/15 0507  NA 133* 131* 134* 137 136  K 6.4* 6.5* 4.7 3.7 4.1  CL 95* 99* 102 102 105  CO2 25  --  21* 21* 22  GLUCOSE 87 83 87 79 115*  BUN 19 31* '16 12 10  '$ CREATININE 0.63 0.50 0.53 0.61 0.49  CALCIUM 11.0*  --  9.8 10.1 8.7*    GFR: Estimated Creatinine Clearance: 48.7 mL/min (by C-G formula based on SCr of 0.49 mg/dL).  Liver Function Tests:  Recent Labs Lab 12/15/15 1404 12/15/15 1710  AST 29 15  ALT 18 14  ALKPHOS 104 85  BILITOT 1.8* 0.9  PROT 8.4* 6.5  ALBUMIN 3.2* 2.7*   No results for input(s): LIPASE, AMYLASE in the last 168 hours.  Recent Labs Lab 12/15/15 1901  AMMONIA 11    Coagulation Profile:  Recent Labs Lab 12/15/15 1404  INR 1.08    Cardiac Enzymes:  Recent Labs Lab 12/15/15 1404  CKTOTAL 59    BNP (last 3 results) No results for input(s): PROBNP in the last 8760 hours.  HbA1C: No results for input(s): HGBA1C in the last 72 hours.  CBG:  Recent Labs Lab 12/15/15 1752 12/16/15 0243  GLUCAP 137* 109*    Lipid Profile: No results for input(s): CHOL, HDL, LDLCALC, TRIG, CHOLHDL, LDLDIRECT in the last 72 hours.  Thyroid Function Tests: No results for input(s): TSH, T4TOTAL, FREET4, T3FREE, THYROIDAB in the last 72 hours.  Anemia Panel: No results for input(s): VITAMINB12, FOLATE, FERRITIN, TIBC, IRON, RETICCTPCT in the last 72 hours.  Urine analysis:    Component Value Date/Time   COLORURINE YELLOW 12/15/2015 1500   APPEARANCEUR CLEAR 12/15/2015 1500   LABSPEC 1.016 12/15/2015 1500   PHURINE 7.0  12/15/2015 1500   GLUCOSEU NEGATIVE 12/15/2015 1500   HGBUR NEGATIVE 12/15/2015 1500   HGBUR negative 04/07/2010 1033   BILIRUBINUR NEGATIVE 12/15/2015 1500   KETONESUR NEGATIVE 12/15/2015 1500   PROTEINUR NEGATIVE 12/15/2015 1500   UROBILINOGEN 0.2 11/21/2011 1124   NITRITE NEGATIVE 12/15/2015 1500   LEUKOCYTESUR NEGATIVE 12/15/2015 1500    Sepsis Labs: Lactic Acid, Venous    Component Value Date/Time   LATICACIDVEN 1.56 12/15/2015 1704    MICROBIOLOGY: No results found for this or any previous visit (from the past 240 hour(s)).  RADIOLOGY STUDIES/RESULTS: Dg Chest 2 View  Result Date: 11/26/2015 CLINICAL DATA:  Short of breath EXAM: CHEST  2 VIEW COMPARISON:  11/16/2015.  Chest CT 10/20/2015 FINDINGS: Right upper lobe nodule slightly more prominent. Right suprahilar nodule slightly more prominent. Findings suspicious for carcinoma. Possible new nodule left upper lobe. Negative for acute pneumonia.  No heart failure or effusion.  Surgical clips right axilla.  No acute skeletal abnormality. IMPRESSION: Right upper lobe and right suprahilar nodules slightly more prominent. This could be due to infection or neoplasm. Possible left upper lobe nodule is new. Repeat chest CT with contrast recommended at this time. Electronically Signed   By: Franchot Gallo M.D.   On: 11/26/2015 13:29   Dg Chest 2 View  Result Date: 11/16/2015 CLINICAL DATA:  Dyspnea, dry cough, sternal chest pain and right-sided neck and shoulder pain over 3 weeks. History of breast cancer and lung cancer. EXAM: CHEST  2 VIEW COMPARISON:  Chest radiograph of 10/20/2015 and chest CT 10/20/2015 FINDINGS: New area of pulmonary consolidation suggested adjacent to known right upper lobe mass. Otherwise, the lungs appear unremarkable. Heart is normal in size. Right paratracheal soft tissue prominence consistent with known mediastinal adenopathy. The thoracic aorta is slightly tortuous without aneurysm. Atherosclerosis is noted of  the thoracic aortic arch. Axillary clips are seen on the right. IMPRESSION: New area of pulmonary consolidation adjacent to and partially obscuring known right upper lobe mass. Post obstructive pulmonary consolidation or potentially pneumonia in the right upper lobe might account this appearance. Electronically Signed   By: Ashley Royalty M.D.   On: 11/16/2015 17:47   Ct Head Wo Contrast  Result Date: 12/15/2015 CLINICAL DATA:  Right upper extremity weakness for 1 day. History of lung carcinoma EXAM: CT HEAD WITHOUT CONTRAST TECHNIQUE: Contiguous axial images were obtained from the base of the skull through the vertex without intravenous contrast. COMPARISON:  Head CT May 11, 2015 and brain MRI December 04, 2015 FINDINGS: Brain: There is age related atrophy. There are multiple foci of vasogenic edema throughout the brain, not appreciably changed from recent MR. There is edema with known tumor in the right frontal lobe which extends to but does not efface the frontal horn of the right lateral ventricle. There is extensive vasogenic edema in the posterior temporal region. There is edema in the superior right occipital region as well as in the medial left parietal and occipital region. Contrast enhanced MR shows several masses in these areas. There is a focal area of increased attenuation in the head of the caudate nucleus on the right consistent with a mass. A second area of increased attenuation in the superior mid left temporal lobe is consistent with a mass. The largest of these lesions which is seen on noncontrast enhanced study measures 1 cm in the left temporal lobe. There may well be larger masses which cannot be well delineated there is a subtle mass in the superior anterior anterior left thalamus with mild surrounding edema. There is prominence in the upper cervical cord region where it masses noted on recent MR occupying much of the upper cervical cord. There is no midline shift. There is no subdural or  epidural fluid. No acute hemorrhage is evident. Vascular: No hyperdense vessel. There are foci of calcification in each carotid siphon. Skull: No blastic or lytic bone lesions are demonstrable. No fracture evident. Sinuses/Orbits: Visualized paranasal sinuses show evidence of opacification in the left sphenoid region. No intraorbital lesions are evident. Other: Mastoid air cells are clear. IMPRESSION: Widespread intraparenchymal brain metastatic disease, better delineated on contrast enhanced MR. The overall degree of vasogenic edema at multiple sites is stable. There is no ventricular effacement or midline shift. No acute hemorrhage evident. No obvious new lesions evident, noting that MR with contrast is more sensitive for intraparenchymal lesions than is noncontrast enhanced CT examination. Calcification noted in the carotid siphon  regions. Focal left sphenoid sinus disease. If further evaluation is felt to be warranted, brain MRI pre and post-contrast to directly compare with recent prior study would be the imaging study of choice for further assessment. Electronically Signed   By: Lowella Grip III M.D.   On: 12/15/2015 16:12   Ct Chest W Contrast  Result Date: 11/26/2015 CLINICAL DATA:  Chest pain, right lung cancer EXAM: CT CHEST WITH CONTRAST TECHNIQUE: Multidetector CT imaging of the chest was performed during intravenous contrast administration. CONTRAST:  71m ISOVUE-300 IOPAMIDOL (ISOVUE-300) INJECTION 61% COMPARISON:  10/20/2015 FINDINGS: Cardiovascular: No aortic dissection. No central pulmonary embolus is noted. Heart size within normal limits. Atherosclerotic calcifications of thoracic aorta and coronary arteries. Mediastinum/Nodes: A right supraclavicular lymph node just lateral to thyroid gland measures 3.1 cm stable from prior exam. Right pretracheal lymph node measures 2.6 cm. Precarinal adenopathy measures 2.9 cm. Right hilar lymph node measures 1.4 cm. Lungs/Pleura: Again noted  spiculated mass in right upper lobe measures 2 cm x 1.7 cm. Central airway is patent. Mild emphysematous changes are noted bilateral upper lobe. There is nodular lesion in right upper lobe centrally measures 2 cm. This has progression in size from prior exam. No pleural effusion. No pulmonary edema. No segmental infiltrate. Mild peripheral interstitial prominence is noted in left upper lobe anteriorly axial image 34. New tiny nodule in right apex measures 4.5 mm. Upper Abdomen: Visualized upper abdomen shows a low-density lesion in posterior aspect of left hepatic lobe measures 2.7 cm highly suspicious for metastatic disease. Musculoskeletal: Sagittal images of the spine shows no destructive bony lesions. There are degenerative changes thoracic spine. No destructive rib lesions are noted. IMPRESSION: 1. Again noted significant mediastinal and right hilar adenopathy. Stable spiculated mass in right upper lobe. Progression in size of a second lesion in right upper lobe centrally measures 2 cm. 2. Mild emphysematous changes again noted. No infiltrate or pulmonary edema. 3. There is a low-density lesion in posterior aspect of the left hepatic lobe measures 2.7 cm suspicious for metastatic disease. No aortic aneurysm or dissection. No central pulmonary embolus. Electronically Signed   By: LLahoma CrockerM.D.   On: 11/26/2015 15:19   Mr BJeri CosWTFContrast  Result Date: 12/04/2015 CLINICAL DATA:  Metastatic lung cancer. Six month stereotactic radiosurgery restaging. EXAM: MRI HEAD WITHOUT AND WITH CONTRAST TECHNIQUE: Multiplanar, multiecho pulse sequences of the brain and surrounding structures were obtained without and with intravenous contrast. CONTRAST:  931mMULTIHANCE GADOBENATE DIMEGLUMINE 529 MG/ML IV SOLN COMPARISON:  MRI 08/25/2015 FINDINGS: Brain: Marked progression of widespread metastatic disease to the brain. Multiple new metastatic deposits are present. There is progression of cerebral edema. There is  extensive cerebral edema throughout multiple lesions in both cerebral hemispheres. There is also extensive edema in the cervical spinal cord due to a new metastatic deposit. 10 mm enhancing metastatic deposit in the spinal cord at the C1 level is new with extensive edema. 3 mm lesion right cerebellum axial image 34 is new. Right superior cerebellar lesion has improved in the interval. 5 x 8 mm left superior cerebellar lesion has increased in size. 6 mm right medial temporal lobe lesion is new 10 mm left lateral temporal lobe lesion with surrounding edema is new 3 mm right posterior occipital parietal lobe is new 3 mm right posterior temporal lobe lesion is new 6 mm right posterior medial temporal lobe lesion is new 4 mm right gyrus rectus lesion is new with surrounding edema. 8 mm left medial basal  ganglia lesion with edema is new 10 mm right caudate head lesion with surrounding edema is new. 3 mm and 4 mm right occipital lesions are new with extensive edema. 3 mm left parietal lesion is new 4 mm left parietal operculum lesion is new 7 mm right posterior medial parietal lobe lesion with surrounding edema is new 5 mm and 4 mm lesions in the left medial parietal lobe with surrounding edema are new. 5 mm right frontal cortical lesion is new. 9.5 mm right frontal lobe lesion is new with moderate surrounding edema. 5 mm left medial frontal lobe lesion is new 8 mm left medial frontal lobe lesion appears new. 6 mm left frontal lobe lesion over the convexity. 2 mm left parietal cortical lesion over the convexity 5 mm right parietal lesion over the convexity is new. 7 mm right convexity frontal lobe lesion is new. Multiple treated lesions seen on the prior study have improved in the interval. Ventricle size normal. Small amount of chronic hemorrhage in the right frontal lobe unchanged may be due to metastatic disease. Vascular: Normal flow voids in the circle of Willis. Skull and upper cervical spine: Negative for osseous  metastatic disease. Sinuses/Orbits: Mild mucosal edema paranasal sinuses. No orbital mass. Other: None IMPRESSION: Multiple rapidly enlarging new metastatic deposits in the brain. Approximately 27 new lesions identified. Many of these have edema and mass-effect. Note is made of a 1 cm lesion in the spinal cord at C1 with extensive cord edema. There is mild subfalcine herniation on the right. Multiple previously treated lesions have improved in the interval. Electronically Signed   By: Franchot Gallo M.D.   On: 12/04/2015 13:33     LOS: 1 day   Filiberto Pinks, PA-S  [facility] Pager:[phone]  If 7PM-7AM, please contact night-coverage [email or web address] Password [facility] 12/16/2015, 11:01 AM  Attending MD note  Patient was seen, examined,treatment plan was discussed with the PA-S.  I have personally reviewed the clinical findings, lab, imaging studies and management of this patient in detail. I agree with the documentation, as recorded by the PA-S.   On Exam: Gen. exam: Awake, alert, not in any distress Chest: Good air entry bilaterally, no rhonchi or rales CVS: S1-S2 regular, no murmurs Abdomen: Soft, nontender and nondistended Neurology: Non-focal Skin: No rash or lesions  Plan Await hospice eval-see if patient is a candidate for residential hospice.  Rest as above  [facility]

## 2015-12-16 NOTE — Progress Notes (Signed)
PT Cancellation Note  Patient Details Name: Rose [name] MRN: [id number] DOB: 08-28-1950   Cancelled Treatment:    Reason Eval/Treat Not Completed: PT screened, no needs identified, will sign off. Hospice residential planned.   Claretha Cooper 12/16/2015, 1:51 PM Tresa Endo PT [phone]

## 2015-12-16 NOTE — Progress Notes (Signed)
   Per request, chaplain delivered Advanced Directive documentation.  Pt. requested documentation be left at bedside.  - Rev. [place] MDiv ThM

## 2015-12-16 NOTE — Care Management Note (Signed)
Case Management Note  Patient Details  Name: Flannery Cavallero MRN: [id number] Date of Birth: Sep 21, 1950  Subjective/Objective:                 Patient with widespread metastatic lesions to brain, will DC to residential hospice today as facilitated by CSW.    Action/Plan:   Expected Discharge Date:                  Expected Discharge Plan:  [place]  In-House Referral:  Clinical Social Work  Discharge planning Services  CM Consult  Post Acute Care Choice:    Choice offered to:     DME Arranged:    DME Agency:     HH Arranged:    [place] Agency:     Status of Service:  Completed, signed off  If discussed at [employer], dates discussed:    Additional Comments:  Carles Collet, RN 12/16/2015, 12:53 PM

## 2015-12-16 NOTE — Progress Notes (Signed)
CSW spoke with patient and patient's sister regarding discharge plans. Patient's sister stated that Rose [name], patient's niece, is in charge of decision making. CSW left her a voicemail regarding Hospice.  Percell Locus Rose [name] LCSWA [phone]

## 2015-12-16 NOTE — Discharge Summary (Signed)
PATIENT DETAILS Name: Rose [name] Age: 65 y.o. Sex: female Date of Birth: 06/04/1950 MRN: [id number]. Admitting Physician: Elmarie Shiley, MD [id number] Wilhelmina Mcardle, MD  Admit Date: 12/15/2015 Discharge date: 12/16/2015  Recommendations for Outpatient Follow-up:  1. Optimize comfort care  Admitted From:  Home  Disposition: [place]: No  Equipment/Devices: None  Discharge Condition: Hospice  CODE STATUS: DNR  Diet recommendation:  Regular  Brief Summary: See H&P, Labs, Consult and Test reports for all details in brief, patient did metastatic lung cancer with recent diagnosis of worsening brain metastases and extracranial disease, admitted to the hospital for evaluation on altered mental status.  Brief Hospital Course: Transient altered mental status and right-sided weakness: Probable seizures causing transient confusion and right-sided weakness. Admitted to the hospital, Keppra increased to 1000 mg, she was continued on Decadron. Given her underlying malignancy and worsening metastatic burden, seen by oncology and recommendations were to transition to hospice care.   Metastatic lung cancer: With worsening intracranial and extracranial metastatic disease. Continue Decadron and Keppra for comfort. Seen by oncology, recommendations were to transition to hospice care, subsequently evaluated by hospice team-recommendations are to transition to residential hospice on discharge. Will continue Keppra and Decadron for comfort measures.  Mild hyperkalemia: Resolved with supportive measures.  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)   Anemia, iron deficiency   Hyperkalemia   Discharge Instructions:  Activity:  As tolerated with  Full fall precautions use walker/cane & assistance as needed   Discharge Instructions    Diet - low sodium heart healthy    Complete by:  As directed    Increase activity slowly    Complete by:  As directed        Medication List    STOP taking these medications   acetaminophen-codeine 300-30 MG tablet Commonly known as:  TYLENOL #3   aspirin EC 81 MG tablet   cholecalciferol 1000 units tablet Commonly known as:  VITAMIN D   doxycycline 100 MG capsule Commonly known as:  VIBRAMYCIN   ferrous sulfate 325 (65 FE) MG tablet   HYDROcodone-acetaminophen 5-325 MG tablet Commonly known as:  NORCO   megestrol 625 MG/5ML suspension Commonly known as:  MEGACE ES   mirtazapine 15 MG tablet Commonly known as:  REMERON   ondansetron 8 MG tablet Commonly known as:  ZOFRAN   prochlorperazine 10 MG tablet Commonly known as:  COMPAZINE   zolpidem 5 MG tablet Commonly known as:  AMBIEN     TAKE these medications   albuterol (2.5 MG/3ML) 0.083% nebulizer solution Commonly known as:  PROVENTIL Take 2.5 mg by nebulization every 6 (six) hours as needed for wheezing or shortness of breath. Not sure of concentration   dexamethasone 4 MG tablet Commonly known as:  DECADRON Take 1 tablet (4 mg total) by mouth 2 (two) times daily.   esomeprazole 20 MG packet Commonly known as:  NEXIUM Take 20 mg by mouth daily before breakfast.   levETIRAcetam 100 MG/ML solution Commonly known as:  KEPPRA Take 10 mLs (1,000 mg total) by mouth 2 (two) times daily. What changed:  how much to take   LORazepam 1 MG tablet Commonly known as:  ATIVAN Take 1 tablet (1 mg total) by mouth every 6 (six)  hours as needed for anxiety.   morphine CONCENTRATE 10 mg / 0.5 ml concentrated solution Take 0.25 mLs (5 mg total) by mouth every 4 (four) hours as needed for severe pain, anxiety or shortness of breath.      Follow-up Information    Rogers Blocker, MD Follow up.   Specialty:  Internal  Medicine Why:  as needed Contact information: [address] [place] [address] [phone]          No Known Allergies  Consultations:   hematology/oncology  Palliative care   Other Procedures/Studies: Dg Chest 2 View  Result Date: 11/26/2015 CLINICAL DATA:  Short of breath EXAM: CHEST  2 VIEW COMPARISON:  11/16/2015.  Chest CT 10/20/2015 FINDINGS: Right upper lobe nodule slightly more prominent. Right suprahilar nodule slightly more prominent. Findings suspicious for carcinoma. Possible new nodule left upper lobe. Negative for acute pneumonia.  No heart failure or effusion. Surgical clips right axilla.  No acute skeletal abnormality. IMPRESSION: Right upper lobe and right suprahilar nodules slightly more prominent. This could be due to infection or neoplasm. Possible left upper lobe nodule is new. Repeat chest CT with contrast recommended at this time. Electronically Signed   By: Franchot Gallo M.D.   On: 11/26/2015 13:29   Dg Chest 2 View  Result Date: 11/16/2015 CLINICAL DATA:  Dyspnea, dry cough, sternal chest pain and right-sided neck and shoulder pain over 3 weeks. History of breast cancer and lung cancer. EXAM: CHEST  2 VIEW COMPARISON:  Chest radiograph of 10/20/2015 and chest CT 10/20/2015 FINDINGS: New area of pulmonary consolidation suggested adjacent to known right upper lobe mass. Otherwise, the lungs appear unremarkable. Heart is normal in size. Right paratracheal soft tissue prominence consistent with known mediastinal adenopathy. The thoracic aorta is slightly tortuous without aneurysm. Atherosclerosis is noted of the thoracic aortic arch. Axillary clips are seen on the right. IMPRESSION: New area of pulmonary consolidation adjacent to and partially obscuring known right upper lobe mass. Post obstructive pulmonary consolidation or potentially pneumonia in the right upper lobe might account this appearance. Electronically Signed   By: Ashley Royalty M.D.   On:  11/16/2015 17:47   Ct Head Wo Contrast  Result Date: 12/15/2015 CLINICAL DATA:  Right upper extremity weakness for 1 day. History of lung carcinoma EXAM: CT HEAD WITHOUT CONTRAST TECHNIQUE: Contiguous axial images were obtained from the base of the skull through the vertex without intravenous contrast. COMPARISON:  Head CT May 11, 2015 and brain MRI December 04, 2015 FINDINGS: Brain: There is age related atrophy. There are multiple foci of vasogenic edema throughout the brain, not appreciably changed from recent MR. There is edema with known tumor in the right frontal lobe which extends to but does not efface the frontal horn of the right lateral ventricle. There is extensive vasogenic edema in the posterior temporal region. There is edema in the superior right occipital region as well as in the medial left parietal and occipital region. Contrast enhanced MR shows several masses in these areas. There is a focal area of increased attenuation in the head of the caudate nucleus on the right consistent with a mass. A second area of increased attenuation in the superior mid left temporal lobe is consistent with a mass. The largest of these lesions which is seen on noncontrast enhanced study measures 1 cm in the left temporal lobe. There may well be larger masses which cannot be well delineated there is a subtle mass in the superior anterior  anterior left thalamus with mild surrounding edema. There is prominence in the upper cervical cord region where it masses noted on recent MR occupying much of the upper cervical cord. There is no midline shift. There is no subdural or epidural fluid. No acute hemorrhage is evident. Vascular: No hyperdense vessel. There are foci of calcification in each carotid siphon. Skull: No blastic or lytic bone lesions are demonstrable. No fracture evident. Sinuses/Orbits: Visualized paranasal sinuses show evidence of opacification in the left sphenoid region. No intraorbital lesions are  evident. Other: Mastoid air cells are clear. IMPRESSION: Widespread intraparenchymal brain metastatic disease, better delineated on contrast enhanced MR. The overall degree of vasogenic edema at multiple sites is stable. There is no ventricular effacement or midline shift. No acute hemorrhage evident. No obvious new lesions evident, noting that MR with contrast is more sensitive for intraparenchymal lesions than is noncontrast enhanced CT examination. Calcification noted in the carotid siphon regions. Focal left sphenoid sinus disease. If further evaluation is felt to be warranted, brain MRI pre and post-contrast to directly compare with recent prior study would be the imaging study of choice for further assessment. Electronically Signed   By: Lowella Grip III M.D.   On: 12/15/2015 16:12   Ct Chest W Contrast  Result Date: 11/26/2015 CLINICAL DATA:  Chest pain, right lung cancer EXAM: CT CHEST WITH CONTRAST TECHNIQUE: Multidetector CT imaging of the chest was performed during intravenous contrast administration. CONTRAST:  53m ISOVUE-300 IOPAMIDOL (ISOVUE-300) INJECTION 61% COMPARISON:  10/20/2015 FINDINGS: Cardiovascular: No aortic dissection. No central pulmonary embolus is noted. Heart size within normal limits. Atherosclerotic calcifications of thoracic aorta and coronary arteries. Mediastinum/Nodes: A right supraclavicular lymph node just lateral to thyroid gland measures 3.1 cm stable from prior exam. Right pretracheal lymph node measures 2.6 cm. Precarinal adenopathy measures 2.9 cm. Right hilar lymph node measures 1.4 cm. Lungs/Pleura: Again noted spiculated mass in right upper lobe measures 2 cm x 1.7 cm. Central airway is patent. Mild emphysematous changes are noted bilateral upper lobe. There is nodular lesion in right upper lobe centrally measures 2 cm. This has progression in size from prior exam. No pleural effusion. No pulmonary edema. No segmental infiltrate. Mild peripheral interstitial  prominence is noted in left upper lobe anteriorly axial image 34. New tiny nodule in right apex measures 4.5 mm. Upper Abdomen: Visualized upper abdomen shows a low-density lesion in posterior aspect of left hepatic lobe measures 2.7 cm highly suspicious for metastatic disease. Musculoskeletal: Sagittal images of the spine shows no destructive bony lesions. There are degenerative changes thoracic spine. No destructive rib lesions are noted. IMPRESSION: 1. Again noted significant mediastinal and right hilar adenopathy. Stable spiculated mass in right upper lobe. Progression in size of a second lesion in right upper lobe centrally measures 2 cm. 2. Mild emphysematous changes again noted. No infiltrate or pulmonary edema. 3. There is a low-density lesion in posterior aspect of the left hepatic lobe measures 2.7 cm suspicious for metastatic disease. No aortic aneurysm or dissection. No central pulmonary embolus. Electronically Signed   By: LLahoma CrockerM.D.   On: 11/26/2015 15:19   Mr BJeri CosWEGContrast  Result Date: 12/04/2015 CLINICAL DATA:  Metastatic lung cancer. Six month stereotactic radiosurgery restaging. EXAM: MRI HEAD WITHOUT AND WITH CONTRAST TECHNIQUE: Multiplanar, multiecho pulse sequences of the brain and surrounding structures were obtained without and with intravenous contrast. CONTRAST:  928mMULTIHANCE GADOBENATE DIMEGLUMINE 529 MG/ML IV SOLN COMPARISON:  MRI 08/25/2015 FINDINGS: Brain: Marked progression of widespread  metastatic disease to the brain. Multiple new metastatic deposits are present. There is progression of cerebral edema. There is extensive cerebral edema throughout multiple lesions in both cerebral hemispheres. There is also extensive edema in the cervical spinal cord due to a new metastatic deposit. 10 mm enhancing metastatic deposit in the spinal cord at the C1 level is new with extensive edema. 3 mm lesion right cerebellum axial image 34 is new. Right superior cerebellar lesion has  improved in the interval. 5 x 8 mm left superior cerebellar lesion has increased in size. 6 mm right medial temporal lobe lesion is new 10 mm left lateral temporal lobe lesion with surrounding edema is new 3 mm right posterior occipital parietal lobe is new 3 mm right posterior temporal lobe lesion is new 6 mm right posterior medial temporal lobe lesion is new 4 mm right gyrus rectus lesion is new with surrounding edema. 8 mm left medial basal ganglia lesion with edema is new 10 mm right caudate head lesion with surrounding edema is new. 3 mm and 4 mm right occipital lesions are new with extensive edema. 3 mm left parietal lesion is new 4 mm left parietal operculum lesion is new 7 mm right posterior medial parietal lobe lesion with surrounding edema is new 5 mm and 4 mm lesions in the left medial parietal lobe with surrounding edema are new. 5 mm right frontal cortical lesion is new. 9.5 mm right frontal lobe lesion is new with moderate surrounding edema. 5 mm left medial frontal lobe lesion is new 8 mm left medial frontal lobe lesion appears new. 6 mm left frontal lobe lesion over the convexity. 2 mm left parietal cortical lesion over the convexity 5 mm right parietal lesion over the convexity is new. 7 mm right convexity frontal lobe lesion is new. Multiple treated lesions seen on the prior study have improved in the interval. Ventricle size normal. Small amount of chronic hemorrhage in the right frontal lobe unchanged may be due to metastatic disease. Vascular: Normal flow voids in the circle of Willis. Skull and upper cervical spine: Negative for osseous metastatic disease. Sinuses/Orbits: Mild mucosal edema paranasal sinuses. No orbital mass. Other: None IMPRESSION: Multiple rapidly enlarging new metastatic deposits in the brain. Approximately 27 new lesions identified. Many of these have edema and mass-effect. Note is made of a 1 cm lesion in the spinal cord at C1 with extensive cord edema. There is mild  subfalcine herniation on the right. Multiple previously treated lesions have improved in the interval. Electronically Signed   By: Franchot Gallo M.D.   On: 12/04/2015 13:33      TODAY-DAY OF DISCHARGE:  Subjective:   Rose [name] today has no headache,no chest abdominal pain,no new weakness tingling or numbness  Objective:   Blood pressure (!) 118/59, pulse (!) 102, temperature 98.1 F (36.7 C), temperature source Oral, resp. rate 17, height '5\' 1"'$  (1.549 m), weight 44 kg (97 lb), SpO2 100 %.  Intake/Output Summary (Last 24 hours) at 12/16/15 1237 Last data filed at 12/16/15 1140  Gross per 24 hour  Intake           2787.5 ml  Output             1250 ml  Net           1537.5 ml   Filed Weights   12/15/15 1339  Weight: 44 kg (97 lb)    Exam: Awake Alert,  No new F.N deficits, Normal affect Joice.AT,PERRAL Supple Neck,No  JVD, No cervical lymphadenopathy appriciated.  Symmetrical Chest wall movement, Good air movement bilaterally, CTAB RRR,No Gallops,Rubs or new Murmurs, No Parasternal Heave +ve B.Sounds, Abd Soft, Non tender, No organomegaly appriciated, No rebound -guarding or rigidity. No Cyanosis, Clubbing or edema, No new Rash or bruise   PERTINENT RADIOLOGIC STUDIES: Dg Chest 2 View  Result Date: 11/26/2015 CLINICAL DATA:  Short of breath EXAM: CHEST  2 VIEW COMPARISON:  11/16/2015.  Chest CT 10/20/2015 FINDINGS: Right upper lobe nodule slightly more prominent. Right suprahilar nodule slightly more prominent. Findings suspicious for carcinoma. Possible new nodule left upper lobe. Negative for acute pneumonia.  No heart failure or effusion. Surgical clips right axilla.  No acute skeletal abnormality. IMPRESSION: Right upper lobe and right suprahilar nodules slightly more prominent. This could be due to infection or neoplasm. Possible left upper lobe nodule is new. Repeat chest CT with contrast recommended at this time. Electronically Signed   By: Franchot Gallo M.D.   On:  11/26/2015 13:29   Dg Chest 2 View  Result Date: 11/16/2015 CLINICAL DATA:  Dyspnea, dry cough, sternal chest pain and right-sided neck and shoulder pain over 3 weeks. History of breast cancer and lung cancer. EXAM: CHEST  2 VIEW COMPARISON:  Chest radiograph of 10/20/2015 and chest CT 10/20/2015 FINDINGS: New area of pulmonary consolidation suggested adjacent to known right upper lobe mass. Otherwise, the lungs appear unremarkable. Heart is normal in size. Right paratracheal soft tissue prominence consistent with known mediastinal adenopathy. The thoracic aorta is slightly tortuous without aneurysm. Atherosclerosis is noted of the thoracic aortic arch. Axillary clips are seen on the right. IMPRESSION: New area of pulmonary consolidation adjacent to and partially obscuring known right upper lobe mass. Post obstructive pulmonary consolidation or potentially pneumonia in the right upper lobe might account this appearance. Electronically Signed   By: Ashley Royalty M.D.   On: 11/16/2015 17:47   Ct Head Wo Contrast  Result Date: 12/15/2015 CLINICAL DATA:  Right upper extremity weakness for 1 day. History of lung carcinoma EXAM: CT HEAD WITHOUT CONTRAST TECHNIQUE: Contiguous axial images were obtained from the base of the skull through the vertex without intravenous contrast. COMPARISON:  Head CT May 11, 2015 and brain MRI December 04, 2015 FINDINGS: Brain: There is age related atrophy. There are multiple foci of vasogenic edema throughout the brain, not appreciably changed from recent MR. There is edema with known tumor in the right frontal lobe which extends to but does not efface the frontal horn of the right lateral ventricle. There is extensive vasogenic edema in the posterior temporal region. There is edema in the superior right occipital region as well as in the medial left parietal and occipital region. Contrast enhanced MR shows several masses in these areas. There is a focal area of increased  attenuation in the head of the caudate nucleus on the right consistent with a mass. A second area of increased attenuation in the superior mid left temporal lobe is consistent with a mass. The largest of these lesions which is seen on noncontrast enhanced study measures 1 cm in the left temporal lobe. There may well be larger masses which cannot be well delineated there is a subtle mass in the superior anterior anterior left thalamus with mild surrounding edema. There is prominence in the upper cervical cord region where it masses noted on recent MR occupying much of the upper cervical cord. There is no midline shift. There is no subdural or epidural fluid. No acute hemorrhage is evident.  Vascular: No hyperdense vessel. There are foci of calcification in each carotid siphon. Skull: No blastic or lytic bone lesions are demonstrable. No fracture evident. Sinuses/Orbits: Visualized paranasal sinuses show evidence of opacification in the left sphenoid region. No intraorbital lesions are evident. Other: Mastoid air cells are clear. IMPRESSION: Widespread intraparenchymal brain metastatic disease, better delineated on contrast enhanced MR. The overall degree of vasogenic edema at multiple sites is stable. There is no ventricular effacement or midline shift. No acute hemorrhage evident. No obvious new lesions evident, noting that MR with contrast is more sensitive for intraparenchymal lesions than is noncontrast enhanced CT examination. Calcification noted in the carotid siphon regions. Focal left sphenoid sinus disease. If further evaluation is felt to be warranted, brain MRI pre and post-contrast to directly compare with recent prior study would be the imaging study of choice for further assessment. Electronically Signed   By: Lowella Grip III M.D.   On: 12/15/2015 16:12   Ct Chest W Contrast  Result Date: 11/26/2015 CLINICAL DATA:  Chest pain, right lung cancer EXAM: CT CHEST WITH CONTRAST TECHNIQUE:  Multidetector CT imaging of the chest was performed during intravenous contrast administration. CONTRAST:  59m ISOVUE-300 IOPAMIDOL (ISOVUE-300) INJECTION 61% COMPARISON:  10/20/2015 FINDINGS: Cardiovascular: No aortic dissection. No central pulmonary embolus is noted. Heart size within normal limits. Atherosclerotic calcifications of thoracic aorta and coronary arteries. Mediastinum/Nodes: A right supraclavicular lymph node just lateral to thyroid gland measures 3.1 cm stable from prior exam. Right pretracheal lymph node measures 2.6 cm. Precarinal adenopathy measures 2.9 cm. Right hilar lymph node measures 1.4 cm. Lungs/Pleura: Again noted spiculated mass in right upper lobe measures 2 cm x 1.7 cm. Central airway is patent. Mild emphysematous changes are noted bilateral upper lobe. There is nodular lesion in right upper lobe centrally measures 2 cm. This has progression in size from prior exam. No pleural effusion. No pulmonary edema. No segmental infiltrate. Mild peripheral interstitial prominence is noted in left upper lobe anteriorly axial image 34. New tiny nodule in right apex measures 4.5 mm. Upper Abdomen: Visualized upper abdomen shows a low-density lesion in posterior aspect of left hepatic lobe measures 2.7 cm highly suspicious for metastatic disease. Musculoskeletal: Sagittal images of the spine shows no destructive bony lesions. There are degenerative changes thoracic spine. No destructive rib lesions are noted. IMPRESSION: 1. Again noted significant mediastinal and right hilar adenopathy. Stable spiculated mass in right upper lobe. Progression in size of a second lesion in right upper lobe centrally measures 2 cm. 2. Mild emphysematous changes again noted. No infiltrate or pulmonary edema. 3. There is a low-density lesion in posterior aspect of the left hepatic lobe measures 2.7 cm suspicious for metastatic disease. No aortic aneurysm or dissection. No central pulmonary embolus. Electronically Signed    By: LLahoma CrockerM.D.   On: 11/26/2015 15:19   Mr BJeri CosWJKContrast  Result Date: 12/04/2015 CLINICAL DATA:  Metastatic lung cancer. Six month stereotactic radiosurgery restaging. EXAM: MRI HEAD WITHOUT AND WITH CONTRAST TECHNIQUE: Multiplanar, multiecho pulse sequences of the brain and surrounding structures were obtained without and with intravenous contrast. CONTRAST:  938mMULTIHANCE GADOBENATE DIMEGLUMINE 529 MG/ML IV SOLN COMPARISON:  MRI 08/25/2015 FINDINGS: Brain: Marked progression of widespread metastatic disease to the brain. Multiple new metastatic deposits are present. There is progression of cerebral edema. There is extensive cerebral edema throughout multiple lesions in both cerebral hemispheres. There is also extensive edema in the cervical spinal cord due to a new metastatic deposit. 10 mm  enhancing metastatic deposit in the spinal cord at the C1 level is new with extensive edema. 3 mm lesion right cerebellum axial image 34 is new. Right superior cerebellar lesion has improved in the interval. 5 x 8 mm left superior cerebellar lesion has increased in size. 6 mm right medial temporal lobe lesion is new 10 mm left lateral temporal lobe lesion with surrounding edema is new 3 mm right posterior occipital parietal lobe is new 3 mm right posterior temporal lobe lesion is new 6 mm right posterior medial temporal lobe lesion is new 4 mm right gyrus rectus lesion is new with surrounding edema. 8 mm left medial basal ganglia lesion with edema is new 10 mm right caudate head lesion with surrounding edema is new. 3 mm and 4 mm right occipital lesions are new with extensive edema. 3 mm left parietal lesion is new 4 mm left parietal operculum lesion is new 7 mm right posterior medial parietal lobe lesion with surrounding edema is new 5 mm and 4 mm lesions in the left medial parietal lobe with surrounding edema are new. 5 mm right frontal cortical lesion is new. 9.5 mm right frontal lobe lesion is new with  moderate surrounding edema. 5 mm left medial frontal lobe lesion is new 8 mm left medial frontal lobe lesion appears new. 6 mm left frontal lobe lesion over the convexity. 2 mm left parietal cortical lesion over the convexity 5 mm right parietal lesion over the convexity is new. 7 mm right convexity frontal lobe lesion is new. Multiple treated lesions seen on the prior study have improved in the interval. Ventricle size normal. Small amount of chronic hemorrhage in the right frontal lobe unchanged may be due to metastatic disease. Vascular: Normal flow voids in the circle of Willis. Skull and upper cervical spine: Negative for osseous metastatic disease. Sinuses/Orbits: Mild mucosal edema paranasal sinuses. No orbital mass. Other: None IMPRESSION: Multiple rapidly enlarging new metastatic deposits in the brain. Approximately 27 new lesions identified. Many of these have edema and mass-effect. Note is made of a 1 cm lesion in the spinal cord at C1 with extensive cord edema. There is mild subfalcine herniation on the right. Multiple previously treated lesions have improved in the interval. Electronically Signed   By: Franchot Gallo M.D.   On: 12/04/2015 13:33     PERTINENT LAB RESULTS: CBC:  Recent Labs  12/15/15 1404 12/15/15 1418 12/16/15 0507  WBC 11.5*  --  9.9  HGB 15.8* 17.3* 11.0*  HCT 46.9* 51.0* 34.8*  PLT 467*  --  443*   CMET CMP     Component Value Date/Time   NA 136 12/16/2015 0507   NA 139 12/04/2015 0917   K 4.1 12/16/2015 0507   K 3.7 12/04/2015 0917   CL 105 12/16/2015 0507   CO2 22 12/16/2015 0507   CO2 24 12/04/2015 0917   GLUCOSE 115 (H) 12/16/2015 0507   GLUCOSE 121 12/04/2015 0917   BUN 10 12/16/2015 0507   BUN 8.7 12/04/2015 0917   CREATININE 0.49 12/16/2015 0507   CREATININE 0.6 12/04/2015 0917   CALCIUM 8.7 (L) 12/16/2015 0507   CALCIUM 10.3 12/04/2015 0917   PROT 6.5 12/15/2015 1710   PROT 8.9 (H) 12/04/2015 0917   ALBUMIN 2.7 (L) 12/15/2015 1710    ALBUMIN 2.6 (L) 12/04/2015 0917   AST 15 12/15/2015 1710   AST 9 12/04/2015 0917   ALT 14 12/15/2015 1710   ALT 10 12/04/2015 0917   ALKPHOS 85 12/15/2015 1710  ALKPHOS 146 12/04/2015 0917   BILITOT 0.9 12/15/2015 1710   BILITOT 0.64 12/04/2015 0917   GFRNONAA >60 12/16/2015 0507   GFRAA >60 12/16/2015 0507    GFR Estimated Creatinine Clearance: 48.7 mL/min (by C-G formula based on SCr of 0.49 mg/dL). No results for input(s): LIPASE, AMYLASE in the last 72 hours.  Recent Labs  12/15/15 1404  CKTOTAL 35   Invalid input(s): POCBNP No results for input(s): DDIMER in the last 72 hours. No results for input(s): HGBA1C in the last 72 hours. No results for input(s): CHOL, HDL, LDLCALC, TRIG, CHOLHDL, LDLDIRECT in the last 72 hours. No results for input(s): TSH, T4TOTAL, T3FREE, THYROIDAB in the last 72 hours.  Invalid input(s): FREET3 No results for input(s): VITAMINB12, FOLATE, FERRITIN, TIBC, IRON, RETICCTPCT in the last 72 hours. Coags:  Recent Labs  12/15/15 1404  INR 1.08   Microbiology: No results found for this or any previous visit (from the past 240 hour(s)).  FURTHER DISCHARGE INSTRUCTIONS:  Get Medicines reviewed and adjusted: Please take all your medications with you for your next visit with your Primary MD  Laboratory/radiological data: Please request your Primary MD to go over all hospital tests and procedure/radiological results at the follow up, please ask your Primary MD to get all Hospital records sent to his/her office.  In some cases, they will be blood work, cultures and biopsy results pending at the time of your discharge. Please request that your primary care M.D. goes through all the records of your hospital data and follows up on these results.  Also Note the following: If you experience worsening of your admission symptoms, develop shortness of breath, life threatening emergency, suicidal or homicidal thoughts you must seek medical attention  immediately by calling 911 or calling your MD immediately  if symptoms less severe.  You must read complete instructions/literature along with all the possible adverse reactions/side effects for all the Medicines you take and that have been prescribed to you. Take any new Medicines after you have completely understood and accpet all the possible adverse reactions/side effects.   Do not drive when taking Pain medications or sleeping medications (Benzodaizepines)  Do not take more than prescribed Pain, Sleep and Anxiety Medications. It is not advisable to combine anxiety,sleep and pain medications without talking with your primary care practitioner  Special Instructions: If you have smoked or chewed Tobacco  in the last 2 yrs please stop smoking, stop any regular Alcohol  and or any Recreational drug use.  Wear Seat belts while driving.  Please note: You were cared for by a hospitalist during your hospital stay. Once you are discharged, your primary care physician will handle any further medical issues. Please note that NO REFILLS for any discharge medications will be authorized once you are discharged, as it is imperative that you return to your primary care physician (or establish a relationship with a primary care physician if you do not have one) for your post hospital discharge needs so that they can reassess your need for medications and monitor your lab values.  Total Time spent coordinating discharge including counseling, education and face to face time equals 25  minutes.  SignedOren Binet 12/16/2015 12:37 PM

## 2015-12-17 ENCOUNTER — Telehealth: Payer: Self-pay | Admitting: Radiation Oncology

## 2015-12-17 NOTE — Telephone Encounter (Signed)
error 

## 2015-12-18 ENCOUNTER — Ambulatory Visit: Payer: BLUE CROSS/BLUE SHIELD | Admitting: Nurse Practitioner

## 2015-12-18 ENCOUNTER — Other Ambulatory Visit: Payer: BLUE CROSS/BLUE SHIELD

## 2015-12-18 ENCOUNTER — Ambulatory Visit: Payer: BLUE CROSS/BLUE SHIELD

## 2015-12-29 ENCOUNTER — Telehealth: Payer: Self-pay | Admitting: *Deleted

## 2015-12-29 NOTE — Telephone Encounter (Signed)
FYI Called patient's sister (Rose [name]) as ordered by Dr. Burr Medico.  "There is no emergency or changes with Rose [name].  My sister Rose [name] and I go daily to visit Rose [name].  She is not improving but when we talk to her she answers correctly.  Rose [name] is trying to take over.  We're putting her out the house today there's nothing she can do about it.  Both Rose [name]'s children have passed, she has two grandchildren and Rose [name] is next in line."  Dr. Burr Medico does not need to do anything at this time."

## 2015-12-29 NOTE — Telephone Encounter (Addendum)
"  This is N'Dea Chana Bode calling for my grandmother's doctor.  I am in the chart.  I need a call as soon as possible.  This is an emergency.  I need a letter to indicate she is incompetent based on her diagnosis.  Return number [phone]."

## 2015-12-30 ENCOUNTER — Telehealth: Payer: Self-pay | Admitting: *Deleted

## 2015-12-30 NOTE — Telephone Encounter (Signed)
Rose [name] called requesting a "discharge summary" about Rose [name]'s condition.   Her phone number is [phone].    Please see phone notes from last two days before calling.

## 2015-12-30 NOTE — Telephone Encounter (Addendum)
FYI Received call @ [phone] from pt Rose [name] pt granddaughter, she asked if Dr.Feng could give her a call @ [phone] in regards to pt. PLEASE LOOK OVER NOTES FROM 11/28 BEFORE CALLING!

## 2016-01-01 ENCOUNTER — Encounter: Payer: Self-pay | Admitting: Nurse Practitioner

## 2016-02-01 DEATH — deceased

## 2016-03-18 ENCOUNTER — Ambulatory Visit: Payer: BLUE CROSS/BLUE SHIELD | Admitting: Nurse Practitioner

## 2016-09-26 ENCOUNTER — Encounter: Payer: Self-pay | Admitting: Radiation Therapy

## 2016-09-26 NOTE — Progress Notes (Signed)
Ms. Alcindor passed away on 01-20-16 per [facility] and [place].   Mont Dutton R.T.(R)(T) Special Procedures Navigator

## 2016-11-11 ENCOUNTER — Other Ambulatory Visit: Payer: Self-pay | Admitting: Nurse Practitioner

## 2018-06-28 IMAGING — US US THYROID
1 series · 13 of 25 positions shown · non-contrast
Comparison: CT 08/10/2015, PET-CT 06/10/2015

CLINICAL DATA: 64-year-old female with a history of palpable right
neck mass. Lung cancer.

EXAM:
THYROID ULTRASOUND
TECHNIQUE: Ultrasound examination of the thyroid gland and adjacent soft
tissues was performed.

[Series 1: us thyroid · 0.06mm/px · 13 of 57 slices shown]
[im 1/57]
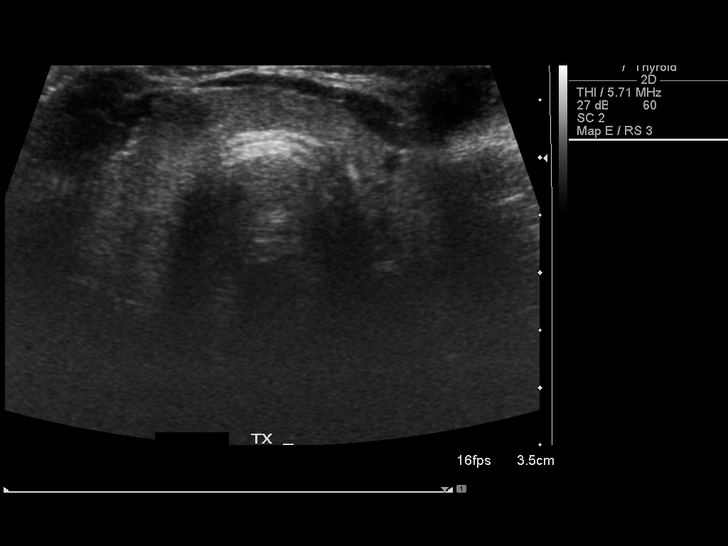
[im 5/57]
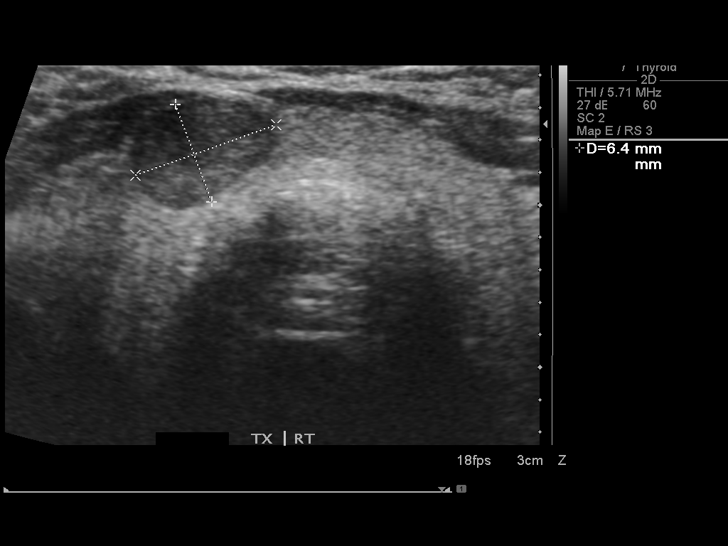
[im 10/57]
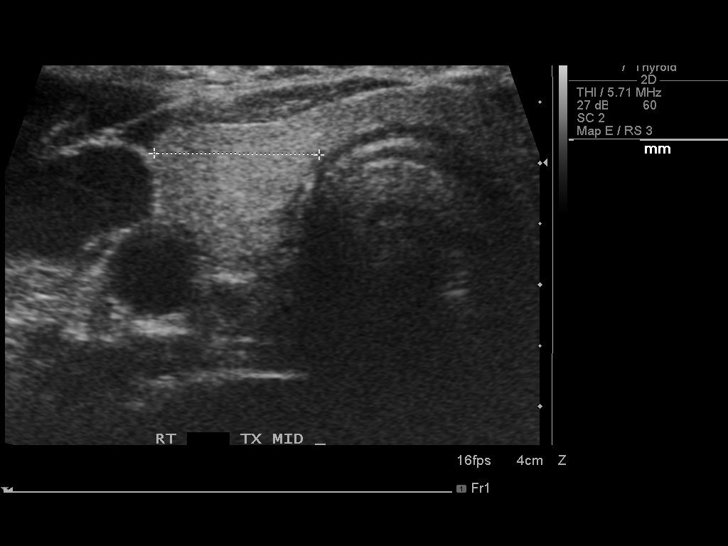
[im 15/57]
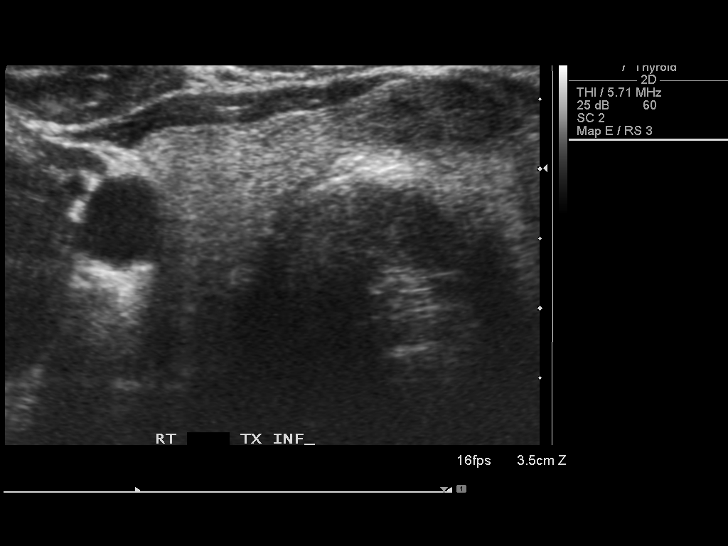
[im 19/57]
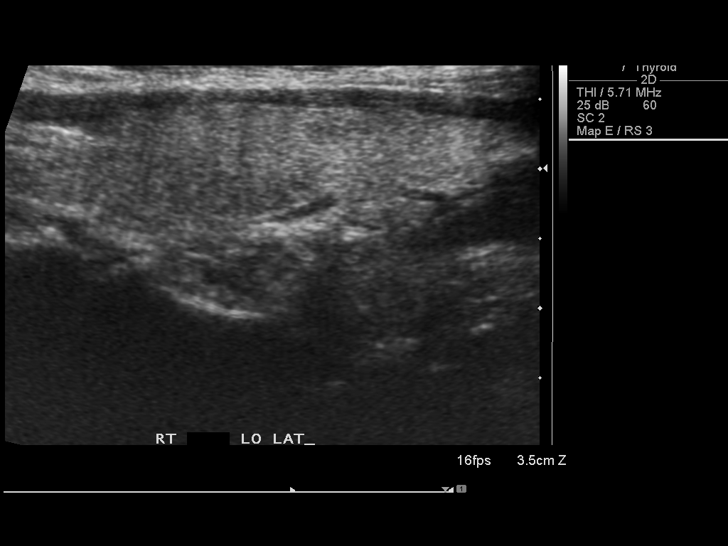
[im 24/57]
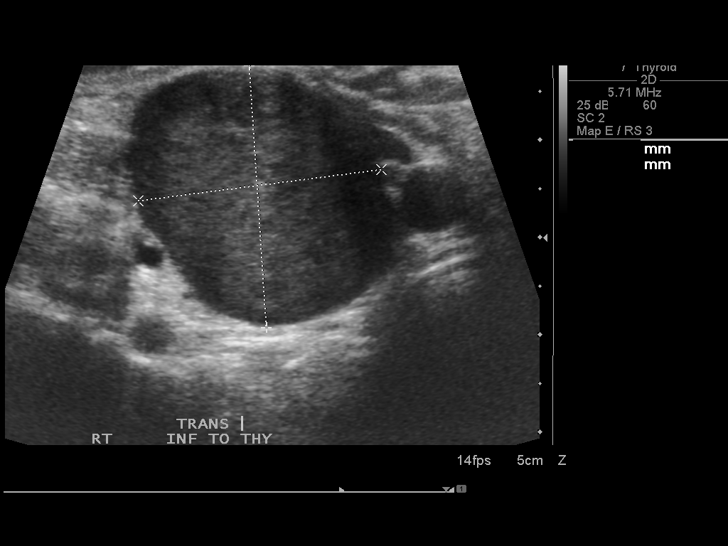
[im 29/57]
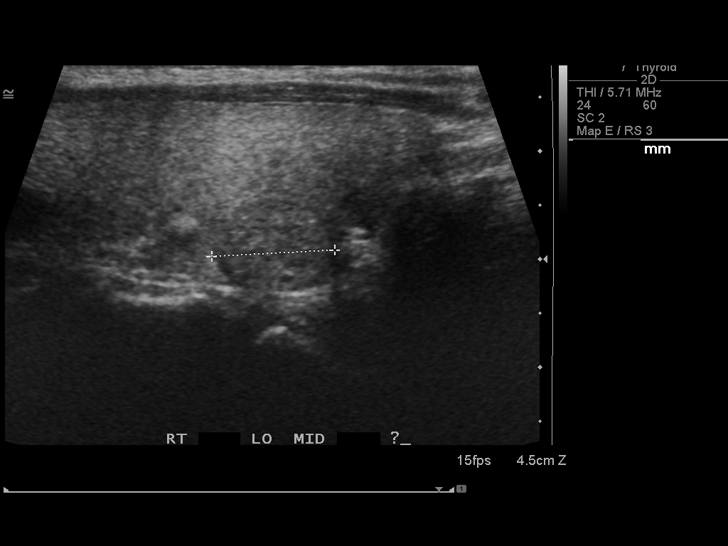
[im 33/57]
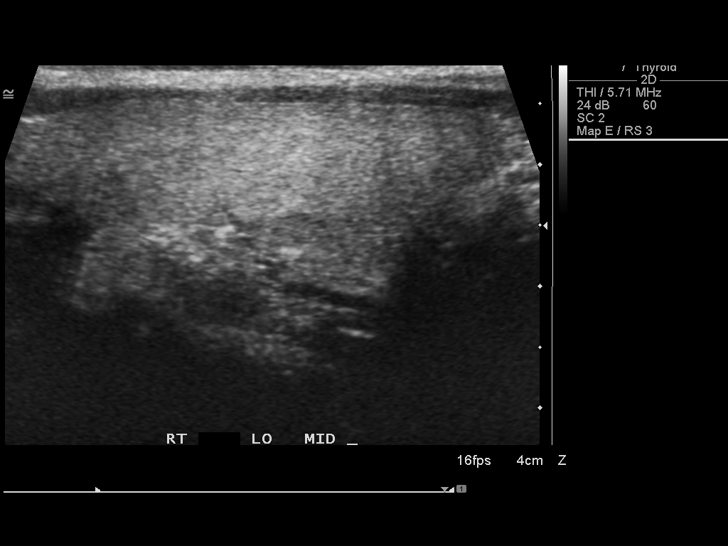
[im 38/57]
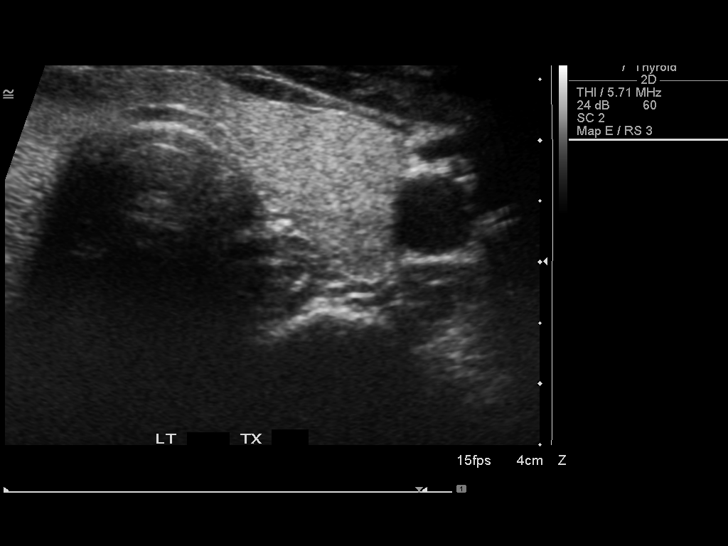
[im 43/57]
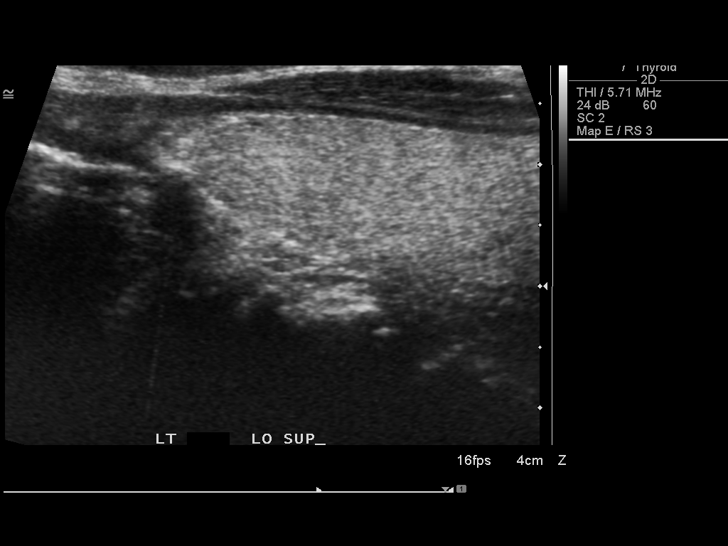
[im 47/57]
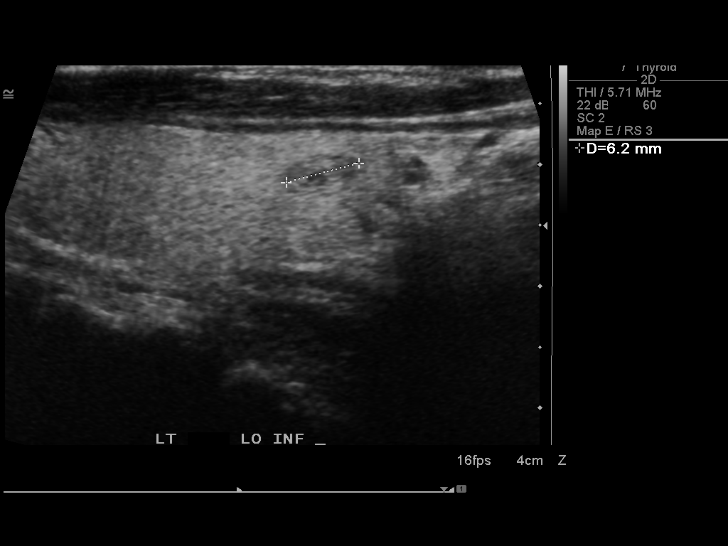
[im 52/57]
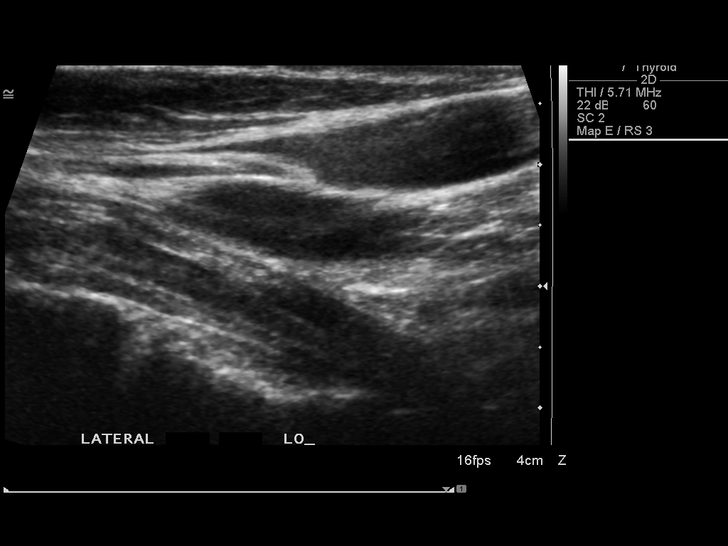
[im 57/57]
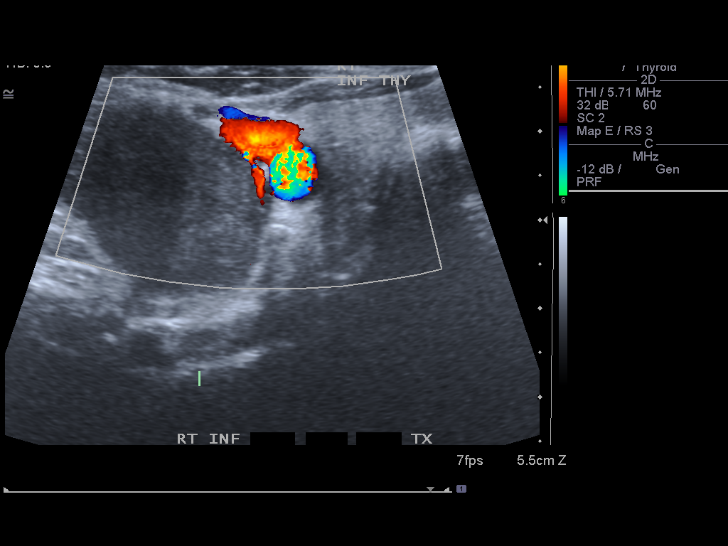

[13 of 25 positions shown; findings below may reference images not displayed]

FINDINGS: Parenchymal Echotexture: Normal

Estimated total number of nodules >/= 1 cm: 2

Number of spongiform nodules > 2 cm not described below (TR1): 0

Number of mixed cystic and solid nodules > 1.5 cm not described
below (TR2): 0

_________________________________________________________

Isthmus: 0.4 cm

Nodule # 1:

Location: Isthmus

Size: 1.2 cm x 0.6 cm x 0.9 cm

Composition: solid/almost completely solid (2)

Echogenicity: hypoechoic (2)

Shape: not taller-than-wide (0)

Margins: smooth (0)

Echogenic foci: none (0)

ACR TI-RADS total points: 4.

ACR TI-RADS risk category: TR4 (4-6 points).

ACR TI-RADS recommendations:

Nodule meets criteria for surveillance.

_________________________________________________________

Right lobe: 4.5 cm x 1.3 cm x 1.4 cm

Nodule # 1:

Location: Right; Inferior

Size: 1.1 cm x 0.6 cm x 0.6 cm

Composition: solid/almost completely solid (2)

Echogenicity: isoechoic (1)

Shape: not taller-than-wide (0)

Margins: smooth (0)

Echogenic foci: none (0)

ACR TI-RADS total points: 2.

ACR TI-RADS risk category: TR2 (2 points).

ACR TI-RADS recommendations:

Nodule does not meet criteria for surveillance or biopsy.

_________________________________________________________

Left lobe: 4.1 cm x 1.1 cm by 1.3 cm

Nodule # 1:

Location: Left;

Size: 0.6 cm x 0.3 cm x 0.5 cm

Composition: solid/almost completely solid (2)

Echogenicity: isoechoic (1)

Shape: not taller-than-wide (0)

Margins: smooth (0)

Echogenic foci: none (0)

ACR TI-RADS total points: 3.

ACR TI-RADS risk category: TR3 (3 points).

ACR TI-RADS recommendations:

Nodule does not meet criteria for biopsy or surveillance.

Additional: Rounded hypoechoic soft tissue nodule inferior and
lateral to the right thyroid lobe measuring as great as 3 cm. Nodule
was present on comparison CT study.
IMPRESSION: Multinodular thyroid.

No thyroid nodule meets criteria for biopsy or surveillance, as
designated by the newly established ACR TI-RADS criteria.

Recommendations follow those established by the new ACR TI-RADS
criteria ([HOSPITAL] 1912;[DATE]).

Soft tissue nodule inferior and lateral to the right thyroid lobe,
concerning for metastatic implant given the findings on recent CT
imaging and PET. This nodule is separate from the thyroid lobe.

## 2018-07-04 IMAGING — CT CT ANGIO CHEST
2 of 6 series · 18 of 36 positions shown · IV contrast (ISOVUE 370)
Comparison: Chest CT August 10, 2015 ; chest radiograph October 20, 2015

CLINICAL DATA: Shortness of breath.  Lung carcinoma.

EXAM:
CT ANGIOGRAPHY CHEST WITH CONTRAST
TECHNIQUE: Multidetector CT imaging of the chest was performed using the
standard protocol during bolus administration of intravenous
contrast. Multiplanar CT image reconstructions and MIPs were
obtained to evaluate the vascular anatomy.
CONTRAST:  100 mL Isovue 370 nonionic

[Series 6: coronal mpr · coronal · 0.56mm/px · 1 of 116 slices shown]
[im 58/116  mediastinal]
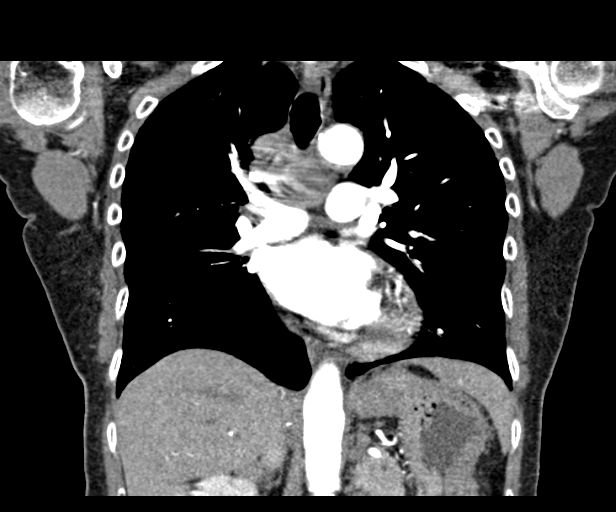

[Series 11: thins for pacs · axial · 0.61mm/px · z∈[+1241,+1477]mm · 17 of 264 slices shown]
[im 14/264  lung]
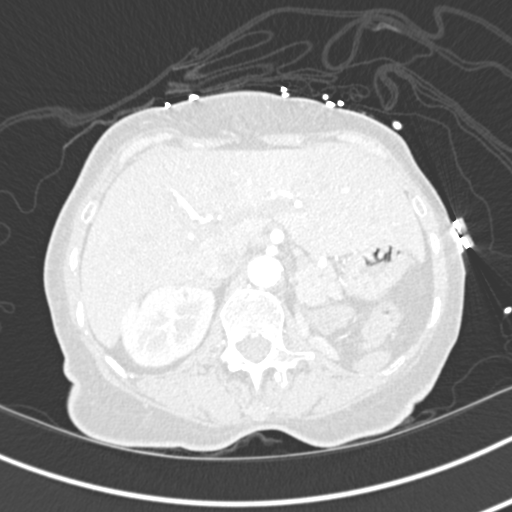
[im 27/264  mediastinal]
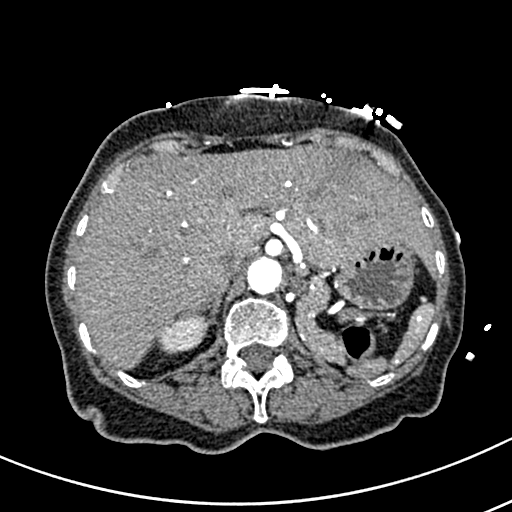
[im 40/264  lung]
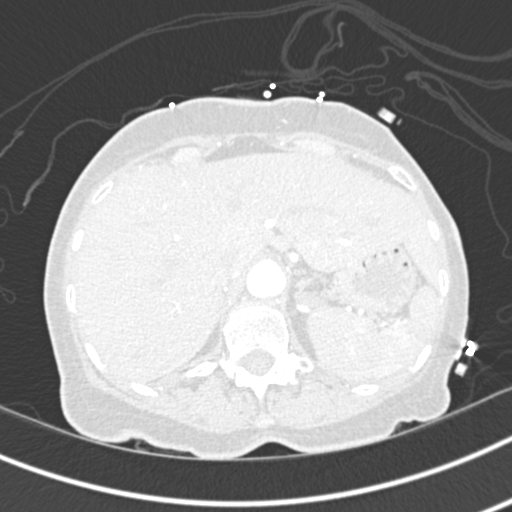
[im 53/264  mediastinal]
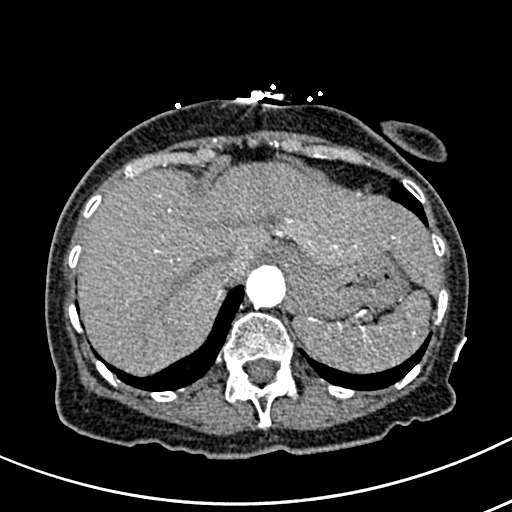
[im 79/264  lung]
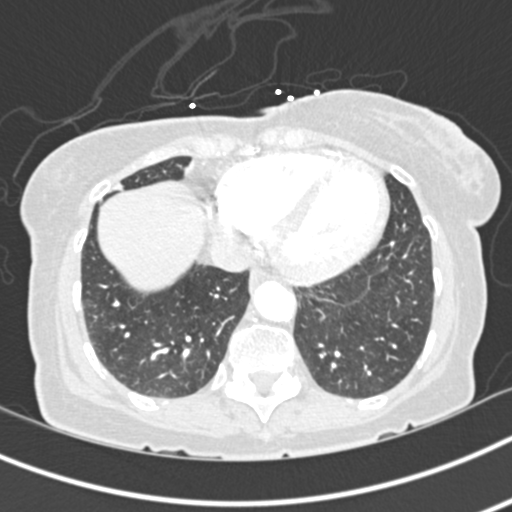
[im 93/264  mediastinal]
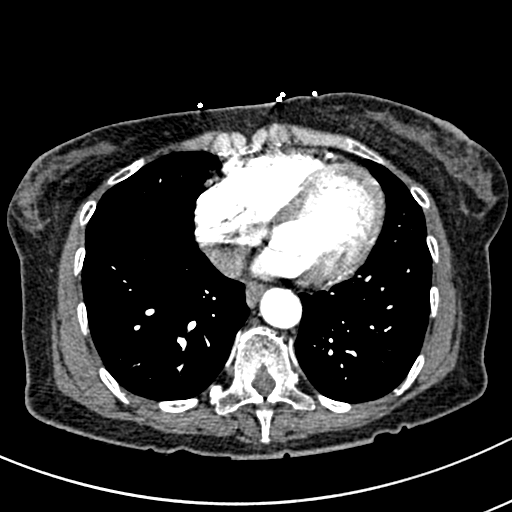
[im 106/264  lung]
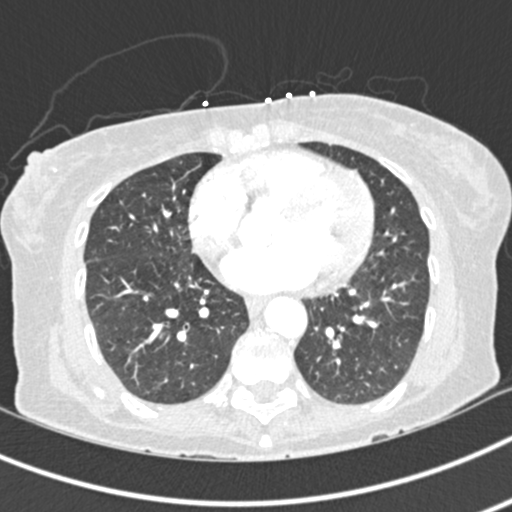
[im 119/264  mediastinal]
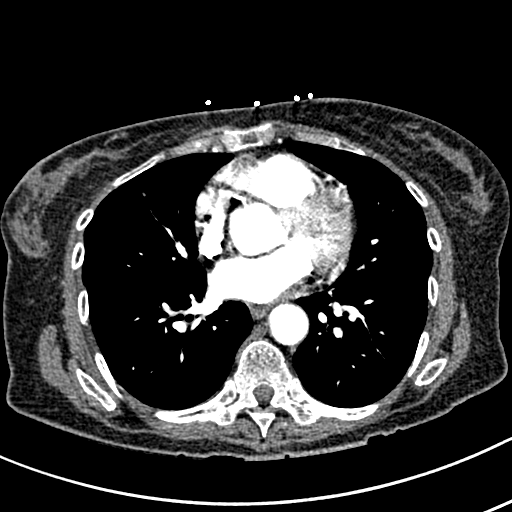
[im 132/264  lung]
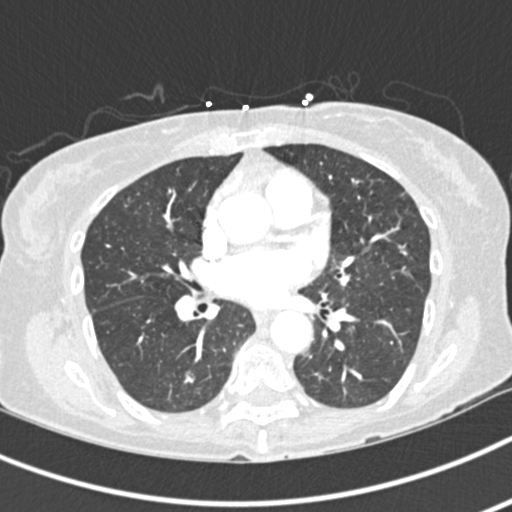
[im 145/264  mediastinal]
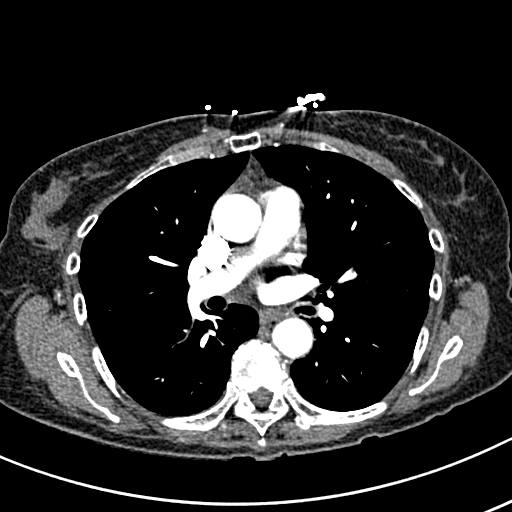
[im 158/264  lung]
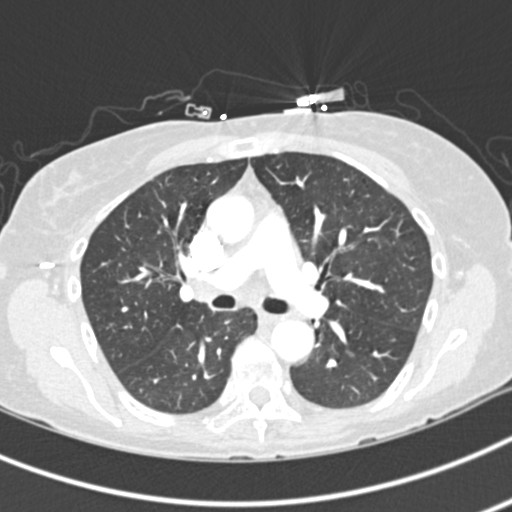
[im 171/264  mediastinal]
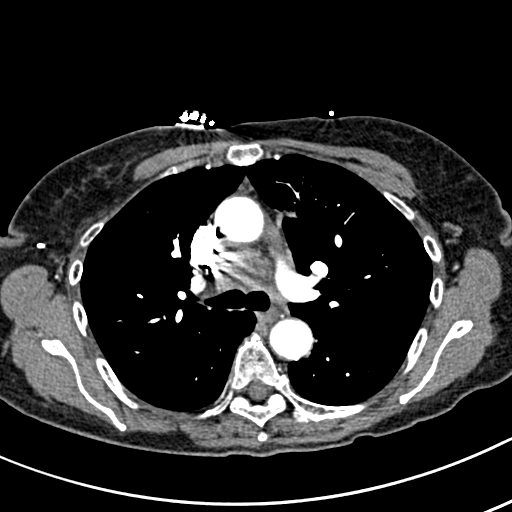
[im 185/264  lung]
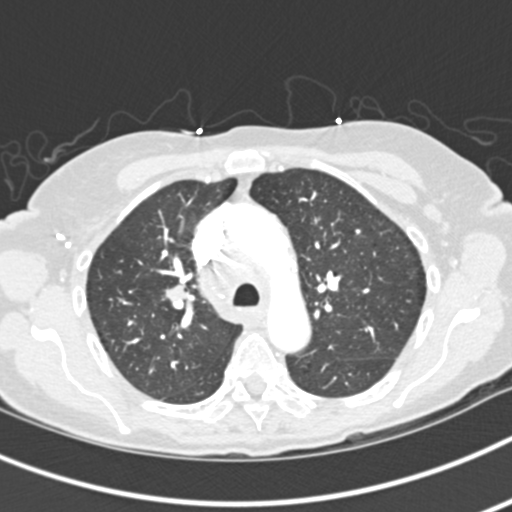
[im 211/264  mediastinal]
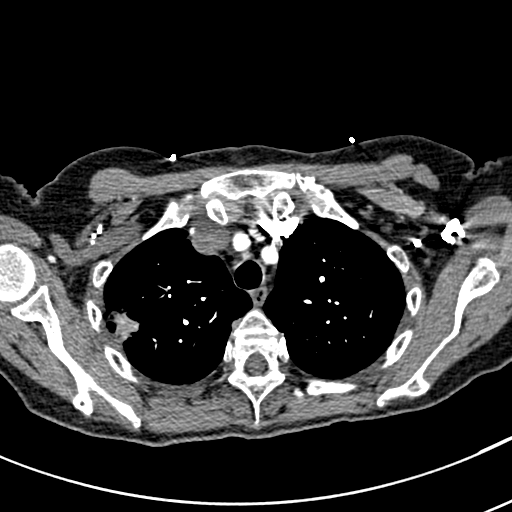
[im 224/264  lung]
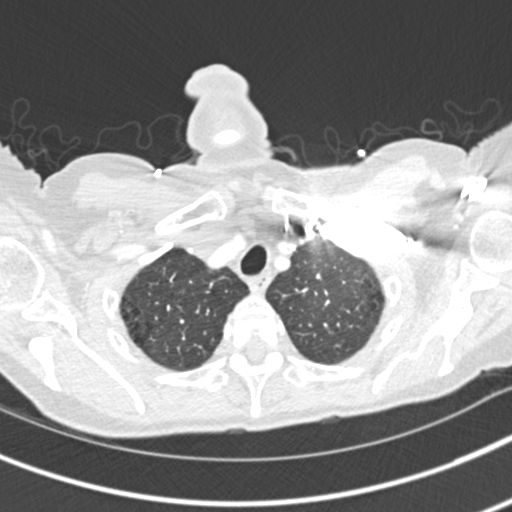
[im 237/264  mediastinal]
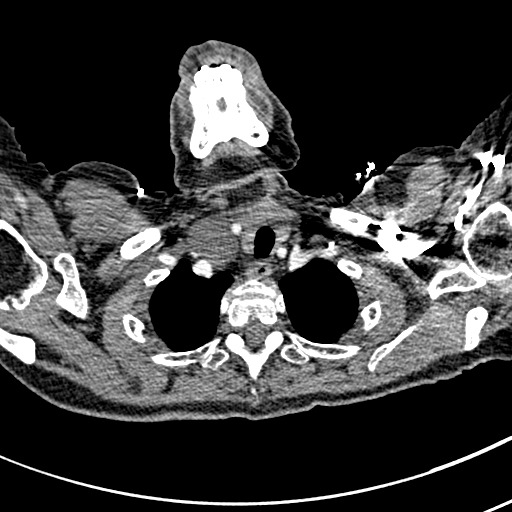
[im 250/264  lung]
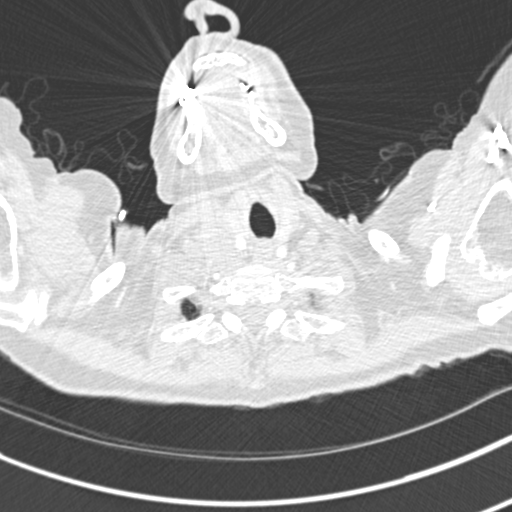

[18 of 36 positions shown; findings below may reference images not displayed]

FINDINGS: Cardiovascular: There is no demonstrable pulmonary embolus. There is
no thoracic aortic aneurysm or dissection. The visualized great
vessels appear unremarkable. There is atherosclerotic calcification
in the aortic arch region. There are multiple foci of coronary
artery calcification. The pericardium is not appreciably thickened.
There is left ventricular hypertrophy.

Mediastinum/Nodes: Thyroid appears unremarkable. There is adenopathy
to the right of the thyroid at the cervical -thoracic junction.
Adenopathy in this area measures 3.2 x 2.7 cm compared to 3.1 x
cm lymph node in this area on most recent prior study. There is
extensive adenopathy in the right pretracheal region with the
largest lymph node in this area measuring 2.5 x 2.7 cm, essentially
stable anterior to the carina, there is adenopathy measuring 2.9 x
2.7 cm, marginally increased from prior study. There is an enlarged
right hilar lymph node measuring 1.5 x 1.2 cm. There is a the sub-
carinal lymph node measuring 1.7 x 1.4 cm, stable.

Lungs/Pleura: There remains a mass in the right upper lobe
peripherally in the posterior segment near the junction with the
apical segment measuring 2.0 x 1.7 cm, stable. There is no new
parenchymal lung mass. There are several 3-4 mm nodular opacities
more inferiorly in the anterior segment of the right upper lobe.
There is no airspace consolidation.

Upper Abdomen: In the visualized upper abdomen, there is evidence of
prior left nephrectomy. There is a 1 cm cyst in the periphery of the
right kidney. No adrenal lesions are evident. Visualized liver
appears unremarkable.

Musculoskeletal: There is degenerative change in the thoracic spine.
There are no blastic or lytic bone lesions.

Review of the MIP images confirms the above findings.
IMPRESSION: No demonstrable pulmonary embolus.

Stable right upper lobe mass. Multiple foci of adenopathy with
overall marginal increase in adenopathy compared to most recent
prior study. No airspace consolidation.

There is atherosclerotic calcification in the aorta as well as foci
of coronary artery calcification. No thoracic aortic aneurysm or
dissection evident.

## 2018-07-04 IMAGING — CR DG CHEST 2V
2 series · 2 of 2 positions shown · non-contrast
Comparison: Chest CT 08/10/2015

CLINICAL DATA: Shortness of breath

EXAM:
CHEST  2 VIEW

[w chest pa]
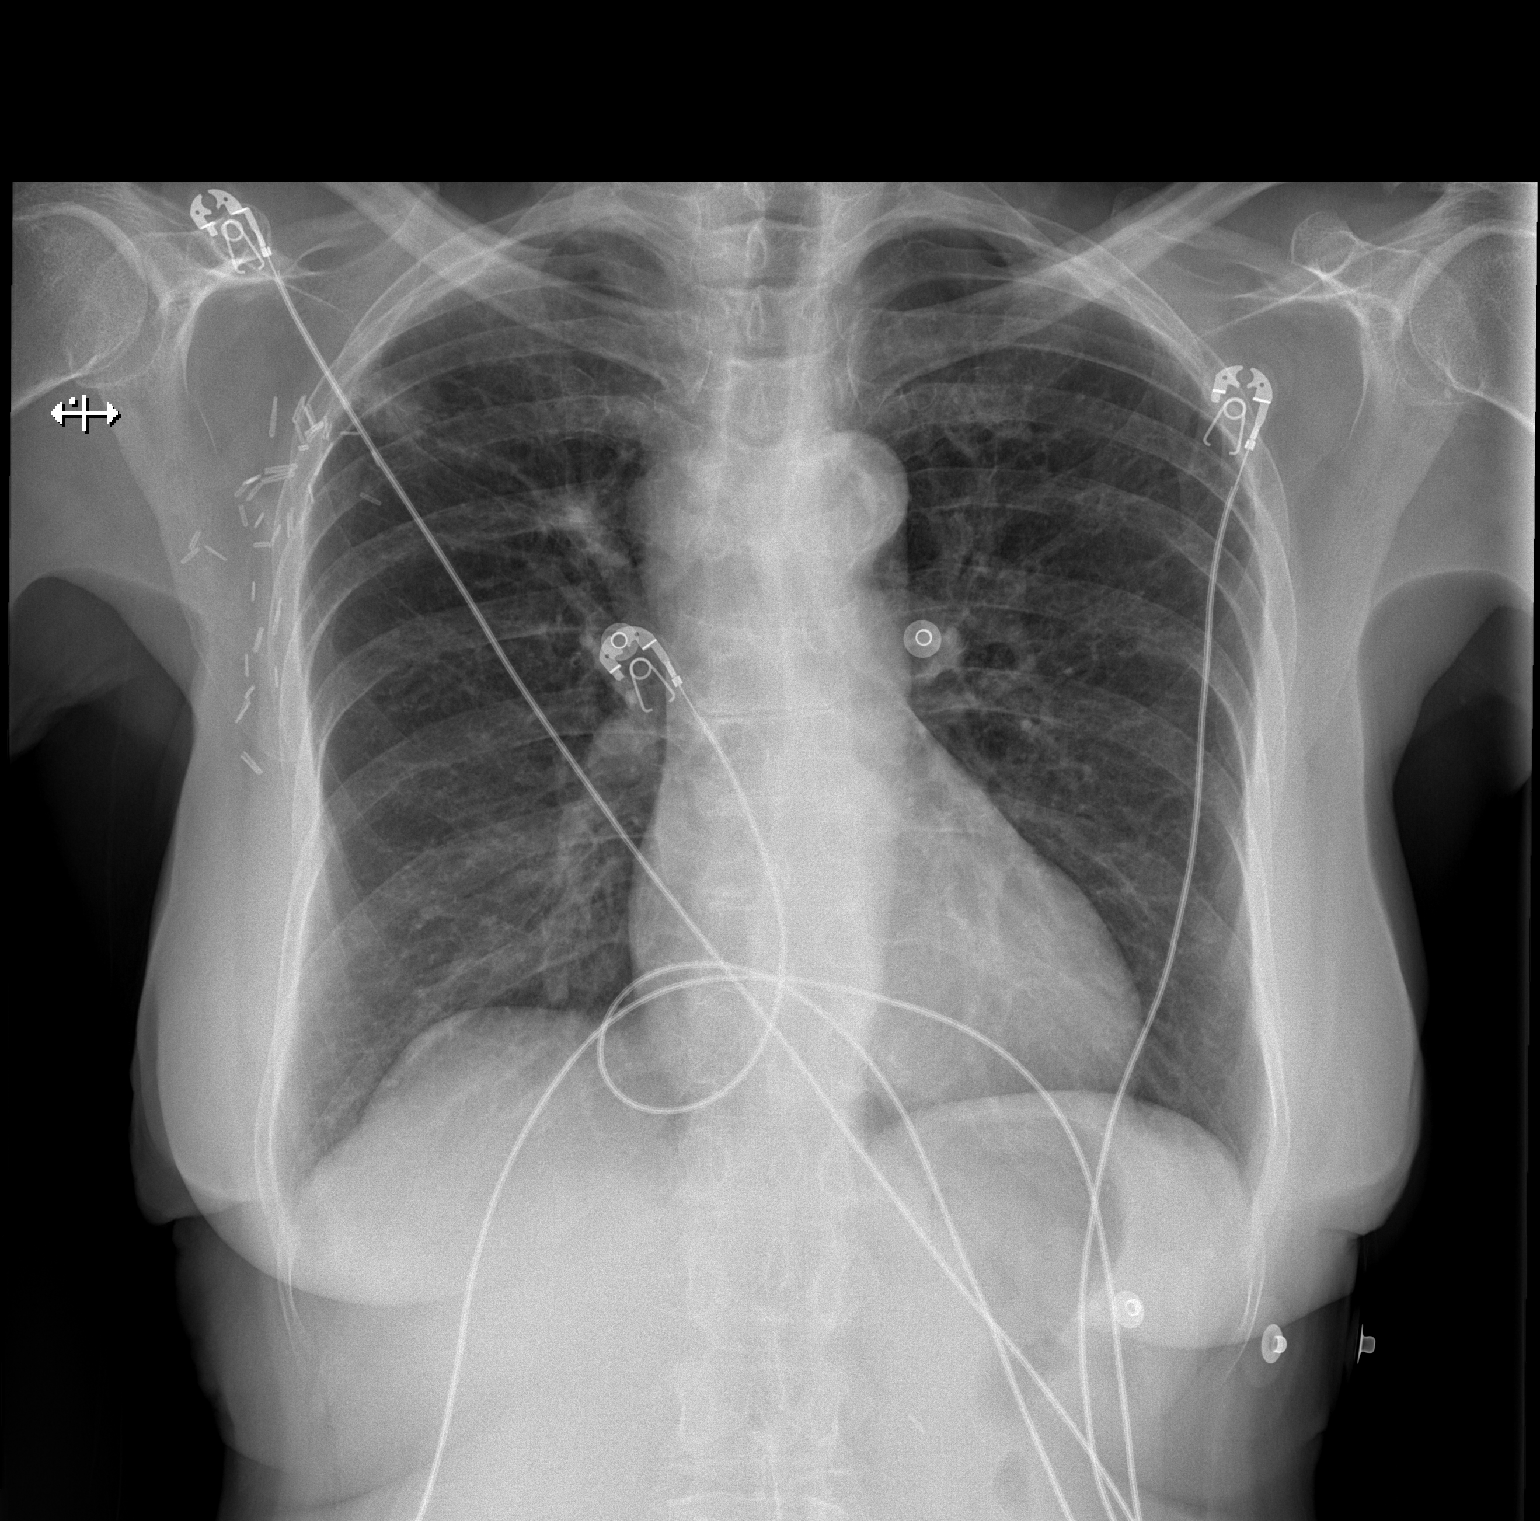

[w chest lat]
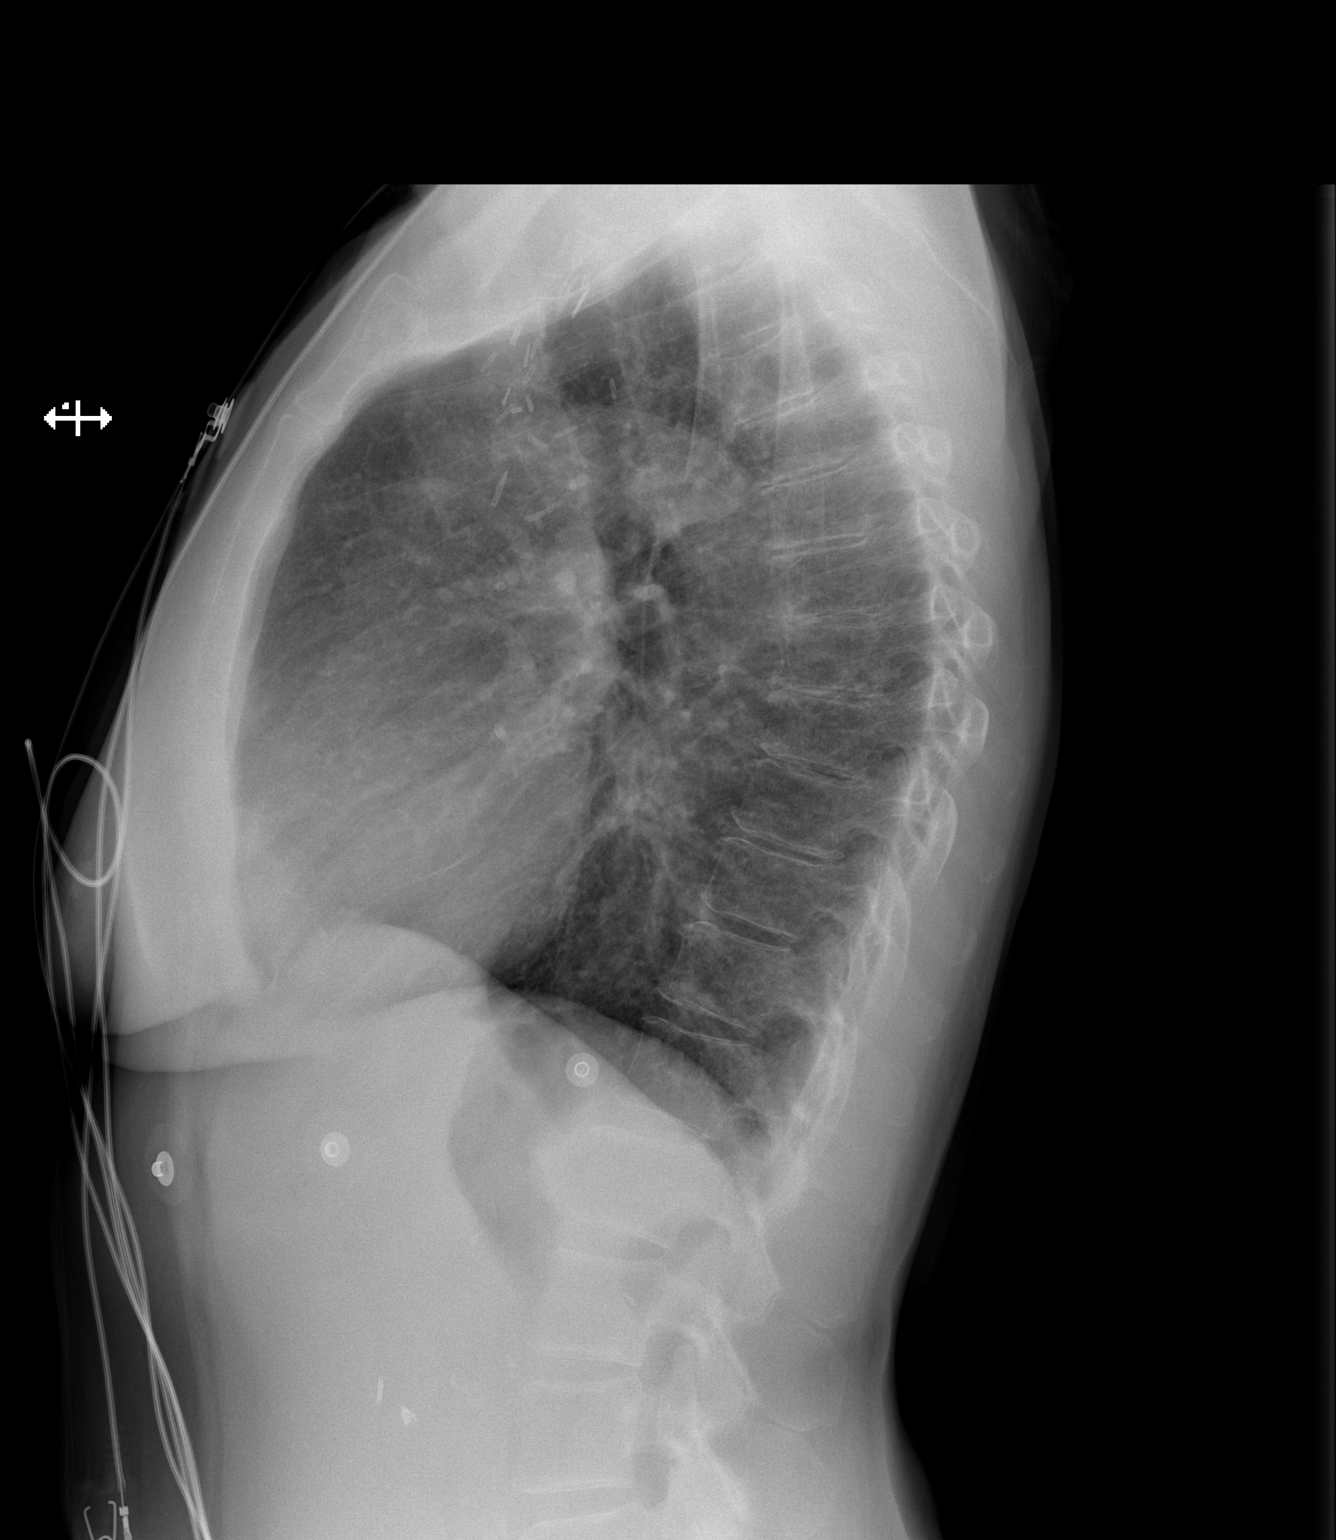

[2 of 2 positions shown; findings below may reference images not displayed]

FINDINGS: Normal heart size. Known right peritracheal adenopathy with stable
convexity. Known right upper lobe nodule. There is no edema,
consolidation, effusion, or pneumothorax. No acute osseous finding.
Postoperative right chest and axilla.
IMPRESSION: 1. No acute finding.
2. Known adenopathy and right upper lobe nodule.
